# Patient Record
Sex: Male | Born: 1935 | Race: Asian | Hispanic: No | Smoking: Former smoker
Health system: Southern US, Community
[De-identification: ages and names within clinical notes are randomized; demographics above are authoritative.]

## PROBLEM LIST (undated history)

## (undated) DIAGNOSIS — N189 Chronic kidney disease, unspecified: Secondary | ICD-10-CM

## (undated) DIAGNOSIS — D649 Anemia, unspecified: Secondary | ICD-10-CM

## (undated) DIAGNOSIS — K922 Gastrointestinal hemorrhage, unspecified: Secondary | ICD-10-CM

## (undated) DIAGNOSIS — N400 Enlarged prostate without lower urinary tract symptoms: Secondary | ICD-10-CM

## (undated) HISTORY — PX: OTHER SURGICAL HISTORY: SHX169

---

## 2013-01-25 ENCOUNTER — Encounter (HOSPITAL_COMMUNITY): Payer: Self-pay

## 2013-01-25 ENCOUNTER — Inpatient Hospital Stay (HOSPITAL_COMMUNITY): Payer: Medicaid Other

## 2013-01-25 ENCOUNTER — Inpatient Hospital Stay (HOSPITAL_COMMUNITY)
Admission: EM | Admit: 2013-01-25 | Discharge: 2013-01-30 | DRG: 683 | Disposition: A | Discharge status: Home Health | Payer: Medicaid Other | Attending: Family Medicine | Admitting: Family Medicine

## 2013-01-25 ENCOUNTER — Emergency Department (HOSPITAL_COMMUNITY): Payer: Medicaid Other

## 2013-01-25 DIAGNOSIS — R531 Weakness: Secondary | ICD-10-CM | POA: Diagnosis present

## 2013-01-25 DIAGNOSIS — R509 Fever, unspecified: Secondary | ICD-10-CM

## 2013-01-25 DIAGNOSIS — N10 Acute tubulo-interstitial nephritis: Secondary | ICD-10-CM | POA: Diagnosis present

## 2013-01-25 DIAGNOSIS — D649 Anemia, unspecified: Secondary | ICD-10-CM

## 2013-01-25 DIAGNOSIS — E538 Deficiency of other specified B group vitamins: Secondary | ICD-10-CM | POA: Diagnosis present

## 2013-01-25 DIAGNOSIS — N179 Acute kidney failure, unspecified: Secondary | ICD-10-CM

## 2013-01-25 DIAGNOSIS — D696 Thrombocytopenia, unspecified: Secondary | ICD-10-CM

## 2013-01-25 DIAGNOSIS — D509 Iron deficiency anemia, unspecified: Secondary | ICD-10-CM | POA: Diagnosis present

## 2013-01-25 DIAGNOSIS — N1330 Unspecified hydronephrosis: Secondary | ICD-10-CM | POA: Diagnosis present

## 2013-01-25 DIAGNOSIS — N138 Other obstructive and reflux uropathy: Secondary | ICD-10-CM | POA: Diagnosis present

## 2013-01-25 DIAGNOSIS — K819 Cholecystitis, unspecified: Secondary | ICD-10-CM | POA: Diagnosis present

## 2013-01-25 DIAGNOSIS — N289 Disorder of kidney and ureter, unspecified: Secondary | ICD-10-CM

## 2013-01-25 DIAGNOSIS — R339 Retention of urine, unspecified: Secondary | ICD-10-CM

## 2013-01-25 DIAGNOSIS — N320 Bladder-neck obstruction: Secondary | ICD-10-CM | POA: Diagnosis present

## 2013-01-25 DIAGNOSIS — F17200 Nicotine dependence, unspecified, uncomplicated: Secondary | ICD-10-CM | POA: Diagnosis present

## 2013-01-25 DIAGNOSIS — E872 Acidosis, unspecified: Secondary | ICD-10-CM

## 2013-01-25 DIAGNOSIS — Z79899 Other long term (current) drug therapy: Secondary | ICD-10-CM

## 2013-01-25 DIAGNOSIS — N390 Urinary tract infection, site not specified: Secondary | ICD-10-CM

## 2013-01-25 DIAGNOSIS — N12 Tubulo-interstitial nephritis, not specified as acute or chronic: Secondary | ICD-10-CM

## 2013-01-25 DIAGNOSIS — N189 Chronic kidney disease, unspecified: Secondary | ICD-10-CM | POA: Diagnosis present

## 2013-01-25 DIAGNOSIS — N401 Enlarged prostate with lower urinary tract symptoms: Secondary | ICD-10-CM | POA: Diagnosis present

## 2013-01-25 HISTORY — DX: Benign prostatic hyperplasia without lower urinary tract symptoms: N40.0

## 2013-01-25 LAB — URINALYSIS, ROUTINE W REFLEX MICROSCOPIC
Bilirubin Urine: NEGATIVE
Nitrite: POSITIVE — AB
Protein, ur: >300 mg/dL — AB
Urobilinogen, UA: 0.2 mg/dL (ref 0.0–1.0)

## 2013-01-25 LAB — CBC WITH DIFFERENTIAL/PLATELET
Basophils Absolute: 0 10*3/uL (ref 0.0–0.1)
Eosinophils Relative: 1 % (ref 0–5)
HCT: 27.9 % — ABNORMAL LOW (ref 39.0–52.0)
Hemoglobin: 9.4 g/dL — ABNORMAL LOW (ref 13.0–17.0)
Lymphocytes Relative: 6 % — ABNORMAL LOW (ref 12–46)
Lymphs Abs: 0.2 10*3/uL — ABNORMAL LOW (ref 0.7–4.0)
MCV: 90.3 fL (ref 78.0–100.0)
Monocytes Absolute: 0 10*3/uL — ABNORMAL LOW (ref 0.1–1.0)
Monocytes Relative: 1 % — ABNORMAL LOW (ref 3–12)
Neutro Abs: 3.2 10*3/uL (ref 1.7–7.7)
RBC: 3.09 MIL/uL — ABNORMAL LOW (ref 4.22–5.81)
RDW: 13.6 % (ref 11.5–15.5)
WBC: 3.4 10*3/uL — ABNORMAL LOW (ref 4.0–10.5)

## 2013-01-25 LAB — IRON AND TIBC: Iron: <10 ug/dL — ABNORMAL LOW (ref 42–135)

## 2013-01-25 LAB — VITAMIN B12: Vitamin B-12: 251 pg/mL (ref 211–911)

## 2013-01-25 LAB — COMPREHENSIVE METABOLIC PANEL
AST: 20 U/L (ref 0–37)
CO2: 15 mEq/L — ABNORMAL LOW (ref 19–32)
Calcium: 7.9 mg/dL — ABNORMAL LOW (ref 8.4–10.5)
Chloride: 113 mEq/L — ABNORMAL HIGH (ref 96–112)
Creatinine, Ser: 2.21 mg/dL — ABNORMAL HIGH (ref 0.50–1.35)
GFR calc Af Amer: 30 mL/min — ABNORMAL LOW (ref >90)
GFR calc non Af Amer: 26 mL/min — ABNORMAL LOW (ref >90)
Glucose, Bld: 76 mg/dL (ref 70–99)
Total Bilirubin: 0.5 mg/dL (ref 0.3–1.2)

## 2013-01-25 LAB — FOLATE: Folate: 7.6 ng/mL

## 2013-01-25 LAB — RETICULOCYTES: Retic Count, Absolute: 35.6 10*3/uL (ref 19.0–186.0)

## 2013-01-25 LAB — URINE MICROSCOPIC-ADD ON

## 2013-01-25 LAB — FERRITIN: Ferritin: 57 ng/mL (ref 22–322)

## 2013-01-25 MED ORDER — ACETAMINOPHEN 325 MG PO TABS
650.0000 mg | ORAL_TABLET | Freq: Four times a day (QID) | ORAL | Status: DC | PRN
  Filled 2013-01-25 (×3): qty 2

## 2013-01-25 MED ORDER — OXYCODONE HCL 5 MG PO TABS
5.0000 mg | ORAL_TABLET | ORAL | Status: DC | PRN
  Administered 2013-01-26 – 2013-01-27 (×3): 5 mg via ORAL
  Filled 2013-01-25 (×2): qty 1
  Filled 2013-01-25: qty 2
  Filled 2013-01-25: qty 1

## 2013-01-25 MED ORDER — SODIUM CHLORIDE 0.9 % IV BOLUS (SEPSIS)
1000.0000 mL | Freq: Once | INTRAVENOUS | Status: AC
  Administered 2013-01-25: 1000 mL via INTRAVENOUS

## 2013-01-25 MED ORDER — ACETAMINOPHEN 325 MG PO TABS
650.0000 mg | ORAL_TABLET | ORAL | Status: DC | PRN
  Administered 2013-01-26 – 2013-01-29 (×4): 650 mg via ORAL
  Filled 2013-01-25 (×2): qty 2

## 2013-01-25 MED ORDER — SODIUM CHLORIDE 0.9 % IV SOLN
INTRAVENOUS | Status: DC
  Administered 2013-01-25 – 2013-01-28 (×6): via INTRAVENOUS

## 2013-01-25 MED ORDER — ONDANSETRON HCL 4 MG/2ML IJ SOLN
4.0000 mg | Freq: Once | INTRAMUSCULAR | Status: AC
  Administered 2013-01-25: 4 mg via INTRAVENOUS
  Filled 2013-01-25: qty 2

## 2013-01-25 MED ORDER — ONDANSETRON HCL 4 MG PO TABS: 4.0000 mg | ORAL_TABLET | Freq: Four times a day (QID) | ORAL | Status: DC | PRN

## 2013-01-25 MED ORDER — SODIUM CHLORIDE 0.9 % IV SOLN
Freq: Once | INTRAVENOUS | Status: AC
  Administered 2013-01-25: 05:00:00 via INTRAVENOUS

## 2013-01-25 MED ORDER — SODIUM CHLORIDE 0.9 % IV SOLN: INTRAVENOUS | Status: AC

## 2013-01-25 MED ORDER — DEXTROSE 5 % IV SOLN
1.0000 g | INTRAVENOUS | Status: DC
  Administered 2013-01-25 – 2013-01-26 (×2): 1 g via INTRAVENOUS
  Filled 2013-01-25 (×2): qty 10

## 2013-01-25 MED ORDER — ALUM & MAG HYDROXIDE-SIMETH 200-200-20 MG/5ML PO SUSP: 30.0000 mL | Freq: Four times a day (QID) | ORAL | Status: DC | PRN

## 2013-01-25 MED ORDER — ACETAMINOPHEN 650 MG RE SUPP: 650.0000 mg | Freq: Four times a day (QID) | RECTAL | Status: DC | PRN

## 2013-01-25 MED ORDER — IBUPROFEN 400 MG PO TABS
400.0000 mg | ORAL_TABLET | ORAL | Status: DC | PRN
  Filled 2013-01-25: qty 1

## 2013-01-25 MED ORDER — DEXTROSE 5 % IV SOLN: 1.0000 g | INTRAVENOUS | Status: DC

## 2013-01-25 MED ORDER — IBUPROFEN 400 MG PO TABS
400.0000 mg | ORAL_TABLET | Freq: Once | ORAL | Status: AC
  Administered 2013-01-25: 400 mg via ORAL
  Filled 2013-01-25: qty 1

## 2013-01-25 MED ORDER — HYDROMORPHONE HCL PF 1 MG/ML IJ SOLN: 0.5000 mg | INTRAMUSCULAR | Status: DC | PRN

## 2013-01-25 MED ORDER — ONDANSETRON HCL 4 MG/2ML IJ SOLN: 4.0000 mg | Freq: Four times a day (QID) | INTRAMUSCULAR | Status: DC | PRN

## 2013-01-25 MED ORDER — ZOLPIDEM TARTRATE 5 MG PO TABS: 5.0000 mg | ORAL_TABLET | Freq: Every evening | ORAL | Status: DC | PRN

## 2013-01-25 MED ORDER — ENOXAPARIN SODIUM 30 MG/0.3ML ~~LOC~~ SOLN
30.0000 mg | SUBCUTANEOUS | Status: DC
  Administered 2013-01-25 – 2013-01-29 (×5): 30 mg via SUBCUTANEOUS
  Filled 2013-01-25 (×6): qty 0.3

## 2013-01-25 NOTE — Progress Notes (Signed)
[facility] PROGRESS NOTE  Martin Tanner VHQ:[id number] DOB: Apr 24, 1928 DOA: 01/25/2013 PCP: Pcp Not In System  Assessment/Plan: Principal Problem:  *ARF (acute renal failure) Active Problems:  UTI (lower urinary tract infection)  Urinary retention  Anemia  Weakness generalized    1. ARF: Patient presented with increased lethargy, poor oral intake for a few days, followed by vomiting on 01/24/13. Creatinine was 2.2, BUN 38 at presentation, consistent with ARF, although baseline creatinine is unknown at this time. Perhaps, he does have underlying CKD, given known history of prostatism. Managing with iv fluids. Renal US to evaluate for obstructive uropathy, is pending. Avoiding nephrotoxins.  2. Acute Pyelonephritis/UTI: Patient has had a few days of fever, chills, and a temperature of 102.9 was documented in the ED. Urinalysis revealed a positive sediment, with pyuria and significant bacteriuria. Likely, patient has an acute pyelonephritis. On iv Rcephin, day#2. Urine cultures are pending. Will send off blood cultures.  3. Urinary Retention: Apparently, patient has had features of prostatism in the past, as well as an episode of urinary retention, requiring bladder catheterization about a year ago. Voiding with some hesitancy, since then. This is likely due to due to BPH. PSA is pending. Will consider Flomax if symptomatic, and referral to Urology as outpatient, if needed.  4. Anemia: This is mild-moderate normocytic. Anemia panel is pending, as is FOBT.  5. Generalized Weakness: Due to #s 1, 2 & 3.    Code Status: Full Code.  Family Communication:  Discussed fully with grandson t bedside, who also acted as Equities trader.  Disposition Plan: To be determined.    Brief narrative: 79 y.o. male who was brought to the ED by his family due to fevers, chills and low back pain x 2 days. He has had increased weakness and lethargy with decreased intake of foods and liquids. He also had nausea and vomiting  X 1 this am. The history was obtained from the family who were at the bedside, because the patient does not speak any english, he lived in [place] until 2 months ago. His family reports that he had kidney or bladder problems in the past and had to have a tube placed in his bladder so he could pass his urine, and at times he still has problems passing his urine. In the ED he was found to have a UTI, and also an elevated BUN and Cr 38/2.2. He had a fever to 102.9 in the ED, was placed on iv Rocephin and admitted for further management.     Consultants:  N/A.   Procedures:  CXR.   Antibiotics:  Rocephin 01/24/13>>>  HPI/Subjective: No new issues.   Objective: Vital signs in last 24 hours: Temp:  [98.4 F (36.9 C)-102.8 F (39.3 C)] 98.7 F (37.1 C) (02/02 0606) Pulse Rate:  [90-95] 90  (02/02 0606) Resp:  [19-24] 19  (02/02 0606) BP: (91-109)/(52-71) 91/52 mmHg (02/02 0606) SpO2:  [94 %-99 %] 99 % (02/02 0606) Weight:  [54 kg (119 lb 0.8 oz)-54.432 kg (120 lb)] 54 kg (119 lb 0.8 oz) (02/02 0606) Weight change:     Intake/Output from previous day:       Physical Exam: General: Comfortable, alert, communicative, fully oriented, not short of breath at rest.  HEENT:  Mild clinical pallor, no jaundice, no conjunctival injection or discharge. NECK:  Supple, JVP not seen, no carotid bruits, no palpable lymphadenopathy, no palpable goiter. CHEST:  Clinically clear to auscultation, no wheezes, no crackles. HEART:  Sounds 1 and 2 heard,  normal, regular, no murmurs. ABDOMEN:  Flat, soft, non-tender, no palpable organomegaly, no palpable masses, normal bowel sounds. GENITALIA:  Not examined. LOWER EXTREMITIES:  No pitting edema, palpable peripheral pulses. MUSCULOSKELETAL SYSTEM:  Generalized osteoarthritic changes, otherwise, normal. CENTRAL NERVOUS SYSTEM:  No focal neurologic deficit on gross examination.  Lab Results:  [facility] 01/25/13 0254  WBC 3.4*  HGB 9.4*  HCT 27.9*  PLT  121*    Basename 01/25/13 0254  NA 138  K 4.5  CL 113*  CO2 15*  GLUCOSE 76  BUN 38*  CREATININE 2.21*  CALCIUM 7.9*   No results found for this or any previous visit (from the past 240 hour(s)).   Studies/Results: Dg Chest 2 View  01/25/2013  *RADIOLOGY REPORT*  Clinical Data: Fever, emesis, low back pain for 2 days.  CHEST - 2 VIEW  Comparison: None.  Findings: Borderline heart size and pulmonary vascularity.  No focal airspace consolidation in the lungs.  Scattered interstitial changes likely representing fibrosis.  No blunting of costophrenic angles.  No pneumothorax.  Mediastinal contours appear intact. Calcification of the aorta.  Degenerative changes in the thoracic spine.  IMPRESSION: Borderline heart size and pulmonary vascularity.  No focal consolidation.   Original Report Authenticated By: Burman Nieves, M.D.     Medications: Scheduled Meds:   . sodium chloride   Intravenous STAT  . cefTRIAXone (ROCEPHIN) IVPB 1 gram/50 mL D5W  1 g Intravenous Q24H  . enoxaparin (LOVENOX) injection  30 mg Subcutaneous Q24H   Continuous Infusions:   . sodium chloride     PRN Meds:.acetaminophen, acetaminophen, acetaminophen, alum & mag hydroxide-simeth, HYDROmorphone (DILAUDID) injection, ibuprofen, ondansetron (ZOFRAN) IV, ondansetron, oxyCODONE, zolpidem    LOS: 0 days   Othello Dickenson,CHRISTOPHER  [facility] Pager [phone]. If 8PM-8AM, please contact night-coverage at [email or web address], password [facility] 01/25/2013, 7:49 AM  LOS: 0 days

## 2013-01-25 NOTE — Progress Notes (Signed)
Text placed to Dr. Conley Rolls, floor coverage about bilat hydronephrosis.

## 2013-01-25 NOTE — H&P (Signed)
[facility] History and Physical  Martin Tanner WUJ:[id number] DOB: 1928/09/14 DOA: 01/25/2013   Assessment/Plan: Present on Admission:  . ARF (acute renal failure) . UTI (lower urinary tract infection) . Urinary retention . Anemia . Weakness generalized   1.    ARF-  Acute versus Chronic, has a element of Prerenal,  Monitor BUN/Cr for improvement with hydration.   Renal US to evaluate for BOO, and hydronephrosis.     2.    UTI- Urine C+S sent, IV Rocephin for now, adjust PRN Culture Results.     3.    Urinary Retention-  Possibly due to BPH,   Check PSA, and consider Flomax if Blood Pressure permits, and referral tyo Urology as outpatient if needed.     4.    Anemia-  Send Anemia panel, and check FOBT daily X 3.       5.    Generalized Weakness-  Due to # 2, and # 1, should improve as illness resolves.     6.    Other-  DVT prophylaxis with Lovenox.       Referring physician: EDP PCP: Pcp Not In System  Specialists:   Chief Complaint: Weakness, Fever and Back Pain,  HPI: Martin Tanner is a 79 y.o. male who was brought to the ED by his family due to fevers and chills and low back pain x 2 days.   He has had increased weakness and lethargy with decreased intake of foods and liquids.  He also had nausea and vomiting X 1 this am.   The history was obtained from the family who are at the bedside because the patient does not speak any english, he lived in [place] until 2 months ago.   His family reports that he had kidney or bladder problems in the past and had to have a tube placed in his bladder so he could pass his urine, and at times he still has problems passing his urine.  In the ED he was found to have a UTI, and also an elevated BUN and Cr  38/2.2.   He had a fever to 102.9 in the ED, and he was placed on IV rocephin and referred for admission.          Review of Systems:    Positive for:   anorexia, fever, chills, nausea and vomiting X1, muscle weakness, urinary hesitation, urinary  retention The patient denies, weight loss, vision loss, decreased hearing, hoarseness, chest pain, syncope, dyspnea on exertion, peripheral edema, balance deficits, hemoptysis, abdominal pain, melena, hematochezia, severe indigestion/heartburn, hematuria, incontinence, genital sores, suspicious skin lesions, transient blindness, difficulty walking, depression, unusual weight change, abnormal bleeding, enlarged lymph nodes, angioedema, and breast masses.    Past Medical History  Diagnosis Date  . BPH (benign prostatic hyperplasia)    Past Surgical History  Procedure Date  . None     Medications:  HOME MEDS: Prior to Admission medications   Medication Sig Start Date End Date Taking? Authorizing Provider  acetaminophen (TYLENOL) 325 MG tablet Take 650 mg by mouth every 6 (six) hours as needed. For pain   Yes Historical Provider, MD    Allergies:  No Known Allergies  Social History:   reports that he has been smoking Cigarettes.  He has a 3.1 pack-year smoking history. He has never used smokeless tobacco. He reports that he does not drink alcohol or use illicit drugs.  Family History: Family History  Problem Relation Age of Onset  . Other  Negative CAD  . Other      Negative DM2  . Other      Negative HTN  . Other      Negative Cancer     Physical Exam:      GEN:  Pleasant Elderly 79 year old thin Elderly Asian Male examined  and in no acute distress; cooperative with exam Filed Vitals:   01/25/13 0225 01/25/13 0233 01/25/13 0248 01/25/13 0424  BP: 109/71   98/58  Pulse: 95   93  Temp: 102.8 F (39.3 C)   98.4 F (36.9 C)  TempSrc: Rectal   Oral  Resp: 20   24  Height:   4\' 9"  (1.448 m)   Weight:   54.432 kg (120 lb)   SpO2: 94% 95%  97%  Blood pressure 98/58, pulse 93, temperature 98.4 F (36.9 C), temperature source Oral, resp. rate 24, height 4\' 9"  (1.448 m), weight 54.432 kg (120 lb), SpO2 97.00%. PSYCH: He is alert and oriented x4; does not appear  anxious does not appear depressed; affect is normal HEENT: Normocephalic and Atraumatic, Mucous membranes pink; PERRLA; EOM intact; Fundi:  Benign;  No scleral icterus, Nares: Patent, Oropharynx: Clear, Poor Sparse Dentition, Neck:  FROM, no cervical lymphadenopathy nor thyromegaly or carotid bruit; no JVD; Breasts:: Not examined CHEST WALL: No tenderness CHEST: Normal respiration, clear to auscultation bilaterally HEART: Regular rate and rhythm; no murmurs rubs or gallops BACK: No kyphosis or scoliosis; no CVA tenderness ABDOMEN: Positive Bowel Sounds, Scaphoid,soft non-tender; no masses, no organomegaly.   Rectal Exam: Not done EXTREMITIES: No cyanosis, clubbing or edema; no ulcerations. Genitalia: not examined PULSES: 2+ and symmetric SKIN: Normal hydration no rash or ulceration CNS: Cranial nerves 2-12 grossly intact no focal neurologic deficit    Labs on Admission:  Basic Metabolic Panel:  Lab 01/25/13 1610  NA 138  K 4.5  CL 113*  CO2 15*  GLUCOSE 76  BUN 38*  CREATININE 2.21*  CALCIUM 7.9*  MG --  PHOS --   Liver Function Tests:  Lab 01/25/13 0254  AST 20  ALT 10  ALKPHOS 72  BILITOT 0.5  PROT 6.5  ALBUMIN 2.9*   No results found for this basename: LIPASE:5,AMYLASE:5 in the last 168 hours No results found for this basename: AMMONIA:5 in the last 168 hours CBC:  Lab 01/25/13 0254  WBC 3.4*  NEUTROABS 3.2  HGB 9.4*  HCT 27.9*  MCV 90.3  PLT 121*   Cardiac Enzymes: No results found for this basename: CKTOTAL:5,CKMB:5,CKMBINDEX:5,TROPONINI:5 in the last 168 hours  BNP (last 3 results) No results found for this basename: PROBNP:3 in the last 8760 hours CBG: No results found for this basename: GLUCAP:5 in the last 168 hours  Radiological Exams on Admission: Dg Chest 2 View  01/25/2013  *RADIOLOGY REPORT*  Clinical Data: Fever, emesis, low back pain for 2 days.  CHEST - 2 VIEW  Comparison: None.  Findings: Borderline heart size and pulmonary vascularity.   No focal airspace consolidation in the lungs.  Scattered interstitial changes likely representing fibrosis.  No blunting of costophrenic angles.  No pneumothorax.  Mediastinal contours appear intact. Calcification of the aorta.  Degenerative changes in the thoracic spine.  IMPRESSION: Borderline heart size and pulmonary vascularity.  No focal consolidation.   Original Report Authenticated By: Burman Nieves, M.D.      Assessment/Plan: Present on Admission:  . ARF (acute renal failure) . UTI (lower urinary tract infection) . Urinary retention . Anemia . Weakness generalized  1.    ARF-  Acute versus Chronic, has a element of Prerenal,  Monitor BUN/Cr for improvement with hydration.   Renal US to evaluate for BOO, and hydronephrosis.     2.    UTI- Urine C+S sent, IV Rocephin for now, adjust PRN Culture Results.     3.    Urinary Retention-  Possibly due to BPH,   Check PSA, and consider Flomax, and referral tyo Urology as outpatient if needed.     4.    Anemia-  Send Anemia panel, and check FOBT daily X 3.       5.    Generalized Weakness-  Due to # 2, and # 1, should improve as illness resolves.     6.    Other-  DVT prophylaxis with Lovenox.          Code Status:   FULL CODE Family Communication:  Family at Bedside Disposition Plan:   Return to Home on Discharge  Time spent:  44 Minutes  Ron Parker [facility] Pager [phone]  If 7PM-7AM, please contact night-coverage [email or web address] Password [facility] 01/25/2013, 5:39 AM

## 2013-01-25 NOTE — Progress Notes (Signed)
Dr. Andrey Campanile from radiology called and stated pt has bilat. Hydronephrosis.

## 2013-01-25 NOTE — ED Notes (Signed)
Per EMS:  The patient c/o flulike symptoms and fever x 24 hours.  He also c/o lower back pain yesterday.  EMS gave Zofran 4 mg, iv and NS , iv en route, and the patient took acetaminophen per the directions on the box at home at 0130.

## 2013-01-25 NOTE — Progress Notes (Signed)
Nocturnist:  Called about [place] with bilateral hydronephrosis.  Chart reviewed, admitted for pyelo, and in ARF Cr 2.2 unknown baseline.  I spoke with Dr Desmond Dike, who reviewed the chart as well, and feel that its likely an obtructive process below the bladder.  He did have BPH prior.  He recommended an 18 Fr size foley to be placed, and a NON CONTRAST abd pelvic CT.  He will see patient in the morning for further recommendation.  I spoke with Dr Brien Few as well tonight.      Dr Conley Rolls.

## 2013-01-25 NOTE — ED Provider Notes (Signed)
History     CSN: [id number]  Arrival date & time 01/25/13  0213   First MD Initiated Contact with Patient 01/25/13 [phone]      Chief Complaint  Patient presents with  . Flulike Symptoms   . Fever    (Consider location/radiation/quality/duration/timing/severity/associated sxs/prior treatment) Patient is a 79 y.o. male presenting with fever. The history is provided by the patient and a relative.  Fever Primary symptoms of the febrile illness include fever.  He had onset yesterday of fever and chills. He did not take his temperature at home. There has been a cough productive of sputum but he did not notice color. There's been nausea and vomiting as well as post tussive emesis. There's been no diarrhea. He denies rhinorrhea or sore throat. He denies arthralgias and myalgias although it had some mild lower back pain yesterday. He did take acetaminophen for fever. He is feeling generally weak. There's been no sick contacts. He did not receive an influenza vaccination this season.  No past medical history on file.  No past surgical history on file.  No family history on file.  History  Substance Use Topics  . Smoking status: Current Every Day Smoker -- 0.0 packs/day for 62 years    Types: Cigarettes  . Smokeless tobacco: Never Used  . Alcohol Use: No      Review of Systems  Constitutional: Positive for fever.  All other systems reviewed and are negative.    Allergies  Review of patient's allergies indicates no known allergies.  Home Medications  No current outpatient prescriptions on file.  BP 109/71  Pulse 95  Temp 102.8 F (39.3 C) (Rectal)  Resp 20  SpO2 95%  Physical Exam  Nursing note and vitals reviewed.  79 year old male, resting comfortably and in no acute distress. Vital signs are significant for fever with temperature 102.8. Oxygen saturation is 95%, which is normal. Head is normocephalic and atraumatic. PERRLA, EOMI. Oropharynx is clear. Neck is nontender  and supple without adenopathy or JVD. Back is nontender and there is no CVA tenderness. Lungs are clear without rales, wheezes, or rhonchi. Chest is nontender. Heart has regular rate and rhythm without murmur. Abdomen is soft, flat, nontender without masses or hepatosplenomegaly and peristalsis is normoactive. Extremities have no cyanosis or edema, full range of motion is present. Skin is warm and dry without rash. Neurologic: Mental status is normal, cranial nerves are intact, there are no motor or sensory deficits.  ED Course  Procedures (including critical care time)  Results for orders placed during the hospital encounter of 01/25/13  CBC WITH DIFFERENTIAL      Component Value Range   WBC 3.4 (*) 4.0 - 10.5 K/uL   RBC 3.09 (*) 4.22 - 5.81 MIL/uL   Hemoglobin 9.4 (*) 13.0 - 17.0 g/dL   HCT 11.9 (*) 14.7 - 82.9 %   MCV 90.3  78.0 - 100.0 fL   MCH 30.4  26.0 - 34.0 pg   MCHC 33.7  30.0 - 36.0 g/dL   RDW 56.2  13.0 - 86.5 %   Platelets 121 (*) 150 - 400 K/uL   Neutrophils Relative 92 (*) 43 - 77 %   Neutro Abs 3.2  1.7 - 7.7 K/uL   Lymphocytes Relative 6 (*) 12 - 46 %   Lymphs Abs 0.2 (*) 0.7 - 4.0 K/uL   Monocytes Relative 1 (*) 3 - 12 %   Monocytes Absolute 0.0 (*) 0.1 - 1.0 K/uL   Eosinophils Relative  1  0 - 5 %   Eosinophils Absolute 0.0  0.0 - 0.7 K/uL   Basophils Relative 0  0 - 1 %   Basophils Absolute 0.0  0.0 - 0.1 K/uL  COMPREHENSIVE METABOLIC PANEL      Component Value Range   Sodium 138  135 - 145 mEq/L   Potassium 4.5  3.5 - 5.1 mEq/L   Chloride 113 (*) 96 - 112 mEq/L   CO2 15 (*) 19 - 32 mEq/L   Glucose, Bld 76  70 - 99 mg/dL   BUN 38 (*) 6 - 23 mg/dL   Creatinine, Ser 1.61 (*) 0.50 - 1.35 mg/dL   Calcium 7.9 (*) 8.4 - 10.5 mg/dL   Total Protein 6.5  6.0 - 8.3 g/dL   Albumin 2.9 (*) 3.5 - 5.2 g/dL   AST 20  0 - 37 U/L   ALT 10  0 - 53 U/L   Alkaline Phosphatase 72  39 - 117 U/L   Total Bilirubin 0.5  0.3 - 1.2 mg/dL   GFR calc non Af Amer 26 (*) >90  mL/min   GFR calc Af Amer 30 (*) >90 mL/min  URINALYSIS, ROUTINE W REFLEX MICROSCOPIC      Component Value Range   Color, Urine YELLOW  YELLOW   APPearance TURBID (*) CLEAR   Specific Gravity, Urine 1.021  1.005 - 1.030   pH 5.5  5.0 - 8.0   Glucose, UA NEGATIVE  NEGATIVE mg/dL   Hgb urine dipstick LARGE (*) NEGATIVE   Bilirubin Urine NEGATIVE  NEGATIVE   Ketones, ur NEGATIVE  NEGATIVE mg/dL   Protein, ur >096 (*) NEGATIVE mg/dL   Urobilinogen, UA 0.2  0.0 - 1.0 mg/dL   Nitrite POSITIVE (*) NEGATIVE   Leukocytes, UA LARGE (*) NEGATIVE  URINE MICROSCOPIC-ADD ON      Component Value Range   Squamous Epithelial / LPF RARE  RARE   WBC, UA TOO NUMEROUS TO COUNT  <3 WBC/hpf   RBC / HPF 21-50  <3 RBC/hpf   Bacteria, UA MANY (*) RARE   Dg Chest 2 View  01/25/2013  *RADIOLOGY REPORT*  Clinical Data: Fever, emesis, low back pain for 2 days.  CHEST - 2 VIEW  Comparison: None.  Findings: Borderline heart size and pulmonary vascularity.  No focal airspace consolidation in the lungs.  Scattered interstitial changes likely representing fibrosis.  No blunting of costophrenic angles.  No pneumothorax.  Mediastinal contours appear intact. Calcification of the aorta.  Degenerative changes in the thoracic spine.  IMPRESSION: Borderline heart size and pulmonary vascularity.  No focal consolidation.   Original Report Authenticated By: Burman Nieves, M.D.       1. Fever   2. Urinary tract infection   3. Renal insufficiency   4. Anemia   5. Thrombocytopenia       MDM  Fever which may be influenza, may be pneumonia, may be urinary tract infection. Workup has been initiated and will be given some IV fluid as well as ibuprofen for fever.  Workup shows no evidence of pneumonia and definite evidence of urinary tract infection. In addition, he has mild thrombocytopenia and mild to moderate renal insufficiency as well as moderate anemia. Have explained these to his family states that he did have some kind  of kidney problem while in the polyp but they don't know the exact nature of it and the description sounds more like prostatic hypertrophy. He is started on ceftriaxone for his urinary infection and he  will be admitted for hydration and observation repeat laboratory testing. Case is discussed with Dr. Lovell Sheehan of [facility] who agrees to admit the patient under observation status.      Dione Booze, MD 01/25/13 [phone]

## 2013-01-25 NOTE — ED Notes (Signed)
Pt and family advised we need urine from pt.  Pt unable to void at this time.  RN at bedside giving fluid bolus.  Will attempt to get UA at later time.

## 2013-01-26 DIAGNOSIS — R339 Retention of urine, unspecified: Secondary | ICD-10-CM

## 2013-01-26 DIAGNOSIS — D649 Anemia, unspecified: Secondary | ICD-10-CM

## 2013-01-26 DIAGNOSIS — N390 Urinary tract infection, site not specified: Secondary | ICD-10-CM

## 2013-01-26 DIAGNOSIS — N179 Acute kidney failure, unspecified: Principal | ICD-10-CM

## 2013-01-26 LAB — CBC
Hemoglobin: 8.2 g/dL — ABNORMAL LOW (ref 13.0–17.0)
MCH: 30 pg (ref 26.0–34.0)
RBC: 2.73 MIL/uL — ABNORMAL LOW (ref 4.22–5.81)

## 2013-01-26 LAB — BASIC METABOLIC PANEL
GFR calc non Af Amer: 19 mL/min — ABNORMAL LOW (ref >90)
Glucose, Bld: 75 mg/dL (ref 70–99)
Potassium: 4.7 mEq/L (ref 3.5–5.1)
Sodium: 140 mEq/L (ref 135–145)

## 2013-01-26 LAB — PSA: PSA: 20.17 ng/mL — ABNORMAL HIGH (ref <=4.00)

## 2013-01-26 MED ORDER — BELLADONNA ALKALOIDS-OPIUM 16.2-60 MG RE SUPP
1.0000 | Freq: Four times a day (QID) | RECTAL | Status: DC | PRN
  Administered 2013-01-26: 1 via RECTAL
  Filled 2013-01-26 (×2): qty 1

## 2013-01-26 MED ORDER — FERROUS SULFATE 325 (65 FE) MG PO TABS
325.0000 mg | ORAL_TABLET | Freq: Two times a day (BID) | ORAL | Status: DC
  Administered 2013-01-26 – 2013-01-30 (×8): 325 mg via ORAL
  Filled 2013-01-26 (×10): qty 1

## 2013-01-26 MED ORDER — SODIUM CHLORIDE 0.9 % IV SOLN
250.0000 mg | Freq: Two times a day (BID) | INTRAVENOUS | Status: DC
  Administered 2013-01-26 – 2013-01-28 (×5): 250 mg via INTRAVENOUS
  Filled 2013-01-26 (×6): qty 250

## 2013-01-26 MED ORDER — VITAMIN B-12 100 MCG PO TABS
100.0000 ug | ORAL_TABLET | Freq: Every day | ORAL | Status: DC
  Administered 2013-01-26 – 2013-01-30 (×5): 100 ug via ORAL
  Filled 2013-01-26 (×5): qty 1

## 2013-01-26 MED ORDER — FOLIC ACID 1 MG PO TABS
1.0000 mg | ORAL_TABLET | Freq: Every day | ORAL | Status: DC
  Administered 2013-01-26 – 2013-01-30 (×5): 1 mg via ORAL
  Filled 2013-01-26 (×5): qty 1

## 2013-01-26 NOTE — Consult Note (Signed)
Urology Consult  CC: Hydronephrosis with renal failure  HPI: 79 year old Asian male presents with acute renal failure, generalized weakness and anemia. He he, on ultrasound, was found to have bilateral hydronephrosis. Additionally, his creatinine is 2.2. Urinalysis appeared infected. There has been noted in the patient's history issues with lower urinary tract symptoms before. The interview with the patient is quite difficult, even with an interpreter.  I was called by the hospital service last night, and recommended Foley catheter placement, as the renal ultrasound sound revealed sediment in the bladder.  On interview today, I cannot communicate with the patient, despite an interpreter. He seems uncomfortable with the catheter in. Urine is slightly bloody. Urinary volume last night was 600 cc.  PMH: Past Medical History  Diagnosis Date  . BPH (benign prostatic hyperplasia)     PSH: Past Surgical History  Procedure Date  . None     Allergies: No Known Allergies  Medications: Prescriptions prior to admission  Medication Sig Dispense Refill  . acetaminophen (TYLENOL) 325 MG tablet Take 650 mg by mouth every 6 (six) hours as needed. For pain         Social History: History   Social History  . Marital Status: Widowed    Spouse Name: N/A    Number of Children: N/A  . Years of Education: N/A   Occupational History  . Not on file.   Social History Main Topics  . Smoking status: Current Every Day Smoker -- 0.0 packs/day for 62 years    Types: Cigarettes  . Smokeless tobacco: Never Used  . Alcohol Use: No  . Drug Use: No  . Sexually Active: No   Other Topics Concern  . Not on file   Social History Narrative  . No narrative on file    Family History: Family History  Problem Relation Age of Onset  . Other      Negative CAD  . Other      Negative DM2  . Other      Negative HTN  . Other      Negative Cancer    Review of Systems: Review of systems is  unobtainable  Physical Exam: @VITALS2 @ General: He seems to be in mild distress. He is on the toilet..  Awake. Head:  Normocephalic.  Atraumatic.   Foley catheter was present in his penis. It was draining      Studies:  Recent Labs  [facility] 01/25/13 0254   HGB 9.4*   WBC 3.4*   PLT 121*    Recent Labs  [facility] 01/25/13 0254   NA 138   K 4.5   CL 113*   CO2 15*   BUN 38*   CREATININE 2.21*   CALCIUM 7.9*   GFRNONAA 26*   GFRAA 30*     No results found for this basename: PT:2,INR:2,APTT:2 in the last 72 hours   No components found with this basename: ABG:2    Assessment:    1.bilateral hydronephrosis with renal failure, most likely secondary to bladder outlet obstruction   2. Bladder outlet obstruction from BPH. I was unable to evaluate his prostate today  Plan: 1. I would leave the Foley catheter in for now.  2. He will probably eventually need a TURP  3. Treat UTI adequately  4. I have ordered a B&O suppository for bladder spasms-the patient seems like he is having them this morning, but I cannot communicate despite attempting to use an interpreter over the mobile phone.  Pager:[phone]

## 2013-01-26 NOTE — Progress Notes (Signed)
ANTIBIOTIC CONSULT NOTE - INITIAL  Pharmacy Consult for primaxin Indication: pyelonephritis  No Known Allergies  Patient Measurements: Height: 4\' 9"  (144.8 cm) Weight: 119 lb 0.8 oz (54 kg) IBW/kg (Calculated) : 43.1   Vital Signs: Temp: 98.5 F (36.9 C) (02/03 0626) Temp src: Oral (02/03 0626) BP: 96/53 mmHg (02/03 0626) Pulse Rate: 63  (02/03 0626) Intake/Output from previous day: 02/02 0701 - 02/03 0700 In: 2261 [P.O.:200; I.V.:2061] Out: 600 [Urine:600] Intake/Output from this shift:    Labs:  Basename 01/26/13 0615 01/25/13 0254  WBC 7.5 3.4*  HGB 8.2* 9.4*  PLT 103* 121*  LABCREA -- --  CREATININE 2.83* 2.21*   Estimated Creatinine Clearance: 13.3 ml/min (by C-G formula based on Cr of 2.83). No results found for this basename: VANCOTROUGH:2,VANCOPEAK:2,VANCORANDOM:2,GENTTROUGH:2,GENTPEAK:2,GENTRANDOM:2,TOBRATROUGH:2,TOBRAPEAK:2,TOBRARND:2,AMIKACINPEAK:2,AMIKACINTROU:2,AMIKACIN:2, in the last 72 hours   Microbiology: No results found for this or any previous visit (from the past 720 hour(s)).  Medical History: Past Medical History  Diagnosis Date  . BPH (benign prostatic hyperplasia)     Medications:  Prescriptions prior to admission  Medication Sig Dispense Refill  . acetaminophen (TYLENOL) 325 MG tablet Take 650 mg by mouth every 6 (six) hours as needed. For pain       Assessment: 36 yom presented to the hospital with weakness, fever and back pain. Noted to be in ARF and also with UTI so he was started on ceftriaxone. Urology noted need to eventual TURP. Now broadening coverage to primaxin. Pt has a Tmax of 102.9 and WBC WNL. Pts Scr has increased and CrCl <80ml/min.  Goal of Therapy:  Eradication of infection  Plan:  1. Primaxin 250mg  IV Q12H 2. F/u renal fxn, C&S, clinical status  Dilia Alemany, Drake Leach 01/26/2013,9:37 AM

## 2013-01-26 NOTE — Progress Notes (Signed)
[facility] PROGRESS NOTE  Martin Tanner ZOX:[id number] DOB: 04-11-1928 DOA: 01/25/2013 PCP: Pcp Not In System  Assessment/Plan: Principal Problem:  *ARF (acute renal failure) Active Problems:  UTI (lower urinary tract infection)  Urinary retention  Anemia  Weakness generalized    1. ARF: Patient presented with increased lethargy, poor oral intake for a few days, followed by vomiting on 01/24/13. Creatinine was 2.2, BUN 38 at presentation, consistent with ARF, although baseline creatinine is unknown at this time. Perhaps, he does have underlying CKD, given known history of prostatism. Renal US/Abdominal and pelvic CT scan, revealed bilateral hydronephrosis and hydroureter with hypertrophic changes in the bladder and enlarged prostate gland consistent with bladder outlet obstruction. No stone or mass identified. Managing with iv fluids. Dr Marcine Matar provided urology consultation, and has opined that patient will probably need TURP. Foley catheter has been placed, and patient has gross hematuria this morning.  2. Acute Pyelonephritis/UTI: Patient has had a few days of fever, chills, and a temperature of 102.9 was documented in the ED. Urinalysis revealed a positive sediment, with pyuria and significant bacteriuria. Likely, patient has an acute pyelonephritis. On iv Rcephin, day#3. Urine and blood cultures are negative so far. Patient spiked a temperature of 102.9 overnight. Will broaden coverage , by changing to Primaxin therapy.  3. Urinary Retention: Apparently, patient has had features of prostatism in the past, as well as an episode of urinary retention, requiring bladder catheterization about a year ago. Voiding with some hesitancy, since then. Based on imaging studies described above, this is due to BPH. PSA is pending. Managing per urology.  4. Anemia: This is mild-moderate normocytic. Anemia panel has revealed iron deficiency, as well as borderline B12 deficiency. This may be nutritional.  FOBT is pending. Will start iron and B12 supplementation.  5. Generalized Weakness: Due to #s 1, 2 & 3.    Code Status: Full Code.  Family Communication:  Discussed fully with grandson. Tel: [phone]. Disposition Plan: To be determined.    Brief narrative: 79 y.o. male who was brought to the ED by his family due to fevers, chills and low back pain x 2 days. He has had increased weakness and lethargy with decreased intake of foods and liquids. He also had nausea and vomiting X 1 this am. The history was obtained from the family who were at the bedside, because the patient does not speak any english, he lived in [place] until 2 months ago. His family reports that he had kidney or bladder problems in the past and had to have a tube placed in his bladder so he could pass his urine, and at times he still has problems passing his urine. In the ED he was found to have a UTI, and also an elevated BUN and Cr 38/2.2. He had a fever to 102.9 in the ED, was placed on iv Rocephin and admitted for further management.     Consultants:  N/A.   Procedures:  CXR.   Antibiotics:  Rocephin 01/24/13-01/26/13.  Primaxin 01/26/13>>>  HPI/Subjective: No new issues.   Objective: Vital signs in last 24 hours: Temp:  [98.3 F (36.8 C)-102.9 F (39.4 C)] 98.5 F (36.9 C) (02/03 0626) Pulse Rate:  [63-93] 63  (02/03 0626) Resp:  [18-20] 20  (02/03 0626) BP: (96-105)/(43-59) 96/53 mmHg (02/03 0626) SpO2:  [93 %-100 %] 95 % (02/03 0626) Weight change:  Last BM Date: 01/24/13  Intake/Output from previous day: 02/02 0701 - 02/03 0700 In: 2261 [P.O.:200; I.V.:2061] Out: 600 [Urine:600]  Physical Exam: General: Comfortable, alert, communicative, fully oriented, not short of breath at rest.  HEENT:  Mild clinical pallor, no jaundice, no conjunctival injection or discharge. NECK:  Supple, JVP not seen, no carotid bruits, no palpable lymphadenopathy, no palpable goiter. CHEST:  Clinically clear to  auscultation, no wheezes, no crackles. HEART:  Sounds 1 and 2 heard, normal, regular, no murmurs. ABDOMEN:  Flat, soft, non-tender, no palpable organomegaly, no palpable masses, normal bowel sounds. GENITALIA:  Not examined. LOWER EXTREMITIES:  No pitting edema, palpable peripheral pulses. MUSCULOSKELETAL SYSTEM:  Generalized osteoarthritic changes, otherwise, normal. CENTRAL NERVOUS SYSTEM:  No focal neurologic deficit on gross examination.  Lab Results:  Basename 01/26/13 0615 01/25/13 0254  WBC 7.5 3.4*  HGB 8.2* 9.4*  HCT 24.5* 27.9*  PLT 103* 121*    Basename 01/26/13 0615 01/25/13 0254  NA 140 138  K 4.7 4.5  CL 117* 113*  CO2 15* 15*  GLUCOSE 75 76  BUN 40* 38*  CREATININE 2.83* 2.21*  CALCIUM 7.4* 7.9*   No results found for this or any previous visit (from the past 240 hour(s)).   Studies/Results: Ct Abdomen Pelvis Wo Contrast  01/25/2013  *RADIOLOGY REPORT*  Clinical Data: BPH.  Obstructive process below the bladder. Bilateral hydronephrosis on renal ultrasound.  CT ABDOMEN AND PELVIS WITHOUT CONTRAST  Technique:  Multidetector CT imaging of the abdomen and pelvis was performed following the standard protocol without intravenous contrast.  Comparison: Renal ultrasound 01/25/2013.  Findings: There are small bilateral pleural effusions with infiltration or atelectasis in both lung bases.  Interstitial thickening suggesting edema or fibrosis.  Mild cardiac enlargement.  There is marked bilateral renal hydronephrosis and hydroureter.  No renal or ureteral stones are demonstrated.  The prostate gland is enlarged, measuring 4.8 x 4.9 cm.  The bladder wall is diffusely thickened with bladder wall trabeculation and small bladder diverticula.  Changes suggest bladder outlet obstruction.  Large stone in the gallbladder with mild pericholecystic edema. Changes are nonspecific but could be associated with cholecystitis in the appropriate clinical setting.  Clinical correlation is  recommended.  There is mild edema or infiltration in the pericolic gutters, greater on the right with hazy edema in the mesentery.  No significant ascites.  The unenhanced appearance of the liver, spleen, pancreas, adrenal glands, abdominal aorta, and retroperitoneal lymph nodes is unremarkable.  The stomach, small bowel, and colon are not abnormally distended.  No free air in the abdomen.  Vascular calcifications.  Pelvis:  No free or loculated pelvic fluid collections.  No significant pelvic lymphadenopathy.  No inflammatory changes.  No evidence of diverticulitis.  The appendix is not identified. Degenerative changes in the lumbar spine with normal alignment. Spondylolysis at L5 bilaterally.  IMPRESSION: Bilateral hydronephrosis and hydroureter with hypertrophic changes in the bladder and enlarged prostate gland consistent with bladder outlet obstruction.  No stone or mass identified.  Bilateral pleural effusions with basilar atelectasis or infiltration. Cholelithiasis with mild pericholecystic edema is nonspecific but cholecystitis should be excluded clinically.   Original Report Authenticated By: Burman Nieves, M.D.    Dg Chest 2 View  01/25/2013  *RADIOLOGY REPORT*  Clinical Data: Fever, emesis, low back pain for 2 days.  CHEST - 2 VIEW  Comparison: None.  Findings: Borderline heart size and pulmonary vascularity.  No focal airspace consolidation in the lungs.  Scattered interstitial changes likely representing fibrosis.  No blunting of costophrenic angles.  No pneumothorax.  Mediastinal contours appear intact. Calcification of the aorta.  Degenerative changes in the  thoracic spine.  IMPRESSION: Borderline heart size and pulmonary vascularity.  No focal consolidation.   Original Report Authenticated By: Burman Nieves, M.D.    US Renal  01/25/2013  *RADIOLOGY REPORT*  Clinical Data: Elevated BUN and creatinine.  RENAL/URINARY TRACT ULTRASOUND COMPLETE  Comparison:  None.  Findings:  Right Kidney:  11.3  cm. Moderate to marked hydronephrosis.  1 cm superior cyst.  Left Kidney:  12.2 cm.  Moderate to marked hydronephrosis.  1.2 cm upper pole cyst.  Bladder:  Irregular thickened wall containing debris.  2.1 cm gallstone noted.  Prostate gland measures up to 5 cm.  IMPRESSION: Moderate to marked bilateral hydronephrosis. Cause of hydronephrosis is indeterminate.  Please see above.  Critical Value/emergent results were called by telephone at the time of interpretation on 01/25/2013  at 7:35 p.m. to [facility] the patient's nurse, who verbally acknowledged these results.   Original Report Authenticated By: Lacy Duverney, M.D.     Medications: Scheduled Meds:    . sodium chloride   Intravenous STAT  . cefTRIAXone (ROCEPHIN) IVPB 1 gram/50 mL D5W  1 g Intravenous Q24H  . enoxaparin (LOVENOX) injection  30 mg Subcutaneous Q24H   Continuous Infusions:    . sodium chloride 100 mL/hr at 01/26/13 0630   PRN Meds:.acetaminophen, acetaminophen, acetaminophen, alum & mag hydroxide-simeth, HYDROmorphone (DILAUDID) injection, ondansetron (ZOFRAN) IV, ondansetron, opium-belladonna, oxyCODONE, zolpidem    LOS: 1 day   Bocephus Cali,CHRISTOPHER  [facility] Pager [phone]. If 8PM-8AM, please contact night-coverage at [email or web address], password [facility] 01/26/2013, 9:09 AM  LOS: 1 day

## 2013-01-27 LAB — URINE CULTURE: Colony Count: >=100000

## 2013-01-27 LAB — BASIC METABOLIC PANEL
CO2: 13 mEq/L — ABNORMAL LOW (ref 19–32)
Chloride: 115 mEq/L — ABNORMAL HIGH (ref 96–112)
Creatinine, Ser: 2.78 mg/dL — ABNORMAL HIGH (ref 0.50–1.35)
GFR calc Af Amer: 23 mL/min — ABNORMAL LOW (ref >90)
Potassium: 4.8 mEq/L (ref 3.5–5.1)
Sodium: 140 mEq/L (ref 135–145)

## 2013-01-27 LAB — CBC
Platelets: 117 10*3/uL — ABNORMAL LOW (ref 150–400)
RBC: 2.7 MIL/uL — ABNORMAL LOW (ref 4.22–5.81)
RDW: 14.3 % (ref 11.5–15.5)
WBC: 6.9 10*3/uL (ref 4.0–10.5)

## 2013-01-27 LAB — OCCULT BLOOD X 1 CARD TO LAB, STOOL: Fecal Occult Bld: NEGATIVE

## 2013-01-27 NOTE — Progress Notes (Signed)
[facility] PROGRESS NOTE  Martin Tanner RUE:[id number] DOB: Nov 24, 1928 DOA: 01/25/2013 PCP: Pcp Not In System  Assessment/Plan: Principal Problem:  *ARF (acute renal failure) Active Problems:  UTI (lower urinary tract infection)  Urinary retention  Anemia  Weakness generalized    1. ARF: Patient presented with increased lethargy, poor oral intake for a few days, followed by vomiting on 01/24/13. Creatinine was 2.2, BUN 38 at presentation, consistent with ARF, although baseline creatinine is unknown at this time. Perhaps, he does have underlying CKD, given known history of prostatism. Renal US/Abdominal and pelvic CT scan, revealed bilateral hydronephrosis and hydroureter with hypertrophic changes in the bladder and enlarged prostate gland consistent with bladder outlet obstruction. No stone or mass identified. Managing with iv fluids. Dr Marcine Matar provided urology consultation, and has opined that patient will probably need TURP. Foley catheter has been placed, and patient had gross hematuria on 01/26/13, but this has improved.  2. Acute Pyelonephritis/UTI: Patient has had a few days of fever, chills, and a temperature of 102.9 was documented in the ED. Urinalysis revealed a positive sediment, with pyuria and significant bacteriuria. Likely, patient has an acute pyelonephritis. Treated initially with iv Rocephin, but changed to iv Primaxin on 01/26/13, as patient was spiking up to 102.9 on that day. Afebrile today. Urine culture grew E. Coli. Blood cultures are negative so far.  3. Urinary Retention: Apparently, patient has had features of prostatism in the past, as well as an episode of urinary retention, requiring bladder catheterization about a year ago. Voiding with some hesitancy, since then. Based on imaging studies described above, this is due to prostatic enlargement. PSA is 20.17. Managing per urology.  4. Anemia: This is mild-moderate normocytic. Anemia panel has revealed iron  deficiency, as well as borderline B12 deficiency. This may be nutritional. FOBT is pending. Started on iron and B12 supplementation.  5. Generalized Weakness: Due to #s 1, 2 & 3.    Code Status: Full Code.  Family Communication:  Discussed fully with grandson. Tel: [phone]. Patient has a granddaughter, Emelia Loron, who has an excellent command of english and will be available in the mornings, in hospital.  Disposition Plan: To be determined.    Brief narrative: 79 y.o. male who was brought to the ED by his family due to fevers, chills and low back pain x 2 days. He has had increased weakness and lethargy with decreased intake of foods and liquids. He also had nausea and vomiting X 1 this am. The history was obtained from the family who were at the bedside, because the patient does not speak any english, he lived in [place] until 2 months ago. His family reports that he had kidney or bladder problems in the past and had to have a tube placed in his bladder so he could pass his urine, and at times he still has problems passing his urine. In the ED he was found to have a UTI, and also an elevated BUN and Cr 38/2.2. He had a fever to 102.9 in the ED, was placed on iv Rocephin and admitted for further management.     Consultants:  N/A.   Procedures:  CXR.   Antibiotics:  Rocephin 01/24/13-01/26/13.  Primaxin 01/26/13>>>  HPI/Subjective: Still has poor appetite, but feels a bit better.   Objective: Vital signs in last 24 hours: Temp:  [98.7 F (37.1 C)-102.4 F (39.1 C)] 98.7 F (37.1 C) (02/04 0954) Pulse Rate:  [89-100] 91  (02/04 0954) Resp:  [16-18] 18  (02/04  0954) BP: (100-135)/(54-72) 135/72 mmHg (02/04 0954) SpO2:  [92 %-100 %] 95 % (02/04 0954) Weight change:  Last BM Date: 01/26/13  Intake/Output from previous day: 02/03 0701 - 02/04 0700 In: 2931.7 [P.O.:540; I.V.:2291.7; IV Piggyback:100] Out: 2050 [Urine:2050] Total I/O In: 240 [P.O.:240] Out: 475  [Urine:475]   Physical Exam: General: Comfortable, alert, communicative, fully oriented, not short of breath at rest.  HEENT:  Mild clinical pallor, no jaundice, no conjunctival injection or discharge. NECK:  Supple, JVP not seen, no carotid bruits, no palpable lymphadenopathy, no palpable goiter. CHEST:  Clinically clear to auscultation, no wheezes, no crackles. HEART:  Sounds 1 and 2 heard, normal, regular, no murmurs. ABDOMEN:  Flat, soft, non-tender, no palpable organomegaly, no palpable masses, normal bowel sounds. GENITALIA:  Not examined. LOWER EXTREMITIES:  No pitting edema, palpable peripheral pulses. MUSCULOSKELETAL SYSTEM:  Generalized osteoarthritic changes, otherwise, normal. CENTRAL NERVOUS SYSTEM:  No focal neurologic deficit on gross examination.  Lab Results:  [facility] 01/27/13 0525 01/26/13 0615  WBC 6.9 7.5  HGB 8.1* 8.2*  HCT 24.4* 24.5*  PLT 117* 103*    Basename 01/27/13 0525 01/26/13 0615  NA 140 140  K 4.8 4.7  CL 115* 117*  CO2 13* 15*  GLUCOSE 65* 75  BUN 35* 40*  CREATININE 2.78* 2.83*  CALCIUM 7.8* 7.4*   Recent Results (from the past 240 hour(s))  URINE CULTURE     Status: Normal   Collection Time   01/25/13  3:10 AM      Component Value Range Status Comment   Specimen Description URINE, CLEAN CATCH   Final    Special Requests CX ADDED AT 0337 ON [id number]   Final    Culture  Setup Time 01/25/2013 15:00   Final    Colony Count >=100,000 COLONIES/ML   Final    Culture ESCHERICHIA COLI   Final    Report Status 01/27/2013 FINAL   Final    Organism ID, Bacteria ESCHERICHIA COLI   Final   CULTURE, BLOOD (ROUTINE X 2)     Status: Normal (Preliminary result)   Collection Time   01/25/13  9:00 AM      Component Value Range Status Comment   Specimen Description BLOOD RIGHT ARM   Final    Special Requests BOTTLES DRAWN AEROBIC ONLY 10CC   Final    Culture  Setup Time 01/25/2013 14:43   Final    Culture     Final    Value:        BLOOD CULTURE RECEIVED  NO GROWTH TO DATE CULTURE WILL BE HELD FOR 5 DAYS BEFORE ISSUING A FINAL NEGATIVE REPORT   Report Status PENDING   Incomplete   CULTURE, BLOOD (ROUTINE X 2)     Status: Normal (Preliminary result)   Collection Time   01/25/13  9:10 AM      Component Value Range Status Comment   Specimen Description BLOOD RIGHT ARM   Final    Special Requests BOTTLES DRAWN AEROBIC AND ANAEROBIC 10CC   Final    Culture  Setup Time 01/25/2013 14:43   Final    Culture     Final    Value:        BLOOD CULTURE RECEIVED NO GROWTH TO DATE CULTURE WILL BE HELD FOR 5 DAYS BEFORE ISSUING A FINAL NEGATIVE REPORT   Report Status PENDING   Incomplete      Studies/Results: Ct Abdomen Pelvis Wo Contrast  01/25/2013  *RADIOLOGY REPORT*  Clinical Data: BPH.  Obstructive process below the bladder. Bilateral hydronephrosis on renal ultrasound.  CT ABDOMEN AND PELVIS WITHOUT CONTRAST  Technique:  Multidetector CT imaging of the abdomen and pelvis was performed following the standard protocol without intravenous contrast.  Comparison: Renal ultrasound 01/25/2013.  Findings: There are small bilateral pleural effusions with infiltration or atelectasis in both lung bases.  Interstitial thickening suggesting edema or fibrosis.  Mild cardiac enlargement.  There is marked bilateral renal hydronephrosis and hydroureter.  No renal or ureteral stones are demonstrated.  The prostate gland is enlarged, measuring 4.8 x 4.9 cm.  The bladder wall is diffusely thickened with bladder wall trabeculation and small bladder diverticula.  Changes suggest bladder outlet obstruction.  Large stone in the gallbladder with mild pericholecystic edema. Changes are nonspecific but could be associated with cholecystitis in the appropriate clinical setting.  Clinical correlation is recommended.  There is mild edema or infiltration in the pericolic gutters, greater on the right with hazy edema in the mesentery.  No significant ascites.  The unenhanced appearance of the  liver, spleen, pancreas, adrenal glands, abdominal aorta, and retroperitoneal lymph nodes is unremarkable.  The stomach, small bowel, and colon are not abnormally distended.  No free air in the abdomen.  Vascular calcifications.  Pelvis:  No free or loculated pelvic fluid collections.  No significant pelvic lymphadenopathy.  No inflammatory changes.  No evidence of diverticulitis.  The appendix is not identified. Degenerative changes in the lumbar spine with normal alignment. Spondylolysis at L5 bilaterally.  IMPRESSION: Bilateral hydronephrosis and hydroureter with hypertrophic changes in the bladder and enlarged prostate gland consistent with bladder outlet obstruction.  No stone or mass identified.  Bilateral pleural effusions with basilar atelectasis or infiltration. Cholelithiasis with mild pericholecystic edema is nonspecific but cholecystitis should be excluded clinically.   Original Report Authenticated By: Burman Nieves, M.D.    US Renal  01/25/2013  *RADIOLOGY REPORT*  Clinical Data: Elevated BUN and creatinine.  RENAL/URINARY TRACT ULTRASOUND COMPLETE  Comparison:  None.  Findings:  Right Kidney:  11.3 cm. Moderate to marked hydronephrosis.  1 cm superior cyst.  Left Kidney:  12.2 cm.  Moderate to marked hydronephrosis.  1.2 cm upper pole cyst.  Bladder:  Irregular thickened wall containing debris.  2.1 cm gallstone noted.  Prostate gland measures up to 5 cm.  IMPRESSION: Moderate to marked bilateral hydronephrosis. Cause of hydronephrosis is indeterminate.  Please see above.  Critical Value/emergent results were called by telephone at the time of interpretation on 01/25/2013  at 7:35 p.m. to [facility] the patient's nurse, who verbally acknowledged these results.   Original Report Authenticated By: Lacy Duverney, M.D.     Medications: Scheduled Meds:    . enoxaparin (LOVENOX) injection  30 mg Subcutaneous Q24H  . ferrous sulfate  325 mg Oral BID WC  . folic acid  1 mg Oral Daily  .  imipenem-cilastatin  250 mg Intravenous Q12H  . vitamin B-12  100 mcg Oral Daily   Continuous Infusions:    . sodium chloride 100 mL/hr at 01/27/13 0826   PRN Meds:.acetaminophen, acetaminophen, acetaminophen, alum & mag hydroxide-simeth, HYDROmorphone (DILAUDID) injection, ondansetron (ZOFRAN) IV, ondansetron, opium-belladonna, oxyCODONE, zolpidem    LOS: 2 days   Katarzyna Wolven,CHRISTOPHER  [facility] Pager [phone]. If 8PM-8AM, please contact night-coverage at [email or web address], password [facility] 01/27/2013, 10:29 AM  LOS: 2 days

## 2013-01-28 DIAGNOSIS — D696 Thrombocytopenia, unspecified: Secondary | ICD-10-CM

## 2013-01-28 DIAGNOSIS — N12 Tubulo-interstitial nephritis, not specified as acute or chronic: Secondary | ICD-10-CM

## 2013-01-28 DIAGNOSIS — E872 Acidosis, unspecified: Secondary | ICD-10-CM

## 2013-01-28 LAB — CBC
HCT: 25.1 % — ABNORMAL LOW (ref 39.0–52.0)
Hemoglobin: 8.6 g/dL — ABNORMAL LOW (ref 13.0–17.0)
MCHC: 34.3 g/dL (ref 30.0–36.0)
RBC: 2.85 MIL/uL — ABNORMAL LOW (ref 4.22–5.81)
WBC: 5 10*3/uL (ref 4.0–10.5)

## 2013-01-28 LAB — BASIC METABOLIC PANEL
Chloride: 116 mEq/L — ABNORMAL HIGH (ref 96–112)
GFR calc non Af Amer: 25 mL/min — ABNORMAL LOW (ref >90)
Glucose, Bld: 85 mg/dL (ref 70–99)
Potassium: 4.2 mEq/L (ref 3.5–5.1)
Sodium: 142 mEq/L (ref 135–145)

## 2013-01-28 MED ORDER — DEXTROSE 5 % IV SOLN
1.0000 g | INTRAVENOUS | Status: DC
  Administered 2013-01-28 – 2013-01-29 (×2): 1 g via INTRAVENOUS
  Filled 2013-01-28 (×3): qty 10

## 2013-01-28 MED ORDER — SODIUM BICARBONATE 8.4 % IV SOLN
INTRAVENOUS | Status: DC
  Administered 2013-01-28 – 2013-01-29 (×2): via INTRAVENOUS
  Filled 2013-01-28 (×4): qty 850

## 2013-01-28 NOTE — Progress Notes (Signed)
[facility] PROGRESS NOTE  Martin Tanner ZOX:[id number] DOB: 1928-09-08 DOA: 01/25/2013 PCP: Pcp Not In System  Assessment/Plan: 1. UTI/acute pyelonephritis: Escherichia coli. Narrow to ceftriaxone. 2. Acute renal failure/possible chronic kidney disease: Improving. Continue IV fluids. 3. Metabolic acidosis: Secondary to renal failure most likely. Sodium bicarbonate. Recheck in a.m. 4. Bilateral hydronephrosis: Thought to be secondary to bladder outlet obstruction. Obstruction thought to be secondary to BPH. RTA? 5. Anemia: Stable. Iron deficient. Borderline B12 deficiency. May be nutritional. Iron and B12 supplementation started. 6. Thrombocytopenia: Stable. 7. Urinary retention: Apparently, patient has had features of prostatism in the past, as well as an episode of urinary retention, requiring bladder catheterization about a year ago. Voiding with some hesitancy, since then. This is likely due to due to BPH. 8. Radiographic finding of possible cholecystitis: Asymptomatic. No further evaluation suggested.  Code Status: Full code Family Communication: Discussed with granddaughter bedside Disposition Plan: Home, likely 48 hours.  Brendia Sacks, MD  [facility] Team 5 Pager [phone] If 7PM-7AM, please contact night-coverage at [email or web address], password [facility] 01/28/2013, 3:12 PM  LOS: 3 days   Brief narrative: 79 y.o. male who was brought to the ED by his family due to fevers and chills and low back pain x 2 days. He has had increased weakness and lethargy with decreased intake of foods and liquids. He also had nausea and vomiting X 1 this am. The history was obtained from the family who are at the bedside because the patient does not speak any english, he lived in [place] until 2 months ago. His family reports that he had kidney or bladder problems in the past and had to have a tube placed in his bladder so he could pass his urine, and at times he still has problems passing his urine. In the  ED he was found to have a UTI, and also an elevated BUN and Cr 38/2.2. He had a fever to 102.9 in the ED, and he was placed on IV rocephin and referred for admission.    Consultants:   urology  Procedures:    Antibiotics:  Ceftriaxone 2/2 >> 2/3, 2/5 >>  Primaxin 2/3 >> 2/5  HPI/Subjective: Afebrile, stable vital signs. Overall doing better. Granddaughter bedside translates. Eating well.  Objective: Filed Vitals:   01/27/13 2138 01/28/13 0159 01/28/13 0530 01/28/13 1342  BP: 135/72 144/75 153/79 139/71  Pulse: 74 86 78 66  Temp: 98.3 F (36.8 C) 100.2 F (37.9 C) 98.7 F (37.1 C) 98.2 F (36.8 C)  TempSrc: Oral Oral Oral Oral  Resp: 20 20 20 18   Height:      Weight:      SpO2: 96% 99% 100% 99%    Intake/Output Summary (Last 24 hours) at 01/28/13 1512 Last data filed at 01/28/13 1508  Gross per 24 hour  Intake   3185 ml  Output   3825 ml  Net   -640 ml   Filed Weights   01/25/13 0248 01/25/13 0606  Weight: 54.432 kg (120 lb) 54 kg (119 lb 0.8 oz)    Exam:  General:  Appears calm and comfortable Eyes: PERRL, normal lids, irises Cardiovascular: RRR, no m/r/g. No LE edema. Respiratory: CTA bilaterally, no w/r/r. Normal respiratory effort. Abdomen: soft, ntnd  Data Reviewed: Basic Metabolic Panel:  Lab 01/28/13 8295 01/27/13 0525 01/26/13 0615 01/25/13 0254  NA 142 140 140 138  K 4.2 4.8 4.7 4.5  CL 116* 115* 117* 113*  CO2 14* 13* 15* 15*  GLUCOSE 85 65* 75 76  BUN 27* 35* 40* 38*  CREATININE 2.27* 2.78* 2.83* 2.21*  CALCIUM 8.0* 7.8* 7.4* 7.9*  MG -- -- -- --  PHOS -- -- -- --   Liver Function Tests:  Lab 01/25/13 0254  AST 20  ALT 10  ALKPHOS 72  BILITOT 0.5  PROT 6.5  ALBUMIN 2.9*   CBC:  Lab 01/28/13 0555 01/27/13 0525 01/26/13 0615 01/25/13 0254  WBC 5.0 6.9 7.5 3.4*  NEUTROABS -- -- -- 3.2  HGB 8.6* 8.1* 8.2* 9.4*  HCT 25.1* 24.4* 24.5* 27.9*  MCV 88.1 90.4 89.7 90.3  PLT 121* 117* 103* 121*    Recent Results (from the  past 240 hour(s))  URINE CULTURE     Status: Normal   Collection Time   01/25/13  3:10 AM      Component Value Range Status Comment   Specimen Description URINE, CLEAN CATCH   Final    Special Requests CX ADDED AT 0337 ON [id number]   Final    Culture  Setup Time 01/25/2013 15:00   Final    Colony Count >=100,000 COLONIES/ML   Final    Culture ESCHERICHIA COLI   Final    Report Status 01/27/2013 FINAL   Final    Organism ID, Bacteria ESCHERICHIA COLI   Final   CULTURE, BLOOD (ROUTINE X 2)     Status: Normal (Preliminary result)   Collection Time   01/25/13  9:00 AM      Component Value Range Status Comment   Specimen Description BLOOD RIGHT ARM   Final    Special Requests BOTTLES DRAWN AEROBIC ONLY 10CC   Final    Culture  Setup Time 01/25/2013 14:43   Final    Culture     Final    Value:        BLOOD CULTURE RECEIVED NO GROWTH TO DATE CULTURE WILL BE HELD FOR 5 DAYS BEFORE ISSUING A FINAL NEGATIVE REPORT   Report Status PENDING   Incomplete   CULTURE, BLOOD (ROUTINE X 2)     Status: Normal (Preliminary result)   Collection Time   01/25/13  9:10 AM      Component Value Range Status Comment   Specimen Description BLOOD RIGHT ARM   Final    Special Requests BOTTLES DRAWN AEROBIC AND ANAEROBIC 10CC   Final    Culture  Setup Time 01/25/2013 14:43   Final    Culture     Final    Value:        BLOOD CULTURE RECEIVED NO GROWTH TO DATE CULTURE WILL BE HELD FOR 5 DAYS BEFORE ISSUING A FINAL NEGATIVE REPORT   Report Status PENDING   Incomplete      Studies: No results found.  Scheduled Meds:   . enoxaparin (LOVENOX) injection  30 mg Subcutaneous Q24H  . ferrous sulfate  325 mg Oral BID WC  . folic acid  1 mg Oral Daily  . imipenem-cilastatin  250 mg Intravenous Q12H  . vitamin B-12  100 mcg Oral Daily   Continuous Infusions:   . sodium chloride 100 mL/hr at 01/28/13 0543    Principal Problem:  *ARF (acute renal failure) Active Problems:  UTI (lower urinary tract infection)  Urinary  retention  Anemia  Weakness generalized     Brendia Sacks, MD  [facility] Team 5 Pager [phone] If 7PM-7AM, please contact night-coverage at [email or web address], password [facility] 01/28/2013, 3:12 PM  LOS: 3 days   Time spent: 25 minutes

## 2013-01-28 NOTE — Progress Notes (Signed)
Subjective: Patient seems more comfortable today than from my initial consultation.  Objective: Vital signs in last 24 hours: Temp:  [98.2 F (36.8 C)-100.2 F (37.9 C)] 98.2 F (36.8 C) (02/05 1342) Pulse Rate:  [66-86] 66  (02/05 1342) Resp:  [18-20] 18  (02/05 1342) BP: (135-153)/(71-79) 139/71 mmHg (02/05 1342) SpO2:  [96 %-100 %] 99 % (02/05 1342)  Intake/Output from previous day: 02/04 0701 - 02/05 0700 In: 3473.3 [P.O.:720; I.V.:2553.3; IV Piggyback:200] Out: 4100 [Urine:4100] Intake/Output this shift: Total I/O In: 1300 [P.O.:480; I.V.:820] Out: 625 [Urine:625]  Physical Exam:  Constitutional: Vital signs reviewed. WD WN in NAD   Eyes: PERRL, No scleral icterus.    Urine is clearing.  Lab Results:  Basename 01/28/13 0555 01/27/13 0525 01/26/13 0615  HGB 8.6* 8.1* 8.2*  HCT 25.1* 24.4* 24.5*   BMET  Basename 01/28/13 0555 01/27/13 0525  NA 142 140  K 4.2 4.8  CL 116* 115*  CO2 14* 13*  GLUCOSE 85 65*  BUN 27* 35*  CREATININE 2.27* 2.78*  CALCIUM 8.0* 7.8*   No results found for this basename: LABPT:3,INR:3 in the last 72 hours No results found for this basename: LABURIN:1 in the last 72 hours Results for orders placed during the hospital encounter of 01/25/13  URINE CULTURE     Status: Normal   Collection Time   01/25/13  3:10 AM      Component Value Range Status Comment   Specimen Description URINE, CLEAN CATCH   Final    Special Requests CX ADDED AT 0337 ON [id number]   Final    Culture  Setup Time 01/25/2013 15:00   Final    Colony Count >=100,000 COLONIES/ML   Final    Culture ESCHERICHIA COLI   Final    Report Status 01/27/2013 FINAL   Final    Organism ID, Bacteria ESCHERICHIA COLI   Final   CULTURE, BLOOD (ROUTINE X 2)     Status: Normal (Preliminary result)   Collection Time   01/25/13  9:00 AM      Component Value Range Status Comment   Specimen Description BLOOD RIGHT ARM   Final    Special Requests BOTTLES DRAWN AEROBIC ONLY 10CC   Final     Culture  Setup Time 01/25/2013 14:43   Final    Culture     Final    Value:        BLOOD CULTURE RECEIVED NO GROWTH TO DATE CULTURE WILL BE HELD FOR 5 DAYS BEFORE ISSUING A FINAL NEGATIVE REPORT   Report Status PENDING   Incomplete   CULTURE, BLOOD (ROUTINE X 2)     Status: Normal (Preliminary result)   Collection Time   01/25/13  9:10 AM      Component Value Range Status Comment   Specimen Description BLOOD RIGHT ARM   Final    Special Requests BOTTLES DRAWN AEROBIC AND ANAEROBIC 10CC   Final    Culture  Setup Time 01/25/2013 14:43   Final    Culture     Final    Value:        BLOOD CULTURE RECEIVED NO GROWTH TO DATE CULTURE WILL BE HELD FOR 5 DAYS BEFORE ISSUING A FINAL NEGATIVE REPORT   Report Status PENDING   Incomplete     Studies/Results: No results found.  Assessment/Plan:   1. Bilateral hydronephrosis with renal failure, status post Foley catheter placement. He has had a postobstructive diuresis and improvement of his creatinine    2. Bladder  outlet obstruction secondary to BPH. He has changes to his bladder noted on scan consistent with obstructive uropathy    3. Urinary tract infection, Escherichia coli, being treated    Plan    1. The patient needs to continue his Foley catheter until he has eventual TURP.    2. Continue antibiotics as necessary    3. Regarding his postobstructive diuresis, if he is taking by mouth liquids adequately, I do not think he needs to have a high maintenance IV rate    4. I wrote out for the patient, and spoke with his interpreter, the future plan. He needs to go home with the catheter. The nurses need to teach him how to manage this catheter. He needs followup in my office with an interpreter, and we will eventually discuss eventual TURP.   LOS: 3 days   Marcine Matar M 01/28/2013, 5:55 PM

## 2013-01-29 LAB — BASIC METABOLIC PANEL
BUN: 18 mg/dL (ref 6–23)
CO2: 24 mEq/L (ref 19–32)
Chloride: 108 mEq/L (ref 96–112)
GFR calc Af Amer: 38 mL/min — ABNORMAL LOW (ref >90)
Potassium: 3.4 mEq/L — ABNORMAL LOW (ref 3.5–5.1)

## 2013-01-29 MED ORDER — SODIUM CHLORIDE 0.45 % IV SOLN
INTRAVENOUS | Status: DC
  Administered 2013-01-29 – 2013-01-30 (×3): via INTRAVENOUS

## 2013-01-29 NOTE — Progress Notes (Signed)
Instructed family about foley catheter care, niece verbalizes understanding.will follow up prior to discharge.

## 2013-01-29 NOTE — Progress Notes (Signed)
[facility] PROGRESS NOTE  Martin Tanner EAV:[id number] DOB: 02/22/1928 DOA: 01/25/2013 PCP: Pcp Not In System  Assessment/Plan: 1. UTI/acute pyelonephritis: Escherichia coli. Continue ceftriaxone. Change to oral antibiotics 2/7 2. Acute renal failure/possible chronic kidney disease: Continues to improve. Continue IV fluids. 3. Metabolic acidosis: Resolved. Secondary to renal failureRecheck in a.m. 4. Bilateral hydronephrosis secondary to obstructive uropathy secondary to BPH: Continue Foley catheter until followup as an outpatient, eventual TURP planned. 5. Anemia: Stable. Iron deficient. Borderline B12 deficiency. May be nutritional. Iron and B12 supplementation started. Followup as outpatient. 6. Thrombocytopenia: Stable. Followup as outpatient. 7. Urinary retention: Apparently, patient has had features of prostatism in the past, as well as an episode of urinary retention, requiring bladder catheterization about a year ago. Voiding with some hesitancy, since then. This is likely due to due to BPH. 8. Radiographic finding of possible cholecystitis: Asymptomatic. No further evaluation suggested.  Code Status: Full code Family Communication: Discussed with granddaughter bedside Disposition Plan: Home 2/7.  Brendia Sacks, MD  [facility] Team 5 Pager [phone] If 7PM-7AM, please contact night-coverage at [email or web address], password [email or web address] 01/29/2013, 9:07 AM  LOS: 4 days   Brief narrative: 79 y.o. male who was brought to the ED by his family due to fevers and chills and low back pain x 2 days. He has had increased weakness and lethargy with decreased intake of foods and liquids. He also had nausea and vomiting X 1 this am. The history was obtained from the family who are at the bedside because the patient does not speak any english, he lived in [place] until 2 months ago. His family reports that he had kidney or bladder problems in the past and had to have a tube placed in his bladder so he could  pass his urine, and at times he still has problems passing his urine. In the ED he was found to have a UTI, and also an elevated BUN and Cr 38/2.2. He had a fever to 102.9 in the ED, and he was placed on IV rocephin and referred for admission.   Consultants:   urology  Procedures:  None  Antibiotics:  Ceftriaxone 2/2 >> 2/3, 2/5 >>  Primaxin 2/3 >> 2/5  HPI/Subjective: Afebrile, stable vital signs. Eating well. Some discomfort with the catheter. Granddaughter present and interprets.  Objective: Filed Vitals:   01/28/13 0530 01/28/13 1342 01/28/13 2150 01/29/13 0547  BP: 153/79 139/71 146/78 156/78  Pulse: 78 66 67 66  Temp: 98.7 F (37.1 C) 98.2 F (36.8 C) 97.4 F (36.3 C) 99 F (37.2 C)  TempSrc: Oral Oral Oral Oral  Resp: 20 18 18 18   Height:      Weight:      SpO2: 100% 99% 99% 99%    Intake/Output Summary (Last 24 hours) at 01/29/13 0907 Last data filed at 01/29/13 0600  Gross per 24 hour  Intake 3239.17 ml  Output   3825 ml  Net -585.83 ml   Filed Weights   01/25/13 0248 01/25/13 0606  Weight: 54.432 kg (120 lb) 54 kg (119 lb 0.8 oz)    Exam:  General:  Appears calm and comfortable Cardiovascular: RRR, no m/r/g.  Respiratory: CTA bilaterally, no w/r/r. Normal respiratory effort.  Data Reviewed: Basic Metabolic Panel:  Lab 01/29/13 1308 01/28/13 0555 01/27/13 0525 01/26/13 0615 01/25/13 0254  NA 141 142 140 140 138  K 3.4* 4.2 4.8 4.7 4.5  CL 108 116* 115* 117* 113*  CO2 24 14* 13* 15* 15*  GLUCOSE 88 85  65* 75 76  BUN 18 27* 35* 40* 38*  CREATININE 1.81* 2.27* 2.78* 2.83* 2.21*  CALCIUM 7.9* 8.0* 7.8* 7.4* 7.9*  MG -- -- -- -- --  PHOS -- -- -- -- --   Liver Function Tests:  Lab 01/25/13 0254  AST 20  ALT 10  ALKPHOS 72  BILITOT 0.5  PROT 6.5  ALBUMIN 2.9*   CBC:  Lab 01/28/13 0555 01/27/13 0525 01/26/13 0615 01/25/13 0254  WBC 5.0 6.9 7.5 3.4*  NEUTROABS -- -- -- 3.2  HGB 8.6* 8.1* 8.2* 9.4*  HCT 25.1* 24.4* 24.5* 27.9*  MCV  88.1 90.4 89.7 90.3  PLT 121* 117* 103* 121*    Recent Results (from the past 240 hour(s))  URINE CULTURE     Status: Normal   Collection Time   01/25/13  3:10 AM      Component Value Range Status Comment   Specimen Description URINE, CLEAN CATCH   Final    Special Requests CX ADDED AT 0337 ON [id number]   Final    Culture  Setup Time 01/25/2013 15:00   Final    Colony Count >=100,000 COLONIES/ML   Final    Culture ESCHERICHIA COLI   Final    Report Status 01/27/2013 FINAL   Final    Organism ID, Bacteria ESCHERICHIA COLI   Final   CULTURE, BLOOD (ROUTINE X 2)     Status: Normal (Preliminary result)   Collection Time   01/25/13  9:00 AM      Component Value Range Status Comment   Specimen Description BLOOD RIGHT ARM   Final    Special Requests BOTTLES DRAWN AEROBIC ONLY 10CC   Final    Culture  Setup Time 01/25/2013 14:43   Final    Culture     Final    Value:        BLOOD CULTURE RECEIVED NO GROWTH TO DATE CULTURE WILL BE HELD FOR 5 DAYS BEFORE ISSUING A FINAL NEGATIVE REPORT   Report Status PENDING   Incomplete   CULTURE, BLOOD (ROUTINE X 2)     Status: Normal (Preliminary result)   Collection Time   01/25/13  9:10 AM      Component Value Range Status Comment   Specimen Description BLOOD RIGHT ARM   Final    Special Requests BOTTLES DRAWN AEROBIC AND ANAEROBIC 10CC   Final    Culture  Setup Time 01/25/2013 14:43   Final    Culture     Final    Value:        BLOOD CULTURE RECEIVED NO GROWTH TO DATE CULTURE WILL BE HELD FOR 5 DAYS BEFORE ISSUING A FINAL NEGATIVE REPORT   Report Status PENDING   Incomplete      Studies: No results found.  Scheduled Meds:    . cefTRIAXone (ROCEPHIN)  IV  1 g Intravenous Q24H  . enoxaparin (LOVENOX) injection  30 mg Subcutaneous Q24H  . ferrous sulfate  325 mg Oral BID WC  . folic acid  1 mg Oral Daily  . vitamin B-12  100 mcg Oral Daily   Continuous Infusions:    . sodium chloride 100 mL/hr at 01/29/13 0750    Principal Problem:  *ARF  (acute renal failure) Active Problems:  UTI (lower urinary tract infection)  Urinary retention  Anemia  Weakness generalized  Pyelonephritis  Metabolic acidosis  Thrombocytopenia     Brendia Sacks, MD  [facility] Team 5 Pager [phone] If 7PM-7AM, please contact night-coverage at [email or web address],  password [email or web address] 01/29/2013, 9:07 AM  LOS: 4 days   Time spent: 20 minutes

## 2013-01-29 NOTE — Care Management Note (Signed)
  Page 1 of 1   01/29/2013     10:19:03 AM   CARE MANAGEMENT NOTE 01/29/2013  Patient:  Martin Tanner,Martin Tanner   Account Number:  [id number]  Date Initiated:  01/27/2013  Documentation initiated by:  Ronny Flurry  Subjective/Objective Assessment:   Acute pyelonephritis     Action/Plan:   Anticipated DC Date:  01/30/2013   Anticipated DC Plan:  HOME W HOME HEALTH SERVICES         Choice offered to / List presented to:  C-4 Adult Children        HH arranged  HH-1 RN      [facility] agency  [facility]   Status of service:   Medicare Important Message given?   (If response is "NO", the following Medicare IM given date fields will be blank) Date Medicare IM given:   Date Additional Medicare IM given:    Discharge Disposition:    Per UR Regulation:  Reviewed for med. necessity/level of care/duration of stay  If discussed at Long Length of Stay Meetings, dates discussed:    Comments:

## 2013-01-30 LAB — BASIC METABOLIC PANEL
CO2: 27 mEq/L (ref 19–32)
Chloride: 106 mEq/L (ref 96–112)
Creatinine, Ser: 1.58 mg/dL — ABNORMAL HIGH (ref 0.50–1.35)
Glucose, Bld: 89 mg/dL (ref 70–99)
Sodium: 141 mEq/L (ref 135–145)

## 2013-01-30 MED ORDER — FERROUS SULFATE 325 (65 FE) MG PO TABS: 325.0000 mg | ORAL_TABLET | Freq: Two times a day (BID) | ORAL | Status: DC

## 2013-01-30 MED ORDER — CEPHALEXIN 500 MG PO CAPS
500.0000 mg | ORAL_CAPSULE | Freq: Two times a day (BID) | ORAL | Status: DC
  Filled 2013-01-30: qty 1

## 2013-01-30 MED ORDER — CYANOCOBALAMIN 100 MCG PO TABS: 100.0000 ug | ORAL_TABLET | Freq: Every day | ORAL | Status: DC

## 2013-01-30 MED ORDER — CEPHALEXIN 500 MG PO CAPS: 500.0000 mg | ORAL_CAPSULE | Freq: Two times a day (BID) | ORAL | Status: DC

## 2013-01-30 NOTE — Progress Notes (Signed)
[facility] PROGRESS NOTE  Martin Tanner AVW:[id number] DOB: 1928/03/29 DOA: 01/25/2013 PCP: Pcp Not In System Encouraged to establish with primary care physician of choice. Urologist: Marcine Matar, M.D.  Assessment/Plan: 1. UTI/acute pyelonephritis: Escherichia coli. Continue ceftriaxone. Change to Keflex. 2. Acute renal failure/possible chronic kidney disease: Rapidly resolving. Saline lock IV. 3. Metabolic acidosis: Resolved. Secondary to renal failure. 4. Bilateral hydronephrosis secondary to obstructive uropathy secondary to BPH: Continue Foley catheter until followup as an outpatient, eventual TURP planned. 5. Anemia: Stable. Iron deficient. Borderline B12 deficiency. May be nutritional. Iron and B12 supplementation started. Followup as outpatient. 6. Thrombocytopenia: Stable. Followup as outpatient. 7. Urinary retention: Apparently, patient has had features of prostatism in the past, as well as an episode of urinary retention, requiring bladder catheterization about a year ago. Voiding with some hesitancy, since then. This is likely due to due to BPH. 8. Radiographic finding of possible cholecystitis: Asymptomatic. No further evaluation suggested.  Code Status: Full code Family Communication: Discussed with granddaughter bedside Disposition Plan: Home 2/7 with home health RN.  Brendia Sacks, MD  [facility] Team 5 Pager [phone] If 7PM-7AM, please contact night-coverage at [email or web address], password [facility] 01/30/2013, 11:08 AM  LOS: 5 days   Brief narrative: 79 y.o. male who was brought to the ED by his family due to fevers and chills and low back pain x 2 days. He has had increased weakness and lethargy with decreased intake of foods and liquids. He also had nausea and vomiting X 1 this am. The history was obtained from the family who are at the bedside because the patient does not speak any english, he lived in [place] until 2 months ago. His family reports that he had kidney  or bladder problems in the past and had to have a tube placed in his bladder so he could pass his urine, and at times he still has problems passing his urine. In the ED he was found to have a UTI, and also an elevated BUN and Cr 38/2.2. He had a fever to 102.9 in the ED, and he was placed on IV rocephin and referred for admission.   Consultants:   urology  Procedures:  None  Antibiotics:  Keflex 2/7 >> 2/11  Ceftriaxone 2/2 >> 2/3, 2/5 >> 2-6  Primaxin 2/3 >> 2/5  HPI/Subjective: Afebrile. VSS. Feeling well. No complaints.  Objective: Filed Vitals:   01/29/13 1412 01/29/13 2153 01/30/13 0558 01/30/13 0737  BP: 146/78 151/79 156/76 119/65  Pulse: 76 65 58 75  Temp: 98.2 F (36.8 C) 99 F (37.2 C) 99 F (37.2 C) 98.4 F (36.9 C)  TempSrc: Oral Oral Oral Oral  Resp: 18 21 18 18   Height:    4\' 9"  (1.448 m)  Weight:    54 kg (119 lb 0.8 oz)  SpO2: 99% 94% 95% 95%    Intake/Output Summary (Last 24 hours) at 01/30/13 1108 Last data filed at 01/30/13 0559  Gross per 24 hour  Intake 2106.67 ml  Output   3150 ml  Net -1043.33 ml   Filed Weights   01/25/13 0248 01/25/13 0606 01/30/13 0737  Weight: 54.432 kg (120 lb) 54 kg (119 lb 0.8 oz) 54 kg (119 lb 0.8 oz)    Exam:  General:  Appears calm and comfortable Cardiovascular: RRR, no m/r/g. No lower extremity edema. Respiratory: CTA bilaterally, no w/r/r. Normal respiratory effort.  Data Reviewed: Basic Metabolic Panel:  Lab 01/30/13 3086 01/29/13 0500 01/28/13 0555 01/27/13 0525 01/26/13 0615  NA 141 141  142 140 140  K 3.5 3.4* 4.2 4.8 4.7  CL 106 108 116* 115* 117*  CO2 27 24 14* 13* 15*  GLUCOSE 89 88 85 65* 75  BUN 14 18 27* 35* 40*  CREATININE 1.58* 1.81* 2.27* 2.78* 2.83*  CALCIUM 7.6* 7.9* 8.0* 7.8* 7.4*  MG -- -- -- -- --  PHOS -- -- -- -- --   Liver Function Tests:  Lab 01/25/13 0254  AST 20  ALT 10  ALKPHOS 72  BILITOT 0.5  PROT 6.5  ALBUMIN 2.9*   CBC:  Lab 01/28/13 0555 01/27/13 0525  01/26/13 0615 01/25/13 0254  WBC 5.0 6.9 7.5 3.4*  NEUTROABS -- -- -- 3.2  HGB 8.6* 8.1* 8.2* 9.4*  HCT 25.1* 24.4* 24.5* 27.9*  MCV 88.1 90.4 89.7 90.3  PLT 121* 117* 103* 121*    Recent Results (from the past 240 hour(s))  URINE CULTURE     Status: Normal   Collection Time   01/25/13  3:10 AM      Component Value Range Status Comment   Specimen Description URINE, CLEAN CATCH   Final    Special Requests CX ADDED AT 0337 ON [id number]   Final    Culture  Setup Time 01/25/2013 15:00   Final    Colony Count >=100,000 COLONIES/ML   Final    Culture ESCHERICHIA COLI   Final    Report Status 01/27/2013 FINAL   Final    Organism ID, Bacteria ESCHERICHIA COLI   Final   CULTURE, BLOOD (ROUTINE X 2)     Status: Normal (Preliminary result)   Collection Time   01/25/13  9:00 AM      Component Value Range Status Comment   Specimen Description BLOOD RIGHT ARM   Final    Special Requests BOTTLES DRAWN AEROBIC ONLY 10CC   Final    Culture  Setup Time 01/25/2013 14:43   Final    Culture     Final    Value:        BLOOD CULTURE RECEIVED NO GROWTH TO DATE CULTURE WILL BE HELD FOR 5 DAYS BEFORE ISSUING A FINAL NEGATIVE REPORT   Report Status PENDING   Incomplete   CULTURE, BLOOD (ROUTINE X 2)     Status: Normal (Preliminary result)   Collection Time   01/25/13  9:10 AM      Component Value Range Status Comment   Specimen Description BLOOD RIGHT ARM   Final    Special Requests BOTTLES DRAWN AEROBIC AND ANAEROBIC 10CC   Final    Culture  Setup Time 01/25/2013 14:43   Final    Culture     Final    Value:        BLOOD CULTURE RECEIVED NO GROWTH TO DATE CULTURE WILL BE HELD FOR 5 DAYS BEFORE ISSUING A FINAL NEGATIVE REPORT   Report Status PENDING   Incomplete      Studies: No results found.  Scheduled Meds:    . cefTRIAXone (ROCEPHIN)  IV  1 g Intravenous Q24H  . enoxaparin (LOVENOX) injection  30 mg Subcutaneous Q24H  . ferrous sulfate  325 mg Oral BID WC  . folic acid  1 mg Oral Daily  . vitamin  B-12  100 mcg Oral Daily   Continuous Infusions:    . sodium chloride 100 mL/hr at 01/30/13 0500    Principal Problem:  *ARF (acute renal failure) Active Problems:  UTI (lower urinary tract infection)  Urinary retention  Anemia  Weakness generalized  Pyelonephritis  Metabolic acidosis  Thrombocytopenia     Brendia Sacks, MD  [facility] Team 5 Pager [phone] If 7PM-7AM, please contact night-coverage at [email or web address], password [email or web address] 01/30/2013, 11:08 AM  LOS: 5 days

## 2013-01-30 NOTE — Progress Notes (Signed)
NURSING PROGRESS NOTE  Martin Tanner [id number] Discharge Data: 01/30/2013 1:25 PM Attending Provider: No att. providers found PCP:Pcp Not In System     [name] to be D/C'd Home per MD order.  Discussed with the patient the After Visit Summary and all questions fully answered. All IV's discontinued with no bleeding noted. All belongings returned to patient for patient to take home.   Last Vital Signs:  Blood pressure 119/65, pulse 75, temperature 98.4 F (36.9 C), temperature source Oral, resp. rate 18, height 4\' 9"  (1.448 m), weight 54 kg (119 lb 0.8 oz), SpO2 95.00%.  Discharge Medication List   Medication List     As of 01/30/2013  1:25 PM    TAKE these medications         acetaminophen 325 MG tablet   Commonly known as: TYLENOL   Take 650 mg by mouth every 6 (six) hours as needed. For pain      cephALEXin 500 MG capsule   Commonly known as: KEFLEX   Take 1 capsule (500 mg total) by mouth every 12 (twelve) hours. Start February 7 in the evening.      cyanocobalamin 100 MCG tablet   Take 1 tablet (100 mcg total) by mouth daily.      ferrous sulfate 325 (65 FE) MG tablet   Take 1 tablet (325 mg total) by mouth 2 (two) times daily with a meal.        Glenn Gullickson, Elmarie Mainland, RN

## 2013-01-30 NOTE — Discharge Summary (Signed)
Physician Discharge Summary  Martin Tanner ZOX:[id number] DOB: Jan 29, 1928 DOA: 01/25/2013  PCP: Pcp Not In System Encouraged to establish with primary care physician of choice. Urologist: Marcine Matar, M.D.  Admit date: 01/25/2013 Discharge date: 01/30/2013  Recommendations for Outpatient Follow-up:  1. Followup resolution of pyelonephritis. 2. Followup resolution of acute renal failure, consider basic metabolic panel in 1-2 weeks. 3. Followup hydronephrosis, obstructive uropathy 4. Followup normocytic anemia, thrombocytopenia consider repeat CBC one to 2 weeks. 5. Home health RN for Foley catheter care.  Follow-up Information    Follow up with DAHLSTEDT, Bertram Millard, MD. In 1 week. (For followup of prostatic enlargement. Make sure that you bring an interpreter the office.)    Contact information:   [address] [place] [address] [phone]       Follow up with Establish with primary care physician of choice. Schedule an appointment as soon as possible for a visit in 2 weeks.        Discharge Diagnoses:  1. Acute pyelonephritis/UTI 2. Acute renal failure, possible chronic kidney disease 3. Metabolic acidosis 4. Bilateral hydronephrosis, obstructive uropathy, urinary retention 5. Anemia 6. Thrombocytopenia  Discharge Condition: Improved Disposition: Home with home health RN  Diet recommendation: Regular  Filed Weights   01/25/13 0248 01/25/13 0606 01/30/13 0737  Weight: 54.432 kg (120 lb) 54 kg (119 lb 0.8 oz) 54 kg (119 lb 0.8 oz)    History of present illness:  79 y.o. male who was brought to the ED by his family due to fevers and chills and low back pain x 2 days. He has had increased weakness and lethargy with decreased intake of foods and liquids. He also had nausea and vomiting X 1 this am. The history was obtained from the family who are at the bedside because the patient does not speak any english, he lived in [place] until 2 months ago. His family  reports that he had kidney or bladder problems in the past and had to have a tube placed in his bladder so he could pass his urine, and at times he still has problems passing his urine. In the ED he was found to have a UTI, and also an elevated BUN and Cr 38/2.2. He had a fever to 102.9 in the ED, and he was placed on IV rocephin and referred for admission.  Hospital Course:  Mr. Fillingim was admitted for treatment of acute pyelonephritis and acute renal failure. Acute pyelonephritis quickly improved with antibiotic therapy. Further evaluation revealed bilateral hydronephrosis felt secondary to obstructive uropathy secondary to BPH. Foley catheter was placed and urology consultation was obtained. Plan at this point is to leave Foley catheter in place and followup with urology as an outpatient for consideration of TURP. Acute renal failure rapidly improving with relief of obstruction and IV fluids. Laboratory studies revealed normocytic anemia and mild thrombocytopenia but asymptomatic. Patient has iron deficiency and was started on iron as well as vitamin B12 given his borderline low B12 level. Patient has insurance and has been instructed to establish with primary care physician of his choice for followup. Discussed current issues and followup recommendations with patient at bedside with granddaughter who speaks [place] and voiced understanding. She will coordinate establishing with a primary care physician. Furthermore active issues and followup recommendations were discussed with the patient with the aid of hospital provided interpreter via phone. Patient voiced understanding. I discussed with office staff at urology office. They will contact the urologist for followup recommendations and schedule the  appointment and contact the patient directly. 1. UTI/acute pyelonephritis: Escherichia coli. Continue ceftriaxone. Keflex.  2. Acute renal failure/possible chronic kidney disease: Rapidly resolving.  3. Metabolic  acidosis: Resolved. Secondary to renal failure.  4. Bilateral hydronephrosis secondary to obstructive uropathy secondary to BPH: Continue Foley catheter until followup as an outpatient, eventual TURP planned.  5. Anemia: Stable. Iron deficient. Borderline B12 deficiency. May be nutritional. Iron and B12 supplementation started. Followup as outpatient.  6. Thrombocytopenia: Stable. Followup as outpatient.  7. Urinary retention: Apparently, patient has had features of prostatism in the past, as well as an episode of urinary retention, requiring bladder catheterization about a year ago. Voiding with some hesitancy, since then. This is likely due to due to BPH.  8. Radiographic finding of possible cholecystitis: Asymptomatic. No further evaluation suggested.  Consultants:   Urology  Procedures:  None  Antibiotics:  Keflex 2/7 >> 2/11   Ceftriaxone 2/2 >> 2/3, 2/5 >> 2/6   Primaxin 2/3 >> 2/5  Discharge Instructions  Discharge Orders    Future Orders Please Complete By Expires   Diet general      Discharge instructions      Comments:   Be sure to finish antibiotics (Keflex) for bladder infection. Be sure to leave Foley catheter in place. Please establish with primary care physician of your choice in followup in approximately 2 weeks. Keep followup appointment with urologist as directed. Call physician or seek immediate medical attention for increased pain, fever or worsening of condition.   Activity as tolerated - No restrictions          Medication List     As of 01/30/2013 12:20 PM    TAKE these medications         acetaminophen 325 MG tablet   Commonly known as: TYLENOL   Take 650 mg by mouth every 6 (six) hours as needed. For pain      cephALEXin 500 MG capsule   Commonly known as: KEFLEX   Take 1 capsule (500 mg total) by mouth every 12 (twelve) hours. Start February 7 in the evening.      cyanocobalamin 100 MCG tablet   Take 1 tablet (100 mcg total) by mouth daily.       ferrous sulfate 325 (65 FE) MG tablet   Take 1 tablet (325 mg total) by mouth 2 (two) times daily with a meal.         The results of significant diagnostics from this hospitalization (including imaging, microbiology, ancillary and laboratory) are listed below for reference.    Significant Diagnostic Studies: Ct Abdomen Pelvis Wo Contrast  01/25/2013  *RADIOLOGY REPORT*  Clinical Data: BPH.  Obstructive process below the bladder. Bilateral hydronephrosis on renal ultrasound.  CT ABDOMEN AND PELVIS WITHOUT CONTRAST  Technique:  Multidetector CT imaging of the abdomen and pelvis was performed following the standard protocol without intravenous contrast.  Comparison: Renal ultrasound 01/25/2013.  Findings: There are small bilateral pleural effusions with infiltration or atelectasis in both lung bases.  Interstitial thickening suggesting edema or fibrosis.  Mild cardiac enlargement.  There is marked bilateral renal hydronephrosis and hydroureter.  No renal or ureteral stones are demonstrated.  The prostate gland is enlarged, measuring 4.8 x 4.9 cm.  The bladder wall is diffusely thickened with bladder wall trabeculation and small bladder diverticula.  Changes suggest bladder outlet obstruction.  Large stone in the gallbladder with mild pericholecystic edema. Changes are nonspecific but could be associated with cholecystitis in the appropriate clinical setting.  Clinical correlation is recommended.  There is mild edema or infiltration in the pericolic gutters, greater on the right with hazy edema in the mesentery.  No significant ascites.  The unenhanced appearance of the liver, spleen, pancreas, adrenal glands, abdominal aorta, and retroperitoneal lymph nodes is unremarkable.  The stomach, small bowel, and colon are not abnormally distended.  No free air in the abdomen.  Vascular calcifications.  Pelvis:  No free or loculated pelvic fluid collections.  No significant pelvic lymphadenopathy.  No  inflammatory changes.  No evidence of diverticulitis.  The appendix is not identified. Degenerative changes in the lumbar spine with normal alignment. Spondylolysis at L5 bilaterally.  IMPRESSION: Bilateral hydronephrosis and hydroureter with hypertrophic changes in the bladder and enlarged prostate gland consistent with bladder outlet obstruction.  No stone or mass identified.  Bilateral pleural effusions with basilar atelectasis or infiltration. Cholelithiasis with mild pericholecystic edema is nonspecific but cholecystitis should be excluded clinically.   Original Report Authenticated By: Burman Nieves, M.D.    Dg Chest 2 View  01/25/2013  *RADIOLOGY REPORT*  Clinical Data: Fever, emesis, low back pain for 2 days.  CHEST - 2 VIEW  Comparison: None.  Findings: Borderline heart size and pulmonary vascularity.  No focal airspace consolidation in the lungs.  Scattered interstitial changes likely representing fibrosis.  No blunting of costophrenic angles.  No pneumothorax.  Mediastinal contours appear intact. Calcification of the aorta.  Degenerative changes in the thoracic spine.  IMPRESSION: Borderline heart size and pulmonary vascularity.  No focal consolidation.   Original Report Authenticated By: Burman Nieves, M.D.    US Renal  01/25/2013  *RADIOLOGY REPORT*  Clinical Data: Elevated BUN and creatinine.  RENAL/URINARY TRACT ULTRASOUND COMPLETE  Comparison:  None.  Findings:  Right Kidney:  11.3 cm. Moderate to marked hydronephrosis.  1 cm superior cyst.  Left Kidney:  12.2 cm.  Moderate to marked hydronephrosis.  1.2 cm upper pole cyst.  Bladder:  Irregular thickened wall containing debris.  2.1 cm gallstone noted.  Prostate gland measures up to 5 cm.  IMPRESSION: Moderate to marked bilateral hydronephrosis. Cause of hydronephrosis is indeterminate.  Please see above.  Critical Value/emergent results were called by telephone at the time of interpretation on 01/25/2013  at 7:35 p.m. to [facility] the patient's  nurse, who verbally acknowledged these results.   Original Report Authenticated By: Lacy Duverney, M.D.     Microbiology: Recent Results (from the past 240 hour(s))  URINE CULTURE     Status: Normal   Collection Time   01/25/13  3:10 AM      Component Value Range Status Comment   Specimen Description URINE, CLEAN CATCH   Final    Special Requests CX ADDED AT 0337 ON 161096   Final    Culture  Setup Time 01/25/2013 15:00   Final    Colony Count >=100,000 COLONIES/ML   Final    Culture ESCHERICHIA COLI   Final    Report Status 01/27/2013 FINAL   Final    Organism ID, Bacteria ESCHERICHIA COLI   Final   CULTURE, BLOOD (ROUTINE X 2)     Status: Normal (Preliminary result)   Collection Time   01/25/13  9:00 AM      Component Value Range Status Comment   Specimen Description BLOOD RIGHT ARM   Final    Special Requests BOTTLES DRAWN AEROBIC ONLY 10CC   Final    Culture  Setup Time 01/25/2013 14:43   Final    Culture  Final    Value:        BLOOD CULTURE RECEIVED NO GROWTH TO DATE CULTURE WILL BE HELD FOR 5 DAYS BEFORE ISSUING A FINAL NEGATIVE REPORT   Report Status PENDING   Incomplete   CULTURE, BLOOD (ROUTINE X 2)     Status: Normal (Preliminary result)   Collection Time   01/25/13  9:10 AM      Component Value Range Status Comment   Specimen Description BLOOD RIGHT ARM   Final    Special Requests BOTTLES DRAWN AEROBIC AND ANAEROBIC 10CC   Final    Culture  Setup Time 01/25/2013 14:43   Final    Culture     Final    Value:        BLOOD CULTURE RECEIVED NO GROWTH TO DATE CULTURE WILL BE HELD FOR 5 DAYS BEFORE ISSUING A FINAL NEGATIVE REPORT   Report Status PENDING   Incomplete      Labs: Basic Metabolic Panel:  Lab 01/30/13 1610 01/29/13 0500 01/28/13 0555 01/27/13 0525 01/26/13 0615  NA 141 141 142 140 140  K 3.5 3.4* 4.2 4.8 4.7  CL 106 108 116* 115* 117*  CO2 27 24 14* 13* 15*  GLUCOSE 89 88 85 65* 75  BUN 14 18 27* 35* 40*  CREATININE 1.58* 1.81* 2.27* 2.78* 2.83*  CALCIUM  7.6* 7.9* 8.0* 7.8* 7.4*  MG -- -- -- -- --  PHOS -- -- -- -- --   Liver Function Tests:  Lab 01/25/13 0254  AST 20  ALT 10  ALKPHOS 72  BILITOT 0.5  PROT 6.5  ALBUMIN 2.9*   CBC:  Lab 01/28/13 0555 01/27/13 0525 01/26/13 0615 01/25/13 0254  WBC 5.0 6.9 7.5 3.4*  NEUTROABS -- -- -- 3.2  HGB 8.6* 8.1* 8.2* 9.4*  HCT 25.1* 24.4* 24.5* 27.9*  MCV 88.1 90.4 89.7 90.3  PLT 121* 117* 103* 121*    Principal Problem:  *ARF (acute renal failure) Active Problems:  UTI (lower urinary tract infection)  Urinary retention  Anemia  Weakness generalized  Pyelonephritis  Metabolic acidosis  Thrombocytopenia   Time coordinating discharge: 40 minutes  Signed:  Brendia Sacks, MD [facility] 01/30/2013, 12:20 PM

## 2013-01-31 LAB — CULTURE, BLOOD (ROUTINE X 2): Culture: NO GROWTH

## 2013-03-03 ENCOUNTER — Ambulatory Visit
Admission: RE | Admit: 2013-03-03 | Discharge: 2013-03-03 | Disposition: A | Discharge status: Home / Self Care | Payer: No Typology Code available for payment source | Source: Ambulatory Visit | Attending: Infectious Diseases | Admitting: Infectious Diseases

## 2013-03-03 ENCOUNTER — Other Ambulatory Visit: Payer: Self-pay | Admitting: Infectious Diseases

## 2013-03-03 DIAGNOSIS — Z111 Encounter for screening for respiratory tuberculosis: Secondary | ICD-10-CM

## 2013-03-06 ENCOUNTER — Other Ambulatory Visit: Payer: Self-pay | Admitting: Urology

## 2013-03-06 ENCOUNTER — Encounter (HOSPITAL_COMMUNITY): Payer: Self-pay | Admitting: Pharmacy Technician

## 2013-03-08 NOTE — Patient Instructions (Signed)
Costa Sequoyah Counterman  03/08/2013   Your procedure is scheduled on:  03/16/13   Report to [facility] at    0530  AM.  Call this number if you have problems the morning of surgery: [phone]   Remember:   Do not eat food or drink liquids after midnight.   Take these medicines the morning of surgery with A SIP OF WATER:    Do not wear jewelry,   Do not wear lotions, powders, or perfumes. .  . Men may shave face and neck.  Do not bring valuables to the hospital.  Contacts, dentures or bridgework may not be worn into surgery.  Leave suitcase in the car. After surgery it may be brought to your room.  For patients admitted to the hospital, checkout time is 11:00 AM the day of  discharge.     SEE CHG INSTRUCTION SHEET    Please read over the following fact sheets that you were given: MRSA Information, coughing and deep breathing exercises, leg exercises               Failure to comply with these instructions may result in cancellation of your surgery.                Patient Signature ____________________________              Nurse Signature _______________________________

## 2013-03-09 ENCOUNTER — Encounter (HOSPITAL_COMMUNITY)
Admission: RE | Admit: 2013-03-09 | Discharge: 2013-03-09 | Disposition: A | Discharge status: Home / Self Care | Payer: Medicaid Other | Source: Ambulatory Visit | Attending: Urology | Admitting: Urology

## 2013-03-09 ENCOUNTER — Encounter (HOSPITAL_COMMUNITY): Payer: Self-pay

## 2013-03-09 ENCOUNTER — Ambulatory Visit (HOSPITAL_COMMUNITY)
Admission: RE | Admit: 2013-03-09 | Discharge: 2013-03-09 | Disposition: A | Discharge status: Home / Self Care | Payer: Medicaid Other | Source: Ambulatory Visit | Attending: Urology | Admitting: Urology

## 2013-03-09 DIAGNOSIS — I517 Cardiomegaly: Secondary | ICD-10-CM | POA: Insufficient documentation

## 2013-03-09 DIAGNOSIS — Z01812 Encounter for preprocedural laboratory examination: Secondary | ICD-10-CM | POA: Insufficient documentation

## 2013-03-09 DIAGNOSIS — Z01818 Encounter for other preprocedural examination: Secondary | ICD-10-CM | POA: Insufficient documentation

## 2013-03-09 LAB — BASIC METABOLIC PANEL
BUN: 24 mg/dL — ABNORMAL HIGH (ref 6–23)
CO2: 24 mEq/L (ref 19–32)
Chloride: 106 mEq/L (ref 96–112)
GFR calc Af Amer: 64 mL/min — ABNORMAL LOW (ref >90)
Potassium: 4.9 mEq/L (ref 3.5–5.1)

## 2013-03-09 LAB — CBC
HCT: 34 % — ABNORMAL LOW (ref 39.0–52.0)
Platelets: 243 10*3/uL (ref 150–400)
RBC: 3.79 MIL/uL — ABNORMAL LOW (ref 4.22–5.81)
RDW: 13.2 % (ref 11.5–15.5)
WBC: 6.3 10*3/uL (ref 4.0–10.5)

## 2013-03-09 LAB — SURGICAL PCR SCREEN: Staphylococcus aureus: INVALID — AB

## 2013-03-09 NOTE — Progress Notes (Signed)
Grandson called back and informed grandson that nasal swab result had to be sent out and results would not be back until Thursday.  Grandson voiced understanding and is aware that nurse will call on Thursday with nasal swab results.

## 2013-03-09 NOTE — Progress Notes (Signed)
Grandson wife with patient at preop appointment as interpreter.  She speaks [place] and [place].  Patient is from [place].

## 2013-03-09 NOTE — Progress Notes (Signed)
Attempted to call [phone] no answer to let patient be aware PCR swab sent out.

## 2013-03-11 LAB — MRSA CULTURE

## 2013-03-11 NOTE — Progress Notes (Signed)
Spoke with grandsonScarlette Shorts and he gave me number for his wifeMichiel Cowboy [phone] who interprets.  She was informed that nasal swab results from 03/09/13 were negative.  She voiced understanding. Reminded her again patient needed to arrive at 0530 and surgery is at 0730 am on 03/16/13.

## 2013-03-16 ENCOUNTER — Encounter (HOSPITAL_COMMUNITY): Payer: Self-pay | Admitting: Anesthesiology

## 2013-03-16 ENCOUNTER — Observation Stay (HOSPITAL_COMMUNITY)
Admission: RE | Admit: 2013-03-16 | Discharge: 2013-03-17 | Disposition: A | Discharge status: Home / Self Care | Payer: Medicaid Other | Source: Ambulatory Visit | Attending: Urology | Admitting: Urology

## 2013-03-16 ENCOUNTER — Encounter (HOSPITAL_COMMUNITY)
Admission: RE | Disposition: A | Discharge status: Home / Self Care | Payer: Self-pay | Source: Ambulatory Visit | Attending: Urology

## 2013-03-16 ENCOUNTER — Ambulatory Visit (HOSPITAL_COMMUNITY): Payer: Medicaid Other | Admitting: Anesthesiology

## 2013-03-16 ENCOUNTER — Encounter (HOSPITAL_COMMUNITY): Payer: Self-pay | Admitting: *Deleted

## 2013-03-16 DIAGNOSIS — R300 Dysuria: Secondary | ICD-10-CM | POA: Insufficient documentation

## 2013-03-16 DIAGNOSIS — R350 Frequency of micturition: Secondary | ICD-10-CM | POA: Insufficient documentation

## 2013-03-16 DIAGNOSIS — N138 Other obstructive and reflux uropathy: Principal | ICD-10-CM | POA: Insufficient documentation

## 2013-03-16 DIAGNOSIS — I517 Cardiomegaly: Secondary | ICD-10-CM | POA: Insufficient documentation

## 2013-03-16 DIAGNOSIS — N401 Enlarged prostate with lower urinary tract symptoms: Secondary | ICD-10-CM | POA: Insufficient documentation

## 2013-03-16 DIAGNOSIS — N320 Bladder-neck obstruction: Secondary | ICD-10-CM | POA: Insufficient documentation

## 2013-03-16 HISTORY — PX: TRANSURETHRAL RESECTION OF PROSTATE: SHX73

## 2013-03-16 SURGERY — TRANSURETHRAL RESECTION OF THE PROSTATE WITH GYRUS INSTRUMENTS
Anesthesia: General | Wound class: Clean Contaminated

## 2013-03-16 MED ORDER — HYDROCODONE-ACETAMINOPHEN 5-325 MG PO TABS: 1.0000 | ORAL_TABLET | ORAL | Status: DC | PRN

## 2013-03-16 MED ORDER — LIDOCAINE HCL (PF) 2 % IJ SOLN
INTRAMUSCULAR | Status: DC | PRN
  Administered 2013-03-16: 75 mg

## 2013-03-16 MED ORDER — DOCUSATE SODIUM 100 MG PO CAPS
100.0000 mg | ORAL_CAPSULE | Freq: Two times a day (BID) | ORAL | Status: DC
  Administered 2013-03-16 – 2013-03-17 (×3): 100 mg via ORAL
  Filled 2013-03-16 (×4): qty 1

## 2013-03-16 MED ORDER — BELLADONNA ALKALOIDS-OPIUM 16.2-60 MG RE SUPP: 1.0000 | Freq: Three times a day (TID) | RECTAL | Status: DC | PRN

## 2013-03-16 MED ORDER — LACTATED RINGERS IV SOLN: INTRAVENOUS | Status: DC

## 2013-03-16 MED ORDER — CIPROFLOXACIN IN D5W 400 MG/200ML IV SOLN
400.0000 mg | INTRAVENOUS | Status: AC
  Administered 2013-03-16: 400 mg via INTRAVENOUS

## 2013-03-16 MED ORDER — OXYCODONE HCL 5 MG/5ML PO SOLN
5.0000 mg | Freq: Once | ORAL | Status: DC | PRN
  Filled 2013-03-16: qty 5

## 2013-03-16 MED ORDER — CIPROFLOXACIN IN D5W 400 MG/200ML IV SOLN
INTRAVENOUS | Status: AC
  Filled 2013-03-16: qty 200

## 2013-03-16 MED ORDER — ONDANSETRON HCL 4 MG/2ML IJ SOLN
INTRAMUSCULAR | Status: DC | PRN
  Administered 2013-03-16: 4 mg via INTRAVENOUS

## 2013-03-16 MED ORDER — SULFAMETHOXAZOLE-TRIMETHOPRIM 400-80 MG PO TABS: 1.0000 | ORAL_TABLET | Freq: Two times a day (BID) | ORAL | Status: DC

## 2013-03-16 MED ORDER — PROPOFOL 10 MG/ML IV BOLUS
INTRAVENOUS | Status: DC | PRN
  Administered 2013-03-16: 120 mg via INTRAVENOUS
  Administered 2013-03-16: 30 mg via INTRAVENOUS

## 2013-03-16 MED ORDER — HYDROMORPHONE HCL PF 1 MG/ML IJ SOLN
0.2500 mg | INTRAMUSCULAR | Status: DC | PRN
  Administered 2013-03-16 (×4): 0.5 mg via INTRAVENOUS

## 2013-03-16 MED ORDER — LACTATED RINGERS IV SOLN
INTRAVENOUS | Status: DC | PRN
  Administered 2013-03-16: 07:00:00 via INTRAVENOUS

## 2013-03-16 MED ORDER — HYDROMORPHONE HCL PF 1 MG/ML IJ SOLN
INTRAMUSCULAR | Status: AC
  Filled 2013-03-16: qty 1

## 2013-03-16 MED ORDER — OXYCODONE HCL 5 MG PO TABS: 5.0000 mg | ORAL_TABLET | Freq: Once | ORAL | Status: DC | PRN

## 2013-03-16 MED ORDER — SULFAMETHOXAZOLE-TRIMETHOPRIM 400-80 MG PO TABS
1.0000 | ORAL_TABLET | Freq: Two times a day (BID) | ORAL | Status: DC
  Administered 2013-03-16 – 2013-03-17 (×3): 1 via ORAL
  Filled 2013-03-16 (×4): qty 1

## 2013-03-16 MED ORDER — SODIUM CHLORIDE 0.9 % IR SOLN
Status: DC | PRN
  Administered 2013-03-16: 15000 mL

## 2013-03-16 MED ORDER — METOCLOPRAMIDE HCL 5 MG/ML IJ SOLN
INTRAMUSCULAR | Status: AC
  Filled 2013-03-16: qty 2

## 2013-03-16 MED ORDER — FENTANYL CITRATE 0.05 MG/ML IJ SOLN
INTRAMUSCULAR | Status: DC | PRN
  Administered 2013-03-16 (×4): 25 ug via INTRAVENOUS

## 2013-03-16 MED ORDER — ONDANSETRON HCL 4 MG/2ML IJ SOLN
4.0000 mg | Freq: Three times a day (TID) | INTRAMUSCULAR | Status: DC | PRN
Start: 2013-03-16 — End: 2013-03-17

## 2013-03-16 MED ORDER — SODIUM CHLORIDE 0.45 % IV SOLN
INTRAVENOUS | Status: DC
  Administered 2013-03-16 (×2): via INTRAVENOUS

## 2013-03-16 MED ORDER — METOCLOPRAMIDE HCL 5 MG/ML IJ SOLN
10.0000 mg | Freq: Once | INTRAMUSCULAR | Status: AC | PRN
  Administered 2013-03-16: 10 mg via INTRAVENOUS

## 2013-03-16 SURGICAL SUPPLY — 25 items
BAG URINE DRAINAGE (UROLOGICAL SUPPLIES) ×2 IMPLANT
BAG URO CATCHER STRL LF (DRAPE) ×2 IMPLANT
BLADE SURG 15 STRL LF DISP TIS (BLADE) IMPLANT
BLADE SURG 15 STRL SS (BLADE)
CATH FOLEY 3WAY 30CC 22FR (CATHETERS) ×2 IMPLANT
CLOTH BEACON ORANGE TIMEOUT ST (SAFETY) ×2 IMPLANT
DRAPE CAMERA CLOSED 9X96 (DRAPES) ×2 IMPLANT
ELECT BUTTON HF 24-28F 2 30DE (ELECTRODE) IMPLANT
ELECT HF RESECT BIPO 24F 45 ND (CUTTING LOOP) IMPLANT
ELECT LOOP MED HF 24F 12D (CUTTING LOOP) IMPLANT
ELECT LOOP MED HF 24F 12D CBL (CLIP) ×2 IMPLANT
ELECT REM PT RETURN 9FT ADLT (ELECTROSURGICAL)
ELECTRODE REM PT RTRN 9FT ADLT (ELECTROSURGICAL) IMPLANT
EVACUATOR MICROVAS BLADDER (UROLOGICAL SUPPLIES) ×2 IMPLANT
GLOVE BIOGEL M 8.0 STRL (GLOVE) ×2 IMPLANT
GOWN PREVENTION PLUS XLARGE (GOWN DISPOSABLE) ×2 IMPLANT
GOWN STRL REIN XL XLG (GOWN DISPOSABLE) ×2 IMPLANT
HOLDER FOLEY CATH W/STRAP (MISCELLANEOUS) IMPLANT
IV NS IRRIG 3000ML ARTHROMATIC (IV SOLUTION) IMPLANT
KIT ASPIRATION TUBING (SET/KITS/TRAYS/PACK) ×2 IMPLANT
MANIFOLD NEPTUNE II (INSTRUMENTS) ×2 IMPLANT
PACK CYSTO (CUSTOM PROCEDURE TRAY) ×2 IMPLANT
SUT ETHILON 3 0 PS 1 (SUTURE) IMPLANT
SYR 30ML LL (SYRINGE) ×2 IMPLANT
TUBING CONNECTING 10 (TUBING) ×2 IMPLANT

## 2013-03-16 NOTE — Anesthesia Preprocedure Evaluation (Signed)
Anesthesia Evaluation  Patient identified by MRN, date of birth, ID band Patient awake    Reviewed: Allergy & Precautions, H&P , NPO status , Patient's Chart, lab work & pertinent test results, reviewed documented beta blocker date and time   Airway Mallampati: II TM Distance: >3 FB Neck ROM: full    Dental   Pulmonary neg pulmonary ROS,  breath sounds clear to auscultation        Cardiovascular negative cardio ROS  Rhythm:regular     Neuro/Psych negative neurological ROS  negative psych ROS   GI/Hepatic negative GI ROS, Neg liver ROS,   Endo/Other  negative endocrine ROS  Renal/GU Renal disease  negative genitourinary   Musculoskeletal   Abdominal   Peds  Hematology  (+) anemia ,   Anesthesia Other Findings See surgeon's H&P   Reproductive/Obstetrics negative OB ROS                           Anesthesia Physical Anesthesia Plan  ASA: II  Anesthesia Plan: General   Post-op Pain Management:    Induction: Intravenous  Airway Management Planned: LMA  Additional Equipment:   Intra-op Plan:   Post-operative Plan:   Informed Consent: I have reviewed the patients History and Physical, chart, labs and discussed the procedure including the risks, benefits and alternatives for the proposed anesthesia with the patient or authorized representative who has indicated his/her understanding and acceptance.   Dental Advisory Given  Plan Discussed with: CRNA and Surgeon  Anesthesia Plan Comments:         Anesthesia Quick Evaluation

## 2013-03-16 NOTE — Anesthesia Postprocedure Evaluation (Signed)
Anesthesia Post Note  Patient: Martin Tanner  Procedure(s) Performed: Procedure(s) (LRB): TRANSURETHRAL RESECTION OF THE PROSTATE WITH GYRUS INSTRUMENTS (N/A)  Anesthesia type: General  Patient location: PACU  Post pain: Pain level controlled  Post assessment: Patient's Cardiovascular Status Stable  Last Vitals:  Filed Vitals:   03/16/13 0930  BP: 153/78  Pulse: 57  Temp:   Resp: 14    Post vital signs: Reviewed and stable  Level of consciousness: alert  Complications: No apparent anesthesia complications

## 2013-03-16 NOTE — H&P (Signed)
Urology History and Physical Exam  CC: Difficulty urinating  HPI: 79 year old male presents for TUR-P. His history is outlined below:  I originally saw him in the hospital on 01/26/2013. He had recently presented to [facility] with acute renal failure and generalized weakness/anemia. His creatinine was at least 2.2. His urinalysis was infected. Ultrasound revealed bilateral hydronephrosis and a distended bladder with sediment. A Foley catheter was placed. He is significant spasms from the catheter, but eventually calmed down. He had an otherwise uncomplicated hospital course.   He was seen in the office last week, and cystoscopy revealed an obstructive prostate. Through an interpreter, I outlined the need for a TUR-P to treat the BOO. He presents now for that procedure.  PMH: Past Medical History  Diagnosis Date  . BPH (benign prostatic hyperplasia)     PSH: Past Surgical History  Procedure Laterality Date  . None      Allergies: No Known Allergies  Medications: No prescriptions prior to admission     Social History: History   Social History  . Marital Status: Widowed    Spouse Name: N/A    Number of Children: N/A  . Years of Education: N/A   Occupational History  . Not on file.   Social History Main Topics  . Smoking status: Current Every Day Smoker -- 0.25 packs/day for 62 years    Types: Cigarettes  . Smokeless tobacco: Never Used  . Alcohol Use: No  . Drug Use: No  . Sexually Active: No   Other Topics Concern  . Not on file   Social History Narrative  . No narrative on file    Family History: Family History  Problem Relation Age of Onset  . Other      Negative CAD  . Other      Negative DM2  . Other      Negative HTN  . Other      Negative Cancer    Review of Systems: Genitourinary, constitutional, skin, eye, otolaryngeal, hematologic/lymphatic, cardiovascular, pulmonary, endocrine, musculoskeletal, gastrointestinal, neurological and  psychiatric system(s) were reviewed and pertinent findings if present are noted.  Genitourinary: urinary frequency and dysuria.   Physical Exam: @VITALS2 @ Constitutional: Well nourished and well developed . No acute distress.  ENT:. The ears and nose are normal in appearance.  Neck: The appearance of the neck is normal and no neck mass is present.  Pulmonary: No respiratory distress and normal respiratory rhythm and effort.  Cardiovascular: Heart rate and rhythm are normal . No peripheral edema.  Abdomen: The abdomen is flat. The abdomen is soft and nontender. No masses are palpated. No CVA tenderness. No hernias are palpable. No hepatosplenomegaly noted.  Rectal: Rectal exam demonstrates normal sphincter tone and the anus is normal on inspection. Estimated prostate size is 3+. Normal rectal tone, no rectal masses, prostate is smooth, symmetric and non-tender. The perineum is normal on inspection.  Genitourinary: Examination of the penis demonstrates an indwelling catheter, but no discharge, no masses, no lesions and a normal meatus. The penis is uncircumcised. The scrotum is without lesions. The right epididymis is palpably normal and non-tender. The left epididymis is palpably normal and non-tender. The right testis is atrophic, but non-tender and without masses. The left testis is atrophic, but non-tender and without masses.   Studies:  No results found for this basename: HGB, WBC, PLT,  in the last 72 hours  No results found for this basename: NA, K, CL, CO2, BUN, CREATININE, CALCIUM, MAGNESIUM, GFRNONAA,  GFRAA,  in the last 72 hours   No results found for this basename: PT, INR, APTT,  in the last 72 hours   No components found with this basename: ABG,     Assessment:  Bladder outlet obstruction with resultant hydronephrosis  Plan: TUR-P

## 2013-03-16 NOTE — Op Note (Signed)
Preoperative diagnosis: 1. Bladder outlet obstruction secondary to BPH  Postoperative diagnosis:  1. Bladder outlet obstruction secondary to BPH  Procedure:  1. Cystoscopy 2. Transurethral resection of the prostate  Surgeon: Bertram Millard. Alyzza Andringa, M.D.  Anesthesia: General  Complications: None  Drain: Foley catheter--22 french  EBL: Minimal  Specimens: 1. Prostate chips  Disposition of specimens: Pathology  Indication: Martin Tanner is a patient with bladder outlet obstruction secondary to benign prostatic hyperplasia. After reviewing the management options for treatment, he elected to proceed with the above surgical procedure(s). We have discussed the potential benefits and risks of the procedure, side effects of the proposed treatment, the likelihood of the patient achieving the goals of the procedure, and any potential problems that might occur during the procedure or recuperation. Informed consent has been obtained.  Description of procedure:  The patient was identified in the holding area.He received preoperative antibiotics. He was then taken to the operating room. General anesthetic was administered.  The patient was then placed in the dorsal lithotomy position, prepped and draped in the usual sterile fashion. Timeout was then performed.  A resectoscope sheath was placed using the obturator, and the resectoscope, loop and telescope were placed.  The bladder was then systematically examined in its entirety. There was no evidence of  tumors, stones, or other mucosal pathology. Significant obstructive signs were noted--severe trabeculations and  Cellules.  The ureteral orifices were identified and marked so as to be avoided during the procedure.  The prostate adenoma was then resected utilizing loop cautery resection with the bipolar cutting loop.  The prostate adenoma from the bladder neck back to the verumontanum was resected beginning at the six o'clock position and then  extended to include the right and left lobes of the prostate and anterior prostate, respectively.. Care was taken not to resect distal to the verumontanum.  Hemostasis was then achieved with the cautery and the bladder was emptied and reinspected with no significant bleeding noted at the end of the procedure.  Chips were evacuated from the bladder.  A 3 way catheter was then placed into the bladder and placed on continuous bladder irrigation.  The patient appeared to tolerate the procedure well and without complications.  The patient was able to be awakened and transferred to the recovery unit in satisfactory condition.

## 2013-03-16 NOTE — Transfer of Care (Signed)
Immediate Anesthesia Transfer of Care Note  Patient: Martin Tanner  Procedure(s) Performed: Procedure(s): TRANSURETHRAL RESECTION OF THE PROSTATE WITH GYRUS INSTRUMENTS (N/A)  Patient Location: PACU  Anesthesia Type:General  Level of Consciousness: awake, alert  and patient cooperative  Airway & Oxygen Therapy: Patient Spontanous Breathing and Patient connected to face mask oxygen  Post-op Assessment: Report given to PACU RN, Post -op Vital signs reviewed and stable and Patient moving all extremities X 4  Post vital signs: Reviewed and stable  Complications: No apparent anesthesia complications

## 2013-03-17 ENCOUNTER — Encounter (HOSPITAL_COMMUNITY): Payer: Self-pay | Admitting: Urology

## 2013-03-17 NOTE — Discharge Summary (Signed)
Patient ID: Martin Tanner MRN: [id number] DOB/AGE: [id number] 77 y.o.  Admit date: 03/16/2013 Discharge date: 03/17/2013  Primary Care Physician:  Pcp Not In System  Discharge Diagnoses:    BPH with retention  Consults:     Discharge Medications:   Medication List    TAKE these medications       sulfamethoxazole-trimethoprim 400-80 MG per tablet  Commonly known as:  BACTRIM  Take 1 tablet by mouth 2 (two) times daily.         Significant Diagnostic Studies:  Dg Chest 2 View  03/09/2013  *RADIOLOGY REPORT*  Clinical Data: Preop for TURP  CHEST - 2 VIEW  Comparison: Chest x-ray of 01/25/2013 and CT abdomen pelvis of the same date.  Findings: The lungs remain hyperaerated consistent with emphysema. The previous small effusions have resolved.  Cardiomegaly is stable.  No acute bony abnormality is seen with degenerative change in the lower thoracic spine.  IMPRESSION: Stable cardiomegaly.  No active lung disease.  Probable emphysema.   Original Report Authenticated By: Dwyane Dee, M.D.     Brief H and P: For complete details please refer to admission H and P, but in brief the patient was admitted for TURP for treatment of obstructive BPH.  Hospital Course:  The patient was admitted directly from the operating room to postoperative management for TURP. He tolerated procedure well, and voided well on postoperative morning #1.  Day of Discharge BP 112/66  Pulse 73  Temp(Src) 98.9 F (37.2 C) (Oral)  Resp 18  Ht 5' (1.524 m)  Wt 47.083 kg (103 lb 12.8 oz)  BMI 20.27 kg/m2  SpO2 96%  No results found for this or any previous visit (from the past 24 hour(s)).  Physical Exam: General: Alert and awake oriented x3 not in any acute distress. HEENT: anicteric sclera, pupils reactive to light and accommodation CVS: S1-S2 clear no murmur rubs or gallops Chest: clear to auscultation bilaterally, no wheezing rales or rhonchi Abdomen: soft nontender, nondistended, normal bowel  sounds, no organomegaly Extremities: no cyanosis, clubbing or edema noted bilaterally Neuro: Cranial nerves II-XII intact, no focal neurological deficits  Disposition:  Home  Diet:  No restrictions  Activity:  Discussed/given instruction sheet   Disposition and Follow-up:    Office visit in approximately one month  TESTS THAT NEED FOLLOW-UP  Review of pathology  DISCHARGE FOLLOW-UP     Follow-up Information   Follow up with Chelsea Aus, MD. (as scheduled)    Contact information:   [address] [place] [address] [phone]       Time spent on Discharge:  15 minutes  Signed: Chelsea Aus 03/17/2013, 7:44 AM

## 2013-03-31 ENCOUNTER — Encounter (HOSPITAL_COMMUNITY): Payer: Self-pay | Admitting: *Deleted

## 2013-03-31 ENCOUNTER — Emergency Department (HOSPITAL_COMMUNITY): Payer: Medicaid Other

## 2013-03-31 ENCOUNTER — Inpatient Hospital Stay (HOSPITAL_COMMUNITY)
Admission: EM | Admit: 2013-03-31 | Discharge: 2013-04-04 | DRG: 682 | Disposition: A | Discharge status: Home / Self Care | Payer: Medicaid Other | Attending: Internal Medicine | Admitting: Internal Medicine

## 2013-03-31 ENCOUNTER — Inpatient Hospital Stay (HOSPITAL_COMMUNITY): Payer: Medicaid Other

## 2013-03-31 DIAGNOSIS — E871 Hypo-osmolality and hyponatremia: Secondary | ICD-10-CM

## 2013-03-31 DIAGNOSIS — N1330 Unspecified hydronephrosis: Secondary | ICD-10-CM | POA: Diagnosis present

## 2013-03-31 DIAGNOSIS — N289 Disorder of kidney and ureter, unspecified: Secondary | ICD-10-CM

## 2013-03-31 DIAGNOSIS — N39498 Other specified urinary incontinence: Secondary | ICD-10-CM | POA: Diagnosis present

## 2013-03-31 DIAGNOSIS — D649 Anemia, unspecified: Secondary | ICD-10-CM

## 2013-03-31 DIAGNOSIS — R112 Nausea with vomiting, unspecified: Secondary | ICD-10-CM

## 2013-03-31 DIAGNOSIS — K219 Gastro-esophageal reflux disease without esophagitis: Secondary | ICD-10-CM | POA: Diagnosis present

## 2013-03-31 DIAGNOSIS — Z9889 Other specified postprocedural states: Secondary | ICD-10-CM

## 2013-03-31 DIAGNOSIS — Z681 Body mass index (BMI) 19 or less, adult: Secondary | ICD-10-CM

## 2013-03-31 DIAGNOSIS — N189 Chronic kidney disease, unspecified: Secondary | ICD-10-CM | POA: Diagnosis present

## 2013-03-31 DIAGNOSIS — E43 Unspecified severe protein-calorie malnutrition: Secondary | ICD-10-CM | POA: Diagnosis present

## 2013-03-31 DIAGNOSIS — R531 Weakness: Secondary | ICD-10-CM

## 2013-03-31 DIAGNOSIS — N179 Acute kidney failure, unspecified: Principal | ICD-10-CM

## 2013-03-31 DIAGNOSIS — E860 Dehydration: Secondary | ICD-10-CM

## 2013-03-31 DIAGNOSIS — R32 Unspecified urinary incontinence: Secondary | ICD-10-CM

## 2013-03-31 DIAGNOSIS — N390 Urinary tract infection, site not specified: Secondary | ICD-10-CM

## 2013-03-31 DIAGNOSIS — B961 Klebsiella pneumoniae [K. pneumoniae] as the cause of diseases classified elsewhere: Secondary | ICD-10-CM | POA: Diagnosis present

## 2013-03-31 DIAGNOSIS — R5381 Other malaise: Secondary | ICD-10-CM | POA: Diagnosis present

## 2013-03-31 LAB — URINALYSIS, ROUTINE W REFLEX MICROSCOPIC
Bilirubin Urine: NEGATIVE
Nitrite: NEGATIVE
Protein, ur: 30 mg/dL — AB
Urobilinogen, UA: 0.2 mg/dL (ref 0.0–1.0)

## 2013-03-31 LAB — COMPREHENSIVE METABOLIC PANEL
AST: 20 U/L (ref 0–37)
CO2: 19 mEq/L (ref 19–32)
Chloride: 97 mEq/L (ref 96–112)
Creatinine, Ser: 1.9 mg/dL — ABNORMAL HIGH (ref 0.50–1.35)
GFR calc Af Amer: 36 mL/min — ABNORMAL LOW (ref >90)
GFR calc non Af Amer: 31 mL/min — ABNORMAL LOW (ref >90)
Glucose, Bld: 89 mg/dL (ref 70–99)
Total Bilirubin: 0.6 mg/dL (ref 0.3–1.2)

## 2013-03-31 LAB — CBC WITH DIFFERENTIAL/PLATELET
Basophils Absolute: 0.1 10*3/uL (ref 0.0–0.1)
Eosinophils Relative: 0 % (ref 0–5)
HCT: 31.8 % — ABNORMAL LOW (ref 39.0–52.0)
Hemoglobin: 10.7 g/dL — ABNORMAL LOW (ref 13.0–17.0)
Lymphocytes Relative: 12 % (ref 12–46)
Lymphs Abs: 1.3 10*3/uL (ref 0.7–4.0)
MCV: 85.7 fL (ref 78.0–100.0)
Monocytes Absolute: 0.6 10*3/uL (ref 0.1–1.0)
Monocytes Relative: 6 % (ref 3–12)
Neutro Abs: 9.1 10*3/uL — ABNORMAL HIGH (ref 1.7–7.7)
RBC: 3.71 MIL/uL — ABNORMAL LOW (ref 4.22–5.81)
WBC: 11.1 10*3/uL — ABNORMAL HIGH (ref 4.0–10.5)

## 2013-03-31 MED ORDER — SODIUM CHLORIDE 0.9 % IV BOLUS (SEPSIS)
1000.0000 mL | Freq: Once | INTRAVENOUS | Status: AC
  Administered 2013-03-31: 1000 mL via INTRAVENOUS

## 2013-03-31 MED ORDER — SODIUM CHLORIDE 0.9 % IV SOLN: INTRAVENOUS | Status: DC

## 2013-03-31 MED ORDER — ONDANSETRON HCL 4 MG/2ML IJ SOLN: 4.0000 mg | Freq: Three times a day (TID) | INTRAMUSCULAR | Status: DC | PRN

## 2013-03-31 MED ORDER — ENOXAPARIN SODIUM 30 MG/0.3ML ~~LOC~~ SOLN
30.0000 mg | SUBCUTANEOUS | Status: DC
  Administered 2013-03-31 – 2013-04-03 (×4): 30 mg via SUBCUTANEOUS
  Filled 2013-03-31 (×5): qty 0.3

## 2013-03-31 MED ORDER — SODIUM CHLORIDE 0.9 % IV SOLN
INTRAVENOUS | Status: DC
  Administered 2013-03-31: 100 mL/h via INTRAVENOUS
  Administered 2013-04-01: 10:00:00 via INTRAVENOUS
  Administered 2013-04-01 – 2013-04-02 (×2): 100 mL/h via INTRAVENOUS
  Administered 2013-04-03: 18:00:00 via INTRAVENOUS

## 2013-03-31 MED ORDER — ONDANSETRON HCL 4 MG PO TABS: 4.0000 mg | ORAL_TABLET | Freq: Four times a day (QID) | ORAL | Status: DC | PRN

## 2013-03-31 MED ORDER — DEXTROSE 5 % IV SOLN
1.0000 g | INTRAVENOUS | Status: DC
  Administered 2013-04-01 – 2013-04-03 (×3): 1 g via INTRAVENOUS
  Filled 2013-03-31 (×3): qty 10

## 2013-03-31 MED ORDER — ACETAMINOPHEN 325 MG PO TABS
650.0000 mg | ORAL_TABLET | ORAL | Status: DC | PRN
  Administered 2013-03-31 – 2013-04-01 (×2): 650 mg via ORAL
  Filled 2013-03-31 (×2): qty 2

## 2013-03-31 MED ORDER — CEFTRIAXONE SODIUM 1 G IJ SOLR
1.0000 g | Freq: Once | INTRAMUSCULAR | Status: AC
  Administered 2013-03-31: 1 g via INTRAVENOUS
  Filled 2013-03-31: qty 10

## 2013-03-31 MED ORDER — ONDANSETRON HCL 4 MG/2ML IJ SOLN: 4.0000 mg | Freq: Four times a day (QID) | INTRAMUSCULAR | Status: DC | PRN

## 2013-03-31 NOTE — ED Provider Notes (Signed)
History     CSN: [id number]  Arrival date & time 03/31/13  1500   First MD Initiated Contact with Patient 03/31/13 1516      Chief Complaint  Patient presents with  . Failure To Thrive   Level V caveat for language barrier  (Consider location/radiation/quality/duration/timing/severity/associated sxs/prior treatment) HPI  Patient here with his granddaughter who interprets. She states they live together. She states on March 24 he had prostate surgery done. She reports he was doing well until about 2 days ago when he started having uncontrollable urination with frequency. She denies blood in his urine. He has had nausea and vomiting once in the morning for couple days. She states she also has loss of appetite. He also had some fever last night which was not documented. Patient denies having pain but appears to be in pain. She also describes a dry cough without shortness of breath. She thinks he has lost weight recently. She also states she's complaining of weakness. She states he has diarrhea only when he eats rice.  PCP none Urologist Dr. Hillis Range  Past Medical History  Diagnosis Date  . BPH (benign prostatic hyperplasia)     Past Surgical History  Procedure Laterality Date  . None    . Transurethral resection of prostate N/A 03/16/2013    Procedure: TRANSURETHRAL RESECTION OF THE PROSTATE WITH GYRUS INSTRUMENTS;  Surgeon: Marcine Matar, MD;  Location: [facility];  Service: Urology;  Laterality: N/A;    Family History  Problem Relation Age of Onset  . Other      Negative CAD  . Other      Negative DM2  . Other      Negative HTN  . Other      Negative Cancer    History  Substance Use Topics  . Smoking status: Current Every Day Smoker -- 0.25 packs/day for 62 years    Types: Cigarettes  . Smokeless tobacco: Never Used  . Alcohol Use: No   Lives at home Lives with daughter and granddaughter   Review of Systems  All other systems reviewed and are  negative.    Allergies  Review of patient's allergies indicates no known allergies.  Home Medications   Current Outpatient Rx  Name  Route  Sig  Dispense  Refill  . ibuprofen (ADVIL,MOTRIN) 200 MG tablet   Oral   Take 200 mg by mouth every 6 (six) hours as needed for pain or fever.           BP 110/62  Pulse 89  Temp(Src) 98.2 F (36.8 C) (Oral)  Resp 18  SpO2 100%  Vital signs normal   ORthostatic VS show tachycardia on standing with drop of BP from 119 to 107 systolic   Physical Exam  Nursing note and vitals reviewed. Constitutional: He is oriented to person, place, and time.  Non-toxic appearance. He does not appear ill. No distress.  Very thin elderly male with winter coat on and appears to be painful or having chills  HENT:  Head: Normocephalic and atraumatic.  Right Ear: External ear normal.  Left Ear: External ear normal.  Nose: Nose normal. No mucosal edema or rhinorrhea.  Mouth/Throat: Mucous membranes are normal. No dental abscesses or edematous.  Dry mucous membranes  Eyes: Conjunctivae and EOM are normal. Pupils are equal, round, and reactive to light.  Neck: Normal range of motion and full passive range of motion without pain. Neck supple.  Cardiovascular: Normal rate, regular rhythm and normal heart sounds.  Exam reveals no gallop and no friction rub.   No murmur heard. Pulmonary/Chest: Effort normal and breath sounds normal. No respiratory distress. He has no wheezes. He has no rhonchi. He has no rales. He exhibits no tenderness and no crepitus.  Abdominal: Soft. Normal appearance and bowel sounds are normal. He exhibits no distension. There is no tenderness. There is no rebound and no guarding.  Musculoskeletal: Normal range of motion. He exhibits no edema and no tenderness.  Moves all extremities well.   Neurological: He is alert and oriented to person, place, and time. He has normal strength. No cranial nerve deficit.  Skin: Skin is warm, dry and  intact. No rash noted. No erythema. No pallor.  Psychiatric: His speech is normal and behavior is normal. His mood appears not anxious.  Flat affect    ED Course  Procedures (including critical care time)  Medications  cefTRIAXone (ROCEPHIN) 1 g in dextrose 5 % 50 mL IVPB (not administered)  sodium chloride 0.9 % bolus 1,000 mL (1,000 mLs Intravenous New Bag/Given 03/31/13 1700)     Review of records shows patient had a TURP done on the 24th for urinary retention. He was discharged from the hospital on Septra DS.  Pt given IV rocephin for his UTI.   Recheck at 1910, patient has had 1 L of normal saline. He is now asking for something to eat or drink. Pt has no PCP to follow up with.  Discussed his lab results c/w dehydration with new renal insufficiency. Agreeable to being admitted. GD needs to leave soon, asked to wait to talk to hospitalist.   19:50 Dr Eben Burow, hospitalist, admit to med-surg, Team 8   Results for orders placed during the hospital encounter of 03/31/13  CBC WITH DIFFERENTIAL      Result Value Range   WBC 11.1 (*) 4.0 - 10.5 K/uL   RBC 3.71 (*) 4.22 - 5.81 MIL/uL   Hemoglobin 10.7 (*) 13.0 - 17.0 g/dL   HCT 19.1 (*) 47.8 - 29.5 %   MCV 85.7  78.0 - 100.0 fL   MCH 28.8  26.0 - 34.0 pg   MCHC 33.6  30.0 - 36.0 g/dL   RDW 62.1  30.8 - 65.7 %   Platelets 234  150 - 400 K/uL   Neutrophils Relative 82 (*) 43 - 77 %   Neutro Abs 9.1 (*) 1.7 - 7.7 K/uL   Lymphocytes Relative 12  12 - 46 %   Lymphs Abs 1.3  0.7 - 4.0 K/uL   Monocytes Relative 6  3 - 12 %   Monocytes Absolute 0.6  0.1 - 1.0 K/uL   Eosinophils Relative 0  0 - 5 %   Eosinophils Absolute 0.0  0.0 - 0.7 K/uL   Basophils Relative 1  0 - 1 %   Basophils Absolute 0.1  0.0 - 0.1 K/uL  COMPREHENSIVE METABOLIC PANEL      Result Value Range   Sodium 131 (*) 135 - 145 mEq/L   Potassium 5.1  3.5 - 5.1 mEq/L   Chloride 97  96 - 112 mEq/L   CO2 19  19 - 32 mEq/L   Glucose, Bld 89  70 - 99 mg/dL   BUN 40 (*) 6 -  23 mg/dL   Creatinine, Ser 8.46 (*) 0.50 - 1.35 mg/dL   Calcium 9.1  8.4 - 96.2 mg/dL   Total Protein 8.7 (*) 6.0 - 8.3 g/dL   Albumin 3.5  3.5 - 5.2 g/dL  AST 20  0 - 37 U/L   ALT 11  0 - 53 U/L   Alkaline Phosphatase 81  39 - 117 U/L   Total Bilirubin 0.6  0.3 - 1.2 mg/dL   GFR calc non Af Amer 31 (*) >90 mL/min   GFR calc Af Amer 36 (*) >90 mL/min  URINALYSIS, ROUTINE W REFLEX MICROSCOPIC      Result Value Range   Color, Urine YELLOW  YELLOW   APPearance CLOUDY (*) CLEAR   Specific Gravity, Urine 1.015  1.005 - 1.030   pH 5.5  5.0 - 8.0   Glucose, UA NEGATIVE  NEGATIVE mg/dL   Hgb urine dipstick LARGE (*) NEGATIVE   Bilirubin Urine NEGATIVE  NEGATIVE   Ketones, ur NEGATIVE  NEGATIVE mg/dL   Protein, ur 30 (*) NEGATIVE mg/dL   Urobilinogen, UA 0.2  0.0 - 1.0 mg/dL   Nitrite NEGATIVE  NEGATIVE   Leukocytes, UA MODERATE (*) NEGATIVE  URINE MICROSCOPIC-ADD ON      Result Value Range   WBC, UA 21-50  <3 WBC/hpf   RBC / HPF 7-10  <3 RBC/hpf   Bacteria, UA FEW (*) RARE   Laboratory interpretation all normal except leukocytosis, poss uti, new renal insuffic, hyponatremia, anemia    Results for orders placed during the hospital encounter of 03/09/13  SURGICAL PCR SCREEN      Result Value Range   MRSA, PCR INVALID RESULTS, SPECIMEN SENT FOR CULTURE (*) NEGATIVE   Staphylococcus aureus INVALID RESULTS, SPECIMEN SENT FOR CULTURE (*) NEGATIVE  MRSA CULTURE      Result Value Range   Specimen Description NOSE     Special Requests NONE     Culture       Value: NO STAPHYLOCOCCUS AUREUS ISOLATED     Note: NOMRSA   Report Status 03/11/2013 FINAL    CBC      Result Value Range   WBC 6.3  4.0 - 10.5 K/uL   RBC 3.79 (*) 4.22 - 5.81 MIL/uL   Hemoglobin 11.1 (*) 13.0 - 17.0 g/dL   HCT 16.1 (*) 09.6 - 04.5 %   MCV 89.7  78.0 - 100.0 fL   MCH 29.3  26.0 - 34.0 pg   MCHC 32.6  30.0 - 36.0 g/dL   RDW 40.9  81.1 - 91.4 %   Platelets 243  150 - 400 K/uL  BASIC METABOLIC PANEL       Result Value Range   Sodium 139  135 - 145 mEq/L   Potassium 4.9  3.5 - 5.1 mEq/L   Chloride 106  96 - 112 mEq/L   CO2 24  19 - 32 mEq/L   Glucose, Bld 83  70 - 99 mg/dL   BUN 24 (*) 6 - 23 mg/dL   Creatinine, Ser 7.82  0.50 - 1.35 mg/dL   Calcium 8.9  8.4 - 95.6 mg/dL   GFR calc non Af Amer 55 (*) >90 mL/min   GFR calc Af Amer 64 (*) >90 mL/min     Dg Chest 2 View  03/31/2013  *RADIOLOGY REPORT*  Clinical Data: Fever, cough.  CHEST - 2 VIEW  Comparison: March 09, 2013.  Findings: Mild stable cardiomegaly.  No acute pulmonary disease is noted.  No pleural effusion or pneumothorax is noted.  Bony thorax is intact.  IMPRESSION: No acute cardiopulmonary abnormality seen.   Original Report Authenticated By: Lupita Raider.,  M.D.     Dg Chest 1 View  03/03/2013  IMPRESSION: Minimally prominent markings at the right lung base.  Recommend follow-up.  No calcified granulomas or lymph nodes are evident.   Original Report Authenticated By: Dwyane Dee, M.D.    Dg Chest 2 View  03/09/2013  .  IMPRESSION: Stable cardiomegaly.  No active lung disease.  Probable emphysema.   Original Report Authenticated By: Dwyane Dee, M.D.         1. UTI (lower urinary tract infection)   2. Dehydration   3. Hyponatremia   4. Nausea and vomiting   5. Urinary incontinence   6. Weakness generalized   7. Renal insufficiency     Plan admission   Devoria Albe, MD, Armando Gang '  MDM          Ward Givens, MD 03/31/13 [phone]

## 2013-03-31 NOTE — ED Notes (Signed)
Returned from XR 

## 2013-03-31 NOTE — H&P (Signed)
History and Physical  Martin Tanner ZOX:[id number] DOB: 09-30-1928 DOA: 03/31/2013  Referring physician: Devoria Albe MD(ER) PCP: Pcp Not In System   Chief Complaint: Failure to thrive  HPI: Patient is 79 year old man with past medical history most significant for recent admission for urinary retention status post transurethral resection of his prostate 2 weeks ago comes in today with chief complaints of fever, vomiting and urinary incontinence for 2 days. Patient was accompanied with his daughter-in-law at this time who does not speak [place] but I was able to converse with her in Hindi. She reports he was doing well until about 2 days ago when he started having uncontrollable urination with frequency. She denies blood in his urine. Patient also had fevers since last 2 days but they did not measure her temperature at home. Patient has not been able to keep anything down and has vomited at least 3 times which contained watery stomach contents but no blood. She states patient has loss of appetite. Ptient denies having pain but appears to be in some discomfort. She also describes a dry cough without shortness of breath.   Patient was found to be in acute renal failure with creatinine of 1.9(baseline 1.1). Patient was also found to have elevated white count with left shift and urinalysis suggestive of urinary tract infection. Hospitalists team was asked to admit the patient for antibiotics and fluids.   Chart Review:  The patient was recently discharged on March 25 after transurethral resection of prostate done by urology. Patient was admitted on February 3 2 [facility] with acute renal failure and generalized weakness from bilateral hydronephrosis and distended bladder most likely secondary to bladder flow obstruction from prostate enlargement.  Review of Systems:  15 point review of system is negative except what is noted above  Past Medical History  Diagnosis Date  . BPH (benign prostatic  hyperplasia)     Past Surgical History  Procedure Laterality Date  . None    . Transurethral resection of prostate N/A 03/16/2013    Procedure: TRANSURETHRAL RESECTION OF THE PROSTATE WITH GYRUS INSTRUMENTS;  Surgeon: Marcine Matar, MD;  Location: [facility];  Service: Urology;  Laterality: N/A;    Social History:  reports that he has been smoking Cigarettes.  He has a 15.5 pack-year smoking history. He has never used smokeless tobacco. He reports that he does not drink alcohol or use illicit drugs.  No Known Allergies  Family History  Problem Relation Age of Onset  . Other      Negative CAD  . Other      Negative DM2  . Other      Negative HTN  . Other      Negative Cancer     Prior to Admission medications   Medication Sig Start Date End Date Taking? Authorizing Provider  ibuprofen (ADVIL,MOTRIN) 200 MG tablet Take 200 mg by mouth every 6 (six) hours as needed for pain or fever.   Yes Historical Provider, MD   Physical Exam: Filed Vitals:   03/31/13 1621 03/31/13 1622 03/31/13 1624 03/31/13 1626  BP:  119/65 113/75 107/71  Pulse:  96 103 111  Temp: 98.4 F (36.9 C)     TempSrc: Oral     Resp:      SpO2:       Physical Exam: General: Vital signs reviewed and noted. Well-developed, well-nourished, in no acute distress; alert, appropriate and cooperative throughout examination.  Head: Normocephalic, atraumatic.  Eyes: PERRL, EOMI, No signs of  anemia or jaundince.  Nose: Mucous membranes moist, not inflammed, nonerythematous.  Throat: Oropharynx nonerythematous, no exudate appreciated.   Neck: No deformities, masses, or tenderness noted.Supple, No carotid Bruits, no JVD.  Lungs:  Normal respiratory effort. Clear to auscultation BL without crackles or wheezes.  Heart: RRR. S1 and S2 normal without gallop, murmur, or rubs.  Abdomen:  BS normoactive. Soft, Nondistended, non-tender.  No masses or organomegaly.  Extremities: No pretibial edema.  Neurologic: A&O X3, CN II  - XII are grossly intact. Motor strength is 5/5 in the all 4 extremities, Sensations intact to light touch, Cerebellar signs negative.  Skin: No visible rashes, scars.     Wt Readings from Last 3 Encounters:  03/16/13 103 lb 12.8 oz (47.083 kg)  03/16/13 103 lb 12.8 oz (47.083 kg)  03/09/13 98 lb (44.453 kg)    Labs on Admission:  Basic Metabolic Panel:  Recent Labs Lab 03/31/13 1600  NA 131*  K 5.1  CL 97  CO2 19  GLUCOSE 89  BUN 40*  CREATININE 1.90*  CALCIUM 9.1    Liver Function Tests:  Recent Labs Lab 03/31/13 1600  AST 20  ALT 11  ALKPHOS 81  BILITOT 0.6  PROT 8.7*  ALBUMIN 3.5   CBC:  Recent Labs Lab 03/31/13 1600  WBC 11.1*  NEUTROABS 9.1*  HGB 10.7*  HCT 31.8*  MCV 85.7  PLT 234     Radiological Exams on Admission: Dg Chest 2 View  03/31/2013  *RADIOLOGY REPORT*  Clinical Data: Fever, cough.  CHEST - 2 VIEW  Comparison: March 09, 2013.  Findings: Mild stable cardiomegaly.  No acute pulmonary disease is noted.  No pleural effusion or pneumothorax is noted.  Bony thorax is intact.  IMPRESSION: No acute cardiopulmonary abnormality seen.   Original Report Authenticated By: Lupita Raider.,  M.D.     EKG: Not done    Principal Problem:   ARF (acute renal failure) Active Problems:   UTI (lower urinary tract infection)   Anemia   Weakness generalized   Assessment/Plan 1. Patient is an 79 year old man with past medical history most significant for recent admission for transurethral resection of prostate. Patient started having urinary incontinence and found to have urinary tract infection and acute renal failure.   -Admit to MedSurg unit -Ultrasound KUB - Foley catheter after ultrasound -Ceftriaxone for treatment of urinary tract infection -IV fluids at 100 cc an hour -Consult urology in the morning depending on ultrasound results and discuss plan of care -Patient's acute renal failure is most likely secondary to dehydration from urinary  tract infection. I would obtain renal ultrasound to  rule out hydronephrosis or obstruction as a cause of acute renal failure.   Code Status: Full code Family Communication: Family updated at bedside Disposition Plan/Anticipated LOS: Guarded  Time spent: 50 minutes  Lars Mage, MD  [facility] Team 5  If 7PM-7AM, please contact night-coverage at [email or web address], password [facility] 03/31/2013, 7:41 PM

## 2013-03-31 NOTE — ED Notes (Signed)
Pt does not speak english, granddaughter is translating for pt, sts he has not eaten or drank anything in two days, pt is a pt of dr Retta Diones, was d/ced from [facility] first week of feb. For prostatic enlargement, granddaughter sts hes been urinating in bed x2 days, he sts hes not hungry

## 2013-04-01 DIAGNOSIS — R5381 Other malaise: Secondary | ICD-10-CM

## 2013-04-01 DIAGNOSIS — E871 Hypo-osmolality and hyponatremia: Secondary | ICD-10-CM | POA: Diagnosis present

## 2013-04-01 DIAGNOSIS — N179 Acute kidney failure, unspecified: Principal | ICD-10-CM

## 2013-04-01 LAB — BASIC METABOLIC PANEL
Chloride: 103 mEq/L (ref 96–112)
Creatinine, Ser: 1.83 mg/dL — ABNORMAL HIGH (ref 0.50–1.35)
GFR calc Af Amer: 38 mL/min — ABNORMAL LOW (ref >90)
GFR calc non Af Amer: 32 mL/min — ABNORMAL LOW (ref >90)

## 2013-04-01 LAB — MAGNESIUM: Magnesium: 1.9 mg/dL (ref 1.5–2.5)

## 2013-04-01 MED ORDER — ENSURE COMPLETE PO LIQD
237.0000 mL | Freq: Every day | ORAL | Status: DC
  Administered 2013-04-01 – 2013-04-03 (×3): 237 mL via ORAL

## 2013-04-01 MED ORDER — ADULT MULTIVITAMIN W/MINERALS CH
1.0000 | ORAL_TABLET | Freq: Every day | ORAL | Status: DC
  Administered 2013-04-01 – 2013-04-04 (×4): 1 via ORAL
  Filled 2013-04-01 (×4): qty 1

## 2013-04-01 MED ORDER — PANTOPRAZOLE SODIUM 40 MG PO TBEC
40.0000 mg | DELAYED_RELEASE_TABLET | Freq: Every day | ORAL | Status: DC
  Administered 2013-04-01 – 2013-04-04 (×4): 40 mg via ORAL
  Filled 2013-04-01 (×4): qty 1

## 2013-04-01 MED ORDER — SODIUM CHLORIDE 0.9 % IV BOLUS (SEPSIS)
250.0000 mL | Freq: Once | INTRAVENOUS | Status: AC
  Administered 2013-04-01: 250 mL via INTRAVENOUS

## 2013-04-01 NOTE — Progress Notes (Signed)
Subjective: Patient is nonconversant, but he is alert. His family says that he is feeling better and in no pain.   Objective: Vital signs in last 24 hours: Temp:  [98.5 F (36.9 C)-100.2 F (37.9 C)] 99.8 F (37.7 C) (04/09 1428) Pulse Rate:  [74-93] 85 (04/09 1322) Resp:  [14-16] 16 (04/09 1322) BP: (88-114)/(56-66) 104/58 mmHg (04/09 1322) SpO2:  [96 %-100 %] 100 % (04/09 1322) Weight:  [43.6 kg (96 lb 1.9 oz)] 43.6 kg (96 lb 1.9 oz) (04/08 2046)  Intake/Output from previous day: 04/08 0701 - 04/09 0700 In: -  Out: 720 [Urine:720] Intake/Output this shift: Total I/O In: 1826.7 [P.O.:360; I.V.:1466.7] Out: 600 [Urine:600]  Physical Exam:  Constitutional: Vital signs reviewed. WD WN in NAD   Eyes: PERRL, No scleral icterus.    Urine looks clear  Lab Results:  Recent Labs  03/31/13 1600  HGB 10.7*  HCT 31.8*   BMET  Recent Labs  03/31/13 1600 04/01/13 0340  NA 131* 133*  K 5.1 4.3  CL 97 103  CO2 19 19  GLUCOSE 89 89  BUN 40* 40*  CREATININE 1.90* 1.83*  CALCIUM 9.1 8.1*   No results found for this basename: LABPT, INR,  in the last 72 hours No results found for this basename: LABURIN,  in the last 72 hours Results for orders placed during the hospital encounter of 03/31/13  CULTURE, BLOOD (SINGLE)     Status: None   Collection Time    03/31/13  4:00 PM      Result Value Range Status   Specimen Description BLOOD LEFT ARM   Final   Special Requests BOTTLES DRAWN AEROBIC AND ANAEROBIC 2CC   Final   Culture  Setup Time 03/31/2013 23:28   Final   Culture     Final   Value:        BLOOD CULTURE RECEIVED NO GROWTH TO DATE CULTURE WILL BE HELD FOR 5 DAYS BEFORE ISSUING A FINAL NEGATIVE REPORT   Report Status PENDING   Incomplete    Studies/Results: Dg Chest 2 View  03/31/2013  *RADIOLOGY REPORT*  Clinical Data: Fever, cough.  CHEST - 2 VIEW  Comparison: March 09, 2013.  Findings: Mild stable cardiomegaly.  No acute pulmonary disease is noted.  No  pleural effusion or pneumothorax is noted.  Bony thorax is intact.  IMPRESSION: No acute cardiopulmonary abnormality seen.   Original Report Authenticated By: Lupita Raider.,  M.D.    US Renal  03/31/2013  *RADIOLOGY REPORT*  Clinical Data: Acute renal failure.  RENAL/URINARY TRACT ULTRASOUND COMPLETE  Comparison:  CT abdomen and pelvis without contrast 01/25/2013.  Findings:  Right Kidney:  8 mm hypoechoic lesion at the upper pole of the right kidney likely represents a small cyst.  Moderate right-sided hydronephrosis is present.  The maximal length is 9.0 cm.  No additional parenchymal lesions are present. There are no stones.  Left Kidney:  Moderate left-sided hydronephrosis is present.  The parenchymal thinning is evident.  The maximal length is 12.6 cm.  Bladder:  Marked bladder wall thickening is again noted.  The prostate is enlarged.  IMPRESSION:  1.  Moderate bilateral hydronephrosis is similar to the prior exam. 2.  Marked bladder wall thickening compatible with chronic airway obstruction or a neurogenic bladder. 3.  Enlarged prostate gland.   Original Report Authenticated By: Marin Roberts, M.D.     Assessment/Plan:   1. Status post TURP, with postoperative urinary infection-this is currently being treated  2. Moderate bilateral hydronephrosis, most likely long-term/long-standing. Prior to his surgery, with this appearance, he had a normal creatinine    I agree with Foley catheter placement. I think it okay to discontinue this in a couple of days. I will follow him and write that order    Continue to recheck creatinine. More than likely, this will back down towards normal for him. I would follow his creatinine more carefully than his renal ultrasound at this point.   LOS: 1 day   Chelsea Aus 04/01/2013, 5:53 PM

## 2013-04-01 NOTE — Care Management (Signed)
UR Complete  Martin Tanner [phone]

## 2013-04-01 NOTE — Care Management (Signed)
CM spoke with pt's niece Esmond Plants ( contact info [phone]) who speaks english at bedside concerning discharge planning. Pt's niece states patient previously received HH services from [facility] upon discharge from last hospital admission. Pt's daughter also present at bedside during consult, request [facility] resumption of care from [facility] upon discharge. [facility] rep Baxter Hire  notified of referral at [phone]. Pt states herself & brother only two English speaking family members in the home. Pt lives home with daughter, grand-daughter, Consuello Bossier, & son-n-law. CM informed HH agency to contact grand-daughter concerning [facility] service arrangements. Sticky note left for MD to order [facility] upon pt's discharge.   Roxy Manns Sami Froh,RN,BSN [phone]

## 2013-04-01 NOTE — Progress Notes (Signed)
BLOOD PRESSURE THIS AM 88/56, HR 77, CALLED HOUSE COVERAGE WITH RESULTS.

## 2013-04-01 NOTE — Progress Notes (Signed)
INITIAL NUTRITION ASSESSMENT  Pt meets criteria for severe MALNUTRITION in the context of chronic illness as evidenced by <50% estimated energy intake for the past 3 months in addition to pt with visible severe muscle wasting and subcutaneous fat loss in thoracic and lumbar region.   DOCUMENTATION CODES Per approved criteria  -Severe malnutrition in the context of chronic illness   INTERVENTION: - Recommend MD order appropriate medications for heartburn - Multivitamin 1 tablet PO daily - Ensure Complete daily - Recommend MD monitor for refeeding syndrome and monitor/replete pt's potassium, magnesium, and phosphorus - If PO intake fails to improve after heartburn improves/resolve, recommend MD discuss enteral nutrition r/t pt's severe malnutrition - Will continue to monitor   NUTRITION DIAGNOSIS: Inadequate oral intake related to heartburn as evidenced by family statement.   Goal: 1. Resolution of heartburn 2. Pt to consume >90% of meals/supplements  Monitor:  Weights, labs, intake  Reason for Assessment: Nutrition risk   79 y.o. male  Admitting Dx: ARF (acute renal failure)  ASSESSMENT: Per ED notes, pt was not eating/drinking including nausea/vomiting once in the morning for the past 2 days PTA in addition to loss of appetite. Emesis was described to be watery stomach contents. Pt with recent prostate surgery on March 24th per family. Per family, pt with diarrhea only when he eats rice. Pt does not speak English, but only Hindi.   Met with grand-daughter, who was present at bedside. She reports pt has not been eating for 2 days PTA and before then pt was only eating 1 meal/day for the past 3 months. She stated this meal consisted only of bread, milk, and tea. She knows pt has lost weight but is unsure how much. She reports pt c/o heartburn if he eats anything other than bread. Pt told her that he did not have any nausea/vomiting today. Performed nutrition focused physical exam.    Nutrition Focused Physical Exam:  Subcutaneous Fat:  Orbital Region: mild/moderate wasting Upper Arm Region: mild/moderate wasting Thoracic and Lumbar Region: severe wasting  Muscle:  Temple Region: mild/moderate wasting Clavicle Bone Region: WNL Clavicle and Acromion Bone Region: WNL Scapular Bone Region: N/A Dorsal Hand: mild/moderate wasting Patellar Region: WNL Anterior Thigh Region: mild/moderate wasting Posterior Calf Region: mild/moderate wasting  Edema: None observed   Height: Ht Readings from Last 1 Encounters:  03/31/13 5' (1.524 m)    Weight: Wt Readings from Last 1 Encounters:  03/31/13 96 lb 1.9 oz (43.6 kg)    Ideal Body Weight: 106 lb   % Ideal Body Weight: 91  Wt Readings from Last 10 Encounters:  03/31/13 96 lb 1.9 oz (43.6 kg)  03/16/13 103 lb 12.8 oz (47.083 kg)  03/16/13 103 lb 12.8 oz (47.083 kg)  03/09/13 98 lb (44.453 kg)  01/30/13 119 lb 0.8 oz (54 kg)    Usual Body Weight: Family unsure, state he used to be "fat"   BMI:  Body mass index is 18.77 kg/(m^2).  Estimated Nutritional Needs: Kcal: 1300-1500 Protein: 65-80g Fluid: 1.3-1.5L/day  Skin: Intact  Diet Order: General  EDUCATION NEEDS: -No education needs identified at this time   Intake/Output Summary (Last 24 hours) at 04/01/13 0906 Last data filed at 04/01/13 0648  Gross per 24 hour  Intake      0 ml  Output    720 ml  Net   -720 ml    Last BM: 4/7  Labs:   Recent Labs Lab 03/31/13 1600 04/01/13 0340  NA 131* 133*  K 5.1  4.3  CL 97 103  CO2 19 19  BUN 40* 40*  CREATININE 1.90* 1.83*  CALCIUM 9.1 8.1*  GLUCOSE 89 89    CBG (last 3)  No results found for this basename: GLUCAP,  in the last 72 hours  Scheduled Meds: . cefTRIAXone (ROCEPHIN)  IV  1 g Intravenous Q24H  . enoxaparin (LOVENOX) injection  30 mg Subcutaneous Q24H    Continuous Infusions: . sodium chloride 100 mL/hr (03/31/13 2225)    Past Medical History  Diagnosis Date  .  BPH (benign prostatic hyperplasia)     Past Surgical History  Procedure Laterality Date  . None    . Transurethral resection of prostate N/A 03/16/2013    Procedure: TRANSURETHRAL RESECTION OF THE PROSTATE WITH GYRUS INSTRUMENTS;  Surgeon: Marcine Matar, MD;  Location: [facility];  Service: Urology;  Laterality: N/A;     Levon Hedger MS, RD, LDN [phone] Pager [phone] After Hours Pager

## 2013-04-01 NOTE — Progress Notes (Addendum)
[facility] PROGRESS NOTE  Martin Tanner ZOX:[id number] DOB: 10/04/1928 DOA: 03/31/2013  PCP: No PCP  Brief HPI: Patient is 79 year old man with past medical history most significant for recent admission for urinary retention status post transurethral resection of his prostate 2 weeks ago came in with chief complaints of fever, vomiting and urinary incontinence for 2 days. Patient's daughter-in-law reported that he was doing well until about 2 days prior to admission when he started having uncontrollable urination with frequency. She denied blood in his urine. Patient also had fevers since last 2 days but they did not measure temperature at home. Patient had not been able to keep anything down and vomited at least 3 times which contained watery stomach contents but no blood. She stated patient had loss of appetite. Patient denied having pain but appeared to be in some discomfort. She also described a dry cough without shortness of breath. Patient was found to be in acute renal failure with creatinine of 1.9 (baseline 1.1). Patient was also found to have elevated white count with left shift and urinalysis suggestive of urinary tract infection. Hospitalists team was asked to admit the patient for antibiotics and fluids.   Past medical history:  Past Medical History  Diagnosis Date  . BPH (benign prostatic hyperplasia)     Consultants: Discussed with Dr. Retta Diones with Urology  Procedures: Foley was placed  Antibiotics: IV Ceftriaxone  Subjective: Patient feels better per his daughter who interpreted. No nausea or vomiting. Tolerated breakfast. No pain.  Objective: Vital Signs  Filed Vitals:   03/31/13 1949 03/31/13 2046 04/01/13 0205 04/01/13 0508  BP: 114/57 103/66  88/56  Pulse: 93 92  77  Temp: 98.8 F (37.1 C) 99.8 F (37.7 C) 98.5 F (36.9 C) 98.7 F (37.1 C)  TempSrc: Oral Oral Oral Oral  Resp: 16 14  16   Height:  5' (1.524 m)    Weight:  43.6 kg (96 lb 1.9 oz)     SpO2: 96% 96%  100%    Intake/Output Summary (Last 24 hours) at 04/01/13 1102 Last data filed at 04/01/13 8119  Gross per 24 hour  Intake      0 ml  Output    720 ml  Net   -720 ml   Filed Weights   03/31/13 2046  Weight: 43.6 kg (96 lb 1.9 oz)    General appearance: alert, cooperative, appears stated age and no distress Head: Normocephalic, without obvious abnormality, atraumatic Resp: clear to auscultation bilaterally Cardio: regular rate and rhythm, S1, S2 normal, no murmur, click, rub or gallop GI: soft, non-tender; bowel sounds normal; no masses,  no organomegaly Extremities: extremities normal, atraumatic, no cyanosis or edema Pulses: 2+ and symmetric Skin: Skin color, texture, turgor normal. No rashes or lesions Lymph nodes: Cervical, supraclavicular, and axillary nodes normal. Neurologic: Alert. No focal deficits.  Lab Results:  Basic Metabolic Panel:  Recent Labs Lab 03/31/13 1600 04/01/13 0340  NA 131* 133*  K 5.1 4.3  CL 97 103  CO2 19 19  GLUCOSE 89 89  BUN 40* 40*  CREATININE 1.90* 1.83*  CALCIUM 9.1 8.1*   Liver Function Tests:  Recent Labs Lab 03/31/13 1600  AST 20  ALT 11  ALKPHOS 81  BILITOT 0.6  PROT 8.7*  ALBUMIN 3.5   CBC:  Recent Labs Lab 03/31/13 1600  WBC 11.1*  NEUTROABS 9.1*  HGB 10.7*  HCT 31.8*  MCV 85.7  PLT 234    Recent Results (from the past 240 hour(s))  CULTURE, BLOOD (SINGLE)     Status: None   Collection Time    03/31/13  4:00 PM      Result Value Range Status   Specimen Description BLOOD LEFT ARM   Final   Special Requests BOTTLES DRAWN AEROBIC AND ANAEROBIC 2CC   Final   Culture  Setup Time 03/31/2013 23:28   Final   Culture     Final   Value:        BLOOD CULTURE RECEIVED NO GROWTH TO DATE CULTURE WILL BE HELD FOR 5 DAYS BEFORE ISSUING A FINAL NEGATIVE REPORT   Report Status PENDING   Incomplete      Studies/Results: Dg Chest 2 View  03/31/2013  *RADIOLOGY REPORT*  Clinical Data: Fever, cough.   CHEST - 2 VIEW  Comparison: March 09, 2013.  Findings: Mild stable cardiomegaly.  No acute pulmonary disease is noted.  No pleural effusion or pneumothorax is noted.  Bony thorax is intact.  IMPRESSION: No acute cardiopulmonary abnormality seen.   Original Report Authenticated By: Lupita Raider.,  M.D.    US Renal  03/31/2013  *RADIOLOGY REPORT*  Clinical Data: Acute renal failure.  RENAL/URINARY TRACT ULTRASOUND COMPLETE  Comparison:  CT abdomen and pelvis without contrast 01/25/2013.  Findings:  Right Kidney:  8 mm hypoechoic lesion at the upper pole of the right kidney likely represents a small cyst.  Moderate right-sided hydronephrosis is present.  The maximal length is 9.0 cm.  No additional parenchymal lesions are present. There are no stones.  Left Kidney:  Moderate left-sided hydronephrosis is present.  The parenchymal thinning is evident.  The maximal length is 12.6 cm.  Bladder:  Marked bladder wall thickening is again noted.  The prostate is enlarged.  IMPRESSION:  1.  Moderate bilateral hydronephrosis is similar to the prior exam. 2.  Marked bladder wall thickening compatible with chronic airway obstruction or a neurogenic bladder. 3.  Enlarged prostate gland.   Original Report Authenticated By: Marin Roberts, M.D.     Medications:  Scheduled: . cefTRIAXone (ROCEPHIN)  IV  1 g Intravenous Q24H  . enoxaparin (LOVENOX) injection  30 mg Subcutaneous Q24H  . pantoprazole  40 mg Oral Daily   Continuous: . sodium chloride 100 mL/hr at 04/01/13 1014   WGN:[id number], ondansetron (ZOFRAN) IV, ondansetron  Assessment/Plan:  Principal Problem:   ARF (acute renal failure) Active Problems:   UTI (lower urinary tract infection)   Anemia   Weakness generalized    Acute renal failure due to Prerenal Azotemia and Hyponatremia Improving with IVF. Continue for now. Monitor UO.  UTI Continue Ceftriaxone. Await Urine culture  History of BPH with BOO status post TURP recently US  shows moderate hydronephrosis which is stable from before. Discussed with Dr. Lynnae Sandhoff. No new recommendations. Findings not surprising. He will see later today.  Generalized Weakness Probably due to above. Daughter reports poor oral intake at home. She reports that he gets acid reflux. Will initiate PPI. PT/OT eval.  Code Status Full Code  DVT Prophylaxis Enoxaparin  Family Communication: Discussed with daughter  Disposition Plan: Await improvement. Anticipate discharge in 1-2 days    LOS: 1 day   [facility]  [facility] Pager [phone] 04/01/2013, 11:02 AM  If 8PM-8AM, please contact night-coverage at [email or web address], password [facility] [address]

## 2013-04-02 DIAGNOSIS — D649 Anemia, unspecified: Secondary | ICD-10-CM

## 2013-04-02 LAB — CBC
MCH: 28.9 pg (ref 26.0–34.0)
MCHC: 33.6 g/dL (ref 30.0–36.0)
MCV: 85.9 fL (ref 78.0–100.0)
Platelets: 149 10*3/uL — ABNORMAL LOW (ref 150–400)
RBC: 2.7 MIL/uL — ABNORMAL LOW (ref 4.22–5.81)
RDW: 13.6 % (ref 11.5–15.5)

## 2013-04-02 LAB — BASIC METABOLIC PANEL
Calcium: 7.8 mg/dL — ABNORMAL LOW (ref 8.4–10.5)
Creatinine, Ser: 1.48 mg/dL — ABNORMAL HIGH (ref 0.50–1.35)
GFR calc Af Amer: 49 mL/min — ABNORMAL LOW (ref >90)
GFR calc non Af Amer: 42 mL/min — ABNORMAL LOW (ref >90)
Sodium: 135 mEq/L (ref 135–145)

## 2013-04-02 LAB — ABO/RH: ABO/RH(D): O POS

## 2013-04-02 NOTE — Clinical Documentation Improvement (Signed)
MALNUTRITION DOCUMENTATION CLARIFICATION  THIS DOCUMENT IS NOT A PERMANENT PART OF THE MEDICAL RECORD  TO RESPOND TO THE THIS QUERY, FOLLOW THE INSTRUCTIONS BELOW:  1. If needed, update documentation for the patient's encounter via the notes activity.  2. Access this query again and click edit on the In [employer].  3. After updating, or not, click F2 to complete all highlighted (required) fields concerning your review. Select "additional documentation in the medical record" OR "no additional documentation provided".  4. Click Sign note button.  5. The deficiency will fall out of your In Basket *Please let us know if you are not able to complete this workflow by phone or e-mail (listed below).  Please update your documentation within the medical record to reflect your response to this query.                                                                                        04/02/13   Dear Dr.[facility],  In a better effort to capture your patient's severity of illness, reflect appropriate length of stay and utilization of resources, a review of the patient medical record has revealed the following indicators.    Based on your clinical judgment, please clarify and document in a progress note and/or discharge summary the clinical condition associated with the following supporting information:  In responding to this query please exercise your independent judgment.  The fact that a query is asked, does not imply that any particular answer is desired or expected.  Nutritional consult on  04/01/13 indicates Severe malnutrition in the context of chronic illness.    Possible Clinical Conditions? _______Severe Malnutrition   _______Protein Calorie Malnutrition _______Severe Protein Calorie Malnutrition   _______Other Condition________________ _______Cannot clinically determine     Supporting Information: Risk Factors: Emesis, Diarrhea,  Acute renal failure  Signs &  Symptoms:"reports pt has not been eating for 2 days                                  pt was not eating/drinking including nausea/vomiting once in the morning for the past 2 days "                                "  pt was only eating 1 meal/day for the past 3 months. She stated this meal consisted only of bread, milk, and tea. "  c/o heartburn if he eats anything other than bread.        Ht :5 ft     Wt : 96 lbs  BMI:  18.77  Weight  Loss Subcutaneous Fat:  Orbital Region: mild/moderate wasting  Upper Arm Region: mild/moderate wasting  Thoracic and Lumbar Region: severe wasting  Muscle:  Temple Region: mild/moderate wasting  Clavicle Bone Region: WNL  Clavicle and Acromion Bone Region: WNL  Scapular Bone Region: N/A  Dorsal Hand: mild/moderate wasting  Patellar Region: WNL  Anterior Thigh Region: mild/moderate wasting  Posterior Calf Region: mild/moderate wasting      Calcium level:  Treatment INTERVENTION:  - Recommend MD order appropriate medications for heartburn  - Multivitamin 1 tablet PO daily  - Ensure Complete daily  - Recommend MD monitor for refeeding syndrome and monitor/replete pt's potassium, magnesium, and phosphorus  - If PO intake fails to improve after heartburn improves/resolve, recommend MD discuss enteral nutrition r/t pt's severe malnutrition  - Will continue to monitor     Nutrition Consult: 04/01/13 >Pt meets criteria for severe MALNUTRITION in the context of chronic illness as evidenced by <50% estimated energy intake for the past 3 months in addition to pt with visible severe muscle wasting and subcutaneous fat loss in thoracic and lumbar region    You may use possible, probable, or suspect with inpatient documentation. possible, probable, suspected diagnoses MUST be documented at the time of discharge  Reviewed: additional documentation in the medical record  Thank You,  Andy Gauss RN, BSN  Clinical Documentation Specialist:  Pager  [phone] Office [phone] [facility] Health Information Management [place]

## 2013-04-02 NOTE — Progress Notes (Signed)
Nutrition Brief Note  Pt in room with RN with sheet over his head. Daughter in room talking to RN. RN reports pt has consumed 100% of meals today. Pt had rice, milk, and orange juice for breakfast and cheese pizza, potatoes, collard greens, cranberry sauce, roll, and rice for lunch. RN planning to give pt Ensure in the afternoon. No diarrhea reported. Potassium, magnesium, and phosphorus WNL.    Potassium  Date/Time Value Range Status  04/02/2013  4:08 AM 4.4  3.5 - 5.1 mEq/L Final  04/01/2013  3:40 AM 4.3  3.5 - 5.1 mEq/L Final  03/31/2013  4:00 PM 5.1  3.5 - 5.1 mEq/L Final    Phosphorus  Date/Time Value Range Status  04/01/2013  3:40 AM 3.9  2.3 - 4.6 mg/dL Final    Magnesium  Date/Time Value Range Status  04/01/2013  3:40 AM 1.9  1.5 - 2.5 mg/dL Final    Levon Hedger MS, RD, LDN [phone] Pager [phone] After Hours Pager

## 2013-04-02 NOTE — Progress Notes (Signed)
Pt has order for blood transfusion. Notified that blood is ready. Pt needs to sign consent form but translation is needed for pt. Called pt's granddaughter to translate. Left a message on her phone to call me back. Will continue to monitor pt in the mean time.

## 2013-04-02 NOTE — Progress Notes (Signed)
Subjective: Patient is apparently feeling fine.  Objective: Vital signs in last 24 hours: Temp:  [98.5 F (36.9 C)-100.2 F (37.9 C)] 98.5 F (36.9 C) (04/10 0418) Pulse Rate:  [74-85] 76 (04/10 0418) Resp:  [16-18] 16 (04/10 0418) BP: (100-115)/(53-65) 115/65 mmHg (04/10 0418) SpO2:  [99 %-100 %] 99 % (04/10 0418)  Intake/Output from previous day: 04/09 0701 - 04/10 0700 In: 2166.7 [P.O.:700; I.V.:1466.7] Out: 1600 [Urine:1600] Intake/Output this shift:    Physical Exam:  Constitutional: Vital signs reviewed. WD WN in NAD   Eyes: PERRL, No scleral icterus.    Urine in drainage tubing is clear  Lab Results:  Recent Labs  03/31/13 1600 04/02/13 0408  HGB 10.7* 7.8*  HCT 31.8* 23.2*   BMET  Recent Labs  04/01/13 0340 04/02/13 0408  NA 133* 135  K 4.3 4.4  CL 103 108  CO2 19 19  GLUCOSE 89 95  BUN 40* 27*  CREATININE 1.83* 1.48*  CALCIUM 8.1* 7.8*   No results found for this basename: LABPT, INR,  in the last 72 hours No results found for this basename: LABURIN,  in the last 72 hours Results for orders placed during the hospital encounter of 03/31/13  CULTURE, BLOOD (SINGLE)     Status: None   Collection Time    03/31/13  4:00 PM      Result Value Range Status   Specimen Description BLOOD LEFT ARM   Final   Special Requests BOTTLES DRAWN AEROBIC AND ANAEROBIC 2CC   Final   Culture  Setup Time 03/31/2013 23:28   Final   Culture     Final   Value:        BLOOD CULTURE RECEIVED NO GROWTH TO DATE CULTURE WILL BE HELD FOR 5 DAYS BEFORE ISSUING A FINAL NEGATIVE REPORT   Report Status PENDING   Incomplete  URINE CULTURE     Status: None   Collection Time    03/31/13  5:00 PM      Result Value Range Status   Specimen Description URINE, CLEAN CATCH   Final   Special Requests NONE   Final   Culture  Setup Time 04/01/2013 04:34   Final   Colony Count >=100,000 COLONIES/ML   Final   Culture GRAM NEGATIVE RODS   Final   Report Status PENDING   Incomplete     Studies/Results: Dg Chest 2 View  03/31/2013  *RADIOLOGY REPORT*  Clinical Data: Fever, cough.  CHEST - 2 VIEW  Comparison: March 09, 2013.  Findings: Mild stable cardiomegaly.  No acute pulmonary disease is noted.  No pleural effusion or pneumothorax is noted.  Bony thorax is intact.  IMPRESSION: No acute cardiopulmonary abnormality seen.   Original Report Authenticated By: Lupita Raider.,  M.D.    US Renal  03/31/2013  *RADIOLOGY REPORT*  Clinical Data: Acute renal failure.  RENAL/URINARY TRACT ULTRASOUND COMPLETE  Comparison:  CT abdomen and pelvis without contrast 01/25/2013.  Findings:  Right Kidney:  8 mm hypoechoic lesion at the upper pole of the right kidney likely represents a small cyst.  Moderate right-sided hydronephrosis is present.  The maximal length is 9.0 cm.  No additional parenchymal lesions are present. There are no stones.  Left Kidney:  Moderate left-sided hydronephrosis is present.  The parenchymal thinning is evident.  The maximal length is 12.6 cm.  Bladder:  Marked bladder wall thickening is again noted.  The prostate is enlarged.  IMPRESSION:  1.  Moderate bilateral hydronephrosis is similar to  the prior exam. 2.  Marked bladder wall thickening compatible with chronic airway obstruction or a neurogenic bladder. 3.  Enlarged prostate gland.   Original Report Authenticated By: Marin Roberts, M.D.     Assessment/Plan:   Urinary tract infection, status post TURP, currently treated appropriately    Acute on chronic renal insufficiency secondary to bladder outlet obstruction. His creatinine is improving, most likely due to hydration and treating his UTI    Anemia, not due to blood loss. This may need treatment-will leave up to hospital service    Catheter should be left in at least one more day. I will followup.   LOS: 2 days   Marcine Matar M 04/02/2013, 7:14 AM

## 2013-04-02 NOTE — Evaluation (Signed)
Occupational Therapy Evaluation Patient Details Name: Martin Tanner MRN: [id number] DOB: 12-27-1927 Today's Date: 04/02/2013 Time: 4098-1191 OT Time Calculation (min): 18 min  OT Assessment / Plan / Recommendation Clinical Impression  This 79 year old man was admitted for fever, vomiting and incontinence.  He had TURP 2 weeks prior to admission.  Pt performs mobility and LB adls at min guard level for balance.  Will follow in acute only for balance related to ADLs    OT Assessment  Patient needs continued OT Services    Follow Up Recommendations  No OT follow up    Barriers to Discharge      Equipment Recommendations  None recommended by OT    Recommendations for Other Services    Frequency  Min 2X/week    Precautions / Restrictions Precautions Precautions: Fall Restrictions Weight Bearing Restrictions: No   Pertinent Vitals/Pain No c/o pain    ADL  Toilet Transfer: Simulated;Min guard Toilet Transfer Method: Sit to Barista:  (bed, ambulated in hall, then recliner) Transfers/Ambulation Related to ADLs: Pt ambulated with min A in hallway.  Pt unsteady/veered left and right and adducted legs but did not lose balance.  Educated family to encourage use of walker or to walk closely to guard him.  Pt pushed walker away when offered at beginning of eval.   ADL Comments: Pt will not need toilet DME.  He is very flexible and is min guard for balance to supervision for balance only with adls.  He can perform UB with set up.    OT Diagnosis: Generalized weakness  OT Problem List: Impaired balance (sitting and/or standing) OT Treatment Interventions: Self-care/ADL training;Patient/family education;Balance training   OT Goals Acute Rehab OT Goals OT Goal Formulation: With family Time For Goal Achievement: 04/16/13 Potential to Achieve Goals: Good ADL Goals Pt Will Transfer to Toilet: with supervision;Ambulation;Regular height toilet ADL Goal: Toilet  Transfer - Progress: Goal set today Pt Will Perform Tub/Shower Transfer: with supervision;Shower transfer;Shower seat with back ADL Goal: Web designer - Progress: Goal set today Miscellaneous OT Goals Miscellaneous OT Goal #1: Pt will gather clothes with RW and supervision then complete adl himself (set up) OT Goal: Miscellaneous Goal #1 - Progress: Goal set today  Visit Information  Last OT Received On: 04/02/13 Assistance Needed: +1 PT/OT Co-Evaluation/Treatment: Yes    Subjective Data  Subjective: Pt does not speak English--granddaughter interpreted.  Pt agreeable to OT/PT Patient Stated Goal: none stated   Prior Functioning     Home Living Lives With: Family Available Help at Discharge: Family Home Access: Stairs to enter Entrance Stairs-Number of Steps: 12 Entrance Stairs-Rails: Right Home Layout: One level Bathroom Shower/Tub: Health visitor: Standard Home Adaptive Equipment: Walker - rolling;Wheelchair - Architectural technologist with back (doesn't use) Prior Function Level of Independence: Independent (until 2 days prior to admission) Communication Communication: Prefers language other than English (granddaughter interpreted)         Vision/Perception     Electrical engineer - Other Comments: appears wfls--pt does not speak English    Extremity/Trunk Assessment Right Upper Extremity Assessment RUE ROM/Strength/Tone: Within functional levels Left Upper Extremity Assessment LUE ROM/Strength/Tone: Within functional levels     Mobility Bed Mobility Bed Mobility: Supine to Sit Supine to Sit: 7: Independent Transfers Transfers: Sit to Stand Sit to Stand: 5: Supervision     Exercise     Balance     End of Session OT - End of Session Activity Tolerance: Patient tolerated  treatment well Patient left: in chair;with call bell/phone within reach;with family/visitor present  GO     Larissa Pegg 04/02/2013, 12:08  PM Marica Otter, OTR/L [phone] 04/02/2013

## 2013-04-02 NOTE — Progress Notes (Addendum)
[facility] PROGRESS NOTE  Martin Tanner [place] WUJ:[id number] DOB: 1928-10-13 DOA: 03/31/2013  PCP: No PCP  Brief HPI: Patient is 79 year old man with past medical history most significant for recent admission for urinary retention status post transurethral resection of his prostate 2 weeks ago came in with chief complaints of fever, vomiting and urinary incontinence for 2 days. Patient's daughter-in-law reported that he was doing well until about 2 days prior to admission when he started having uncontrollable urination with frequency. She denied blood in his urine. Patient also had fevers since last 2 days but they did not measure temperature at home. Patient had not been able to keep anything down and vomited at least 3 times which contained watery stomach contents but no blood. She stated patient had loss of appetite. Patient denied having pain but appeared to be in some discomfort. She also described a dry cough without shortness of breath. Patient was found to be in acute renal failure with creatinine of 1.9 (baseline 1.1). Patient was also found to have elevated white count with left shift and urinalysis suggestive of urinary tract infection. Hospitalists team was asked to admit the patient for antibiotics and fluids.   Past medical history:  Past Medical History  Diagnosis Date  . BPH (benign prostatic hyperplasia)     Consultants: Dr. Retta Diones with Urology  Procedures: Foley was placed  Antibiotics: IV Ceftriaxone  Subjective: Patient feels better per his daughter who interpreted. Denies any bleeding in urine or stool. Did apparently have some rectal bleeding a few months ago which resolved with unknown over the counter medication. No nausea or vomiting.   Objective: Vital Signs  Filed Vitals:   04/01/13 1322 04/01/13 1428 04/01/13 2036 04/02/13 0418  BP: 104/58  100/53 115/65  Pulse: 85  74 76  Temp: 100.2 F (37.9 C) 99.8 F (37.7 C) 99.1 F (37.3 C) 98.5 F (36.9 C)   TempSrc: Oral Oral Oral Oral  Resp: 16  18 16   Height:      Weight:      SpO2: 100%  100% 99%    Intake/Output Summary (Last 24 hours) at 04/02/13 1247 Last data filed at 04/02/13 0418  Gross per 24 hour  Intake 1926.67 ml  Output   1500 ml  Net 426.67 ml   Filed Weights   03/31/13 2046  Weight: 43.6 kg (96 lb 1.9 oz)    General appearance: alert, cooperative, appears stated age and no distress Resp: clear to auscultation bilaterally Cardio: regular rate and rhythm, S1, S2 normal, no murmur, click, rub or gallop GI: soft, non-tender; bowel sounds normal; no masses,  no organomegaly Extremities: extremities normal, atraumatic, no cyanosis or edema Pulses: 2+ and symmetric Lymph nodes: Cervical, supraclavicular, and axillary nodes normal. Neurologic: Alert. No focal deficits.  Lab Results:  Basic Metabolic Panel:  Recent Labs Lab 03/31/13 1600 04/01/13 0340 04/02/13 0408  NA 131* 133* 135  K 5.1 4.3 4.4  CL 97 103 108  CO2 19 19 19   GLUCOSE 89 89 95  BUN 40* 40* 27*  CREATININE 1.90* 1.83* 1.48*  CALCIUM 9.1 8.1* 7.8*  MG  --  1.9  --   PHOS  --  3.9  --    Liver Function Tests:  Recent Labs Lab 03/31/13 1600  AST 20  ALT 11  ALKPHOS 81  BILITOT 0.6  PROT 8.7*  ALBUMIN 3.5   CBC:  Recent Labs Lab 03/31/13 1600 04/02/13 0408  WBC 11.1* 4.7  NEUTROABS 9.1*  --  HGB 10.7* 7.8*  HCT 31.8* 23.2*  MCV 85.7 85.9  PLT 234 149*    Recent Results (from the past 240 hour(s))  CULTURE, BLOOD (SINGLE)     Status: None   Collection Time    03/31/13  4:00 PM      Result Value Range Status   Specimen Description BLOOD LEFT ARM   Final   Special Requests BOTTLES DRAWN AEROBIC AND ANAEROBIC 2CC   Final   Culture  Setup Time 03/31/2013 23:28   Final   Culture     Final   Value:        BLOOD CULTURE RECEIVED NO GROWTH TO DATE CULTURE WILL BE HELD FOR 5 DAYS BEFORE ISSUING A FINAL NEGATIVE REPORT   Report Status PENDING   Incomplete  URINE CULTURE      Status: None   Collection Time    03/31/13  5:00 PM      Result Value Range Status   Specimen Description URINE, CLEAN CATCH   Final   Special Requests NONE   Final   Culture  Setup Time 04/01/2013 04:34   Final   Colony Count >=100,000 COLONIES/ML   Final   Culture GRAM NEGATIVE RODS   Final   Report Status PENDING   Incomplete      Studies/Results: Dg Chest 2 View  03/31/2013  *RADIOLOGY REPORT*  Clinical Data: Fever, cough.  CHEST - 2 VIEW  Comparison: March 09, 2013.  Findings: Mild stable cardiomegaly.  No acute pulmonary disease is noted.  No pleural effusion or pneumothorax is noted.  Bony thorax is intact.  IMPRESSION: No acute cardiopulmonary abnormality seen.   Original Report Authenticated By: Lupita Raider.,  M.D.    US Renal  03/31/2013  *RADIOLOGY REPORT*  Clinical Data: Acute renal failure.  RENAL/URINARY TRACT ULTRASOUND COMPLETE  Comparison:  CT abdomen and pelvis without contrast 01/25/2013.  Findings:  Right Kidney:  8 mm hypoechoic lesion at the upper pole of the right kidney likely represents a small cyst.  Moderate right-sided hydronephrosis is present.  The maximal length is 9.0 cm.  No additional parenchymal lesions are present. There are no stones.  Left Kidney:  Moderate left-sided hydronephrosis is present.  The parenchymal thinning is evident.  The maximal length is 12.6 cm.  Bladder:  Marked bladder wall thickening is again noted.  The prostate is enlarged.  IMPRESSION:  1.  Moderate bilateral hydronephrosis is similar to the prior exam. 2.  Marked bladder wall thickening compatible with chronic airway obstruction or a neurogenic bladder. 3.  Enlarged prostate gland.   Original Report Authenticated By: Marin Roberts, M.D.     Medications:  Scheduled: . cefTRIAXone (ROCEPHIN)  IV  1 g Intravenous Q24H  . enoxaparin (LOVENOX) injection  30 mg Subcutaneous Q24H  . feeding supplement  237 mL Oral Q1400  . multivitamin with minerals  1 tablet Oral Daily  .  pantoprazole  40 mg Oral Daily   Continuous: . sodium chloride 100 mL/hr (04/02/13 0610)   WUJ:[id number], ondansetron (ZOFRAN) IV, ondansetron  Assessment/Plan:  Principal Problem:   ARF (acute renal failure) Active Problems:   UTI (lower urinary tract infection)   Anemia   Weakness generalized   Hyponatremia    Acute renal failure due to Prerenal Azotemia and Hyponatremia Improving with IVF. Continue at lower rate. Monitor UO.  UTI Continue Ceftriaxone. Await Urine culture  History of BPH with BOO status post TURP recently US shows moderate hydronephrosis which is stable from before.  Discussed with Dr. Lynnae Sandhoff. No new recommendations. Will remove foley in AM.   Generalized Weakness Probably due to above. Daughter reports poor oral intake at home. She reports that he gets acid reflux. Will initiate PPI. PT/OT eval.  Normocytic Anemia Baseline Hgb appears to be around 8-9. Since he has weakness will transfuse 1 unit of blood. FOBT. No overt blood loss noted. Drop is likely dilutional. He will need further work up for rectal bleeding that he had a few months ago but since he is not actively bleeding this can be done as OP.  Severe malnutrition Ensure  Code Status Full Code  DVT Prophylaxis Enoxaparin  Family Communication: Discussed with daughter  Disposition Plan: Await improvement. Anticipate discharge 4/11.    LOS: 2 days   [facility]  [facility] Pager [phone] 04/02/2013, 12:47 PM  If 8PM-8AM, please contact night-coverage at [email or web address], password [facility]

## 2013-04-02 NOTE — Evaluation (Signed)
Physical Therapy Evaluation Patient Details Name: Martin Tanner MRN: [id number] DOB: 02/02/1928 Today's Date: 04/02/2013 Time: 4098-1191 PT Time Calculation (min): 16 min  PT Assessment / Plan / Recommendation Clinical Impression  Pt. is 79 yo male who does not speak [place], admitted 03/31/13 with fever, ARF, recent TURP/ Per granddaughter, pt ws independent PTA. Pt will benefit from PT while in acute care.    PT Assessment  Patient needs continued PT services    Follow Up Recommendations  No PT follow up    Does the patient have the potential to tolerate intense rehabilitation      Barriers to Discharge        Equipment Recommendations  None recommended by PT    Recommendations for Other Services     Frequency Min 3X/week    Precautions / Restrictions Precautions Precautions: Fall Restrictions Weight Bearing Restrictions: No   Pertinent Vitals/Pain       Mobility  Bed Mobility Bed Mobility: Supine to Sit Supine to Sit: 7: Independent Transfers Sit to Stand: 5: Supervision Ambulation/Gait Ambulation/Gait Assistance: 4: Min guard Ambulation Distance (Feet): 400 Feet Ambulation/Gait Assistance Details: occassional contact/minguard as pt. swayed /veered to sides, never to point of falling. Gait Pattern: Step-through pattern Gait velocity: fast    Exercises     PT Diagnosis: Abnormality of gait;Generalized weakness  PT Problem List: Decreased strength;Decreased balance;Decreased activity tolerance PT Treatment Interventions: DME instruction;Gait training;Stair training;Functional mobility training;Therapeutic activities;Patient/family education   PT Goals Acute Rehab PT Goals PT Goal Formulation: With family Time For Goal Achievement: 04/16/13 Potential to Achieve Goals: Good Pt will Ambulate: >150 feet;Independently PT Goal: Ambulate - Progress: Goal set today Pt will Go Up / Down Stairs: Flight;with rail(s);with supervision PT Goal: Up/Down Stairs -  Progress: Goal set today  Visit Information  Last PT Received On: 04/02/13 Assistance Needed: +1    Subjective Data  Subjective: Pt. does not speak english. Grandaughter interpreted. Patient Stated Goal: per family--go home.   Prior Functioning  Home Living Lives With: Family Available Help at Discharge: Family Home Access: Stairs to enter Secretary/administrator of Steps: 12 Entrance Stairs-Rails: Right Home Layout: One level Bathroom Shower/Tub: Health visitor: Standard Home Adaptive Equipment: Walker - rolling;Wheelchair - Architectural technologist with back (doesn't use) Prior Function Level of Independence: Independent (until 2 days prior to admission) Communication Communication: Prefers language other than English (granddaughter interpreted)    Electrical engineer - Other Comments: appears wfls--pt does not speak English    Extremity/Trunk Assessment Right Upper Extremity Assessment RUE ROM/Strength/Tone: Within functional levels Left Upper Extremity Assessment LUE ROM/Strength/Tone: Within functional levels Right Lower Extremity Assessment RLE ROM/Strength/Tone: WFL for tasks assessed Left Lower Extremity Assessment LLE ROM/Strength/Tone: WFL for tasks assessed   Balance Balance Balance Assessed: Yes Static Standing Balance Static Standing - Balance Support: No upper extremity supported Static Standing - Level of Assistance: 5: Stand by assistance  End of Session PT - End of Session Activity Tolerance: Patient tolerated treatment well Patient left: in chair;with call bell/phone within reach;with family/visitor present Nurse Communication: Mobility status  GP     [name] 04/02/2013, 1:36 PM  Blanchard Kelch PT [phone]

## 2013-04-03 LAB — CBC
MCV: 83.8 fL (ref 78.0–100.0)
Platelets: 166 10*3/uL (ref 150–400)
RBC: 3.59 MIL/uL — ABNORMAL LOW (ref 4.22–5.81)
RDW: 14 % (ref 11.5–15.5)
WBC: 5 10*3/uL (ref 4.0–10.5)

## 2013-04-03 LAB — URINE CULTURE

## 2013-04-03 LAB — TYPE AND SCREEN
ABO/RH(D): O POS
Antibody Screen: NEGATIVE

## 2013-04-03 LAB — BASIC METABOLIC PANEL
CO2: 22 mEq/L (ref 19–32)
Calcium: 8.5 mg/dL (ref 8.4–10.5)
Chloride: 108 mEq/L (ref 96–112)
Creatinine, Ser: 1.24 mg/dL (ref 0.50–1.35)
GFR calc Af Amer: 60 mL/min — ABNORMAL LOW (ref >90)
Sodium: 138 mEq/L (ref 135–145)

## 2013-04-03 NOTE — Progress Notes (Signed)
[facility] PROGRESS NOTE  Martin Tanner [place] ZOX:[id number] DOB: 1928/08/03 DOA: 03/31/2013  PCP: No PCP  Brief HPI: Patient is 79 year old man with past medical history most significant for recent admission for urinary retention status post transurethral resection of his prostate 2 weeks ago came in with chief complaints of fever, vomiting and urinary incontinence for 2 days. Patient's daughter-in-law reported that he was doing well until about 2 days prior to admission when he started having uncontrollable urination with frequency. She denied blood in his urine. Patient also had fevers since last 2 days but they did not measure temperature at home. Patient had not been able to keep anything down and vomited at least 3 times which contained watery stomach contents but no blood. She stated patient had loss of appetite. Patient denied having pain but appeared to be in some discomfort. She also described a dry cough without shortness of breath. Patient was found to be in acute renal failure with creatinine of 1.9 (baseline 1.1). Patient was also found to have elevated white count with left shift and urinalysis suggestive of urinary tract infection. Hospitalists team was asked to admit the patient for antibiotics and fluids.   Past medical history:  Past Medical History  Diagnosis Date  . BPH (benign prostatic hyperplasia)     Consultants: Dr. Retta Diones with Urology  Procedures: Foley was placed and then removed 4/11  Antibiotics: IV Ceftriaxone  Subjective: Patient continues to feel better. No complaints offered. Tolerating his diet well.  Objective: Vital Signs  Filed Vitals:   04/02/13 1900 04/02/13 2000 04/02/13 2100 04/03/13 0405  BP: 117/69 115/60 114/63 137/76  Pulse: 74 70 74 64  Temp: 98.8 F (37.1 C) 98.6 F (37 C) 98.5 F (36.9 C) 98.5 F (36.9 C)  TempSrc: Oral Oral Oral Oral  Resp: 16 18 16 18   Height:      Weight:      SpO2:    100%    Intake/Output Summary  (Last 24 hours) at 04/03/13 0914 Last data filed at 04/03/13 0405  Gross per 24 hour  Intake  912.5 ml  Output   2501 ml  Net -1588.5 ml   Filed Weights   03/31/13 2046  Weight: 43.6 kg (96 lb 1.9 oz)    General appearance: alert, cooperative, appears stated age and no distress Resp: clear to auscultation bilaterally Cardio: regular rate and rhythm, S1, S2 normal, no murmur, click, rub or gallop GI: soft, non-tender; bowel sounds normal; no masses,  no organomegaly Extremities: extremities normal, atraumatic, no cyanosis or edema Pulses: 2+ and symmetric Lymph nodes: Cervical, supraclavicular, and axillary nodes normal. Neurologic: Alert. No focal deficits.  Lab Results:  Basic Metabolic Panel:  Recent Labs Lab 03/31/13 1600 04/01/13 0340 04/02/13 0408 04/03/13 0350  NA 131* 133* 135 138  K 5.1 4.3 4.4 4.9  CL 97 103 108 108  CO2 19 19 19 22   GLUCOSE 89 89 95 104*  BUN 40* 40* 27* 18  CREATININE 1.90* 1.83* 1.48* 1.24  CALCIUM 9.1 8.1* 7.8* 8.5  MG  --  1.9  --   --   PHOS  --  3.9  --   --    Liver Function Tests:  Recent Labs Lab 03/31/13 1600  AST 20  ALT 11  ALKPHOS 81  BILITOT 0.6  PROT 8.7*  ALBUMIN 3.5   CBC:  Recent Labs Lab 03/31/13 1600 04/02/13 0408 04/03/13 0350  WBC 11.1* 4.7 5.0  NEUTROABS 9.1*  --   --  HGB 10.7* 7.8* 10.1*  HCT 31.8* 23.2* 30.1*  MCV 85.7 85.9 83.8  PLT 234 149* 166    Recent Results (from the past 240 hour(s))  CULTURE, BLOOD (SINGLE)     Status: None   Collection Time    03/31/13  4:00 PM      Result Value Range Status   Specimen Description BLOOD LEFT ARM   Final   Special Requests BOTTLES DRAWN AEROBIC AND ANAEROBIC 2CC   Final   Culture  Setup Time 03/31/2013 23:28   Final   Culture     Final   Value:        BLOOD CULTURE RECEIVED NO GROWTH TO DATE CULTURE WILL BE HELD FOR 5 DAYS BEFORE ISSUING A FINAL NEGATIVE REPORT   Report Status PENDING   Incomplete  URINE CULTURE     Status: None   Collection  Time    03/31/13  5:00 PM      Result Value Range Status   Specimen Description URINE, CLEAN CATCH   Final   Special Requests NONE   Final   Culture  Setup Time 04/01/2013 04:34   Final   Colony Count >=100,000 COLONIES/ML   Final   Culture GRAM NEGATIVE RODS   Final   Report Status PENDING   Incomplete      Studies/Results: No results found.  Medications:  Scheduled: . cefTRIAXone (ROCEPHIN)  IV  1 g Intravenous Q24H  . enoxaparin (LOVENOX) injection  30 mg Subcutaneous Q24H  . feeding supplement  237 mL Oral Q1400  . multivitamin with minerals  1 tablet Oral Daily  . pantoprazole  40 mg Oral Daily   Continuous: . sodium chloride 100 mL/hr (04/02/13 0610)   ZOX:[id number], ondansetron (ZOFRAN) IV, ondansetron  Assessment/Plan:  Principal Problem:   ARF (acute renal failure) Active Problems:   UTI (lower urinary tract infection)   Anemia   Weakness generalized   Hyponatremia    Acute renal failure due to Prerenal Azotemia and Hyponatremia Resolved with IVF. Monitor UO.  UTI Continue Ceftriaxone. Urine culture reports gram neg rods. Final ID is pending.  History of BPH with BOO status post TURP recently US shows moderate hydronephrosis which is stable from before. Discussed with Dr. Lynnae Sandhoff. No new recommendations. Foley has been removed. Further management per Urology. They plan to check post void residuals etc.  Generalized Weakness Probably due to above. Has improved. Daughter reports poor oral intake at home. She reports that he gets acid reflux. Continue PPI. PT/OT has seen.  Normocytic Anemia Baseline Hgb appears to be around 8-9. Since he has weakness he was transfused 1 unit of blood and his hemoglobin has improved. FOBT is pending but no overt bleeding. Drop was likely dilutional. He will need further work up for rectal bleeding that he had a few months ago but since he is not actively bleeding this can be done as OP.  Severe  malnutrition Ensure  Code Status Full Code  DVT Prophylaxis Enoxaparin  Family Communication: Discussed with daughter. Will have CM assist with getting a PCP. Disposition Plan: Anticipate discharge 4/12.    LOS: 3 days   [facility]  [facility] Pager [phone] 04/03/2013, 9:14 AM  If 8PM-8AM, please contact night-coverage at [email or web address], password [facility]

## 2013-04-03 NOTE — Progress Notes (Signed)
Physical Therapy Treatment Patient Details Name: Martin Tanner MRN: [id number] DOB: 1928-05-21 Today's Date: 04/03/2013 Time: 1478-2956 PT Time Calculation (min): 10 min  PT Assessment / Plan / Recommendation Comments on Treatment Session  Pt. walked most of the 3rd floor, without an assistive device, LOB or SOB. His HR during gait was 56bpm.     Follow Up Recommendations  No PT follow up     Equipment Recommendations  None recommended by PT    Frequency Min 3X/week   Plan Discharge plan remains appropriate    Precautions / Restrictions Precautions Precautions: Fall Restrictions Weight Bearing Restrictions: No   Pertinent Vitals/Pain Pt denies pain    Mobility  Bed Mobility Bed Mobility: Supine to Sit Supine to Sit: 7: Independent Transfers Transfers: Sit to Stand;Stand to Sit Sit to Stand: 5: Supervision Ambulation/Gait Ambulation/Gait Assistance: 4: Min guard Ambulation Distance (Feet): 500 Feet + Assistive device: None Ambulation/Gait Assistance Details: Pt was guarded to ensure safety, tho he had no LOB Gait Pattern: Within Functional Limits Gait velocity: fast General Gait Details: Pt. ambulated quickly and confidently tho min VC's were needed to curb impulsivity and to remind pt. to hold onto stair rails Stairs: Yes Stairs Assistance: 4: Min guard Stair Management Technique: Alternating pattern;Forwards Number of Stairs: 25  Cueing to use rail for increased safety      PT Goals Acute Rehab PT Goals PT Goal Formulation: With family Time For Goal Achievement: 04/16/13 Potential to Achieve Goals: Good Pt will Ambulate: >150 feet;Independently PT Goal: Ambulate - Progress: Progressing toward goal Pt will Go Up / Down Stairs: Flight;with rail(s);with supervision PT Goal: Up/Down Stairs - Progress: Progressing toward goal  Visit Information  Last PT Received On: 04/03/13 Assistance Needed: +1    Cognition    Good   Balance   Good  End of Session  PT - End of Session Equipment Utilized During Treatment: Gait belt Activity Tolerance: Patient tolerated treatment well Patient left: in bed;with call bell/phone within reach;with family/visitor present   GP     BROWN-SMEDLEY, NICOLE 04/03/2013, 12:51 PM  Felecia Shelling  PTA [facility]  Acute  Rehab Pager      [phone]

## 2013-04-03 NOTE — Progress Notes (Addendum)
Pt foley was removed at 0830 this morning. Since removal we have been monitoring output and residual.  1015: Pt voided and had residual of 82mL  1115: Pt voided and had residual of 87mL  1250: Pt voided and had residual of 46mL

## 2013-04-03 NOTE — Progress Notes (Signed)
ts   Subjective: Patient seems to be resting comfortably Objective: Vital signs in last 24 hours: Temp:  [97.9 F (36.6 C)-99 F (37.2 C)] 98.5 F (36.9 C) (04/11 0405) Pulse Rate:  [64-74] 64 (04/11 0405) Resp:  [16-18] 18 (04/11 0405) BP: (103-137)/(58-76) 137/76 mmHg (04/11 0405) SpO2:  [100 %] 100 % (04/11 0405)  Intake/Output from previous day: 04/10 0701 - 04/11 0700 In: 912.5 [P.O.:600; Blood:312.5] Out: 2501 [Urine:2500; Stool:1] Intake/Output this shift: Total I/O In: 410 [P.O.:360; Blood:50] Out: 1300 [Urine:1300]  Physical Exam:  Constitutional: Vital signs reviewed. WD WN in NAD   Eyes: PERRL, No scleral icterus.   Pulmonary/Chest: Normal effort Abdominal: Soft. Non-tender, non-distended, bowel sounds are normal, no masses, organomegaly, or guarding present.    Lab Results:  Recent Labs  03/31/13 1600 04/02/13 0408 04/03/13 0350  HGB 10.7* 7.8* 10.1*  HCT 31.8* 23.2* 30.1*   BMET  Recent Labs  04/02/13 0408 04/03/13 0350  NA 135 138  K 4.4 4.9  CL 108 108  CO2 19 22  GLUCOSE 95 104*  BUN 27* 18  CREATININE 1.48* 1.24  CALCIUM 7.8* 8.5   No results found for this basename: LABPT, INR,  in the last 72 hours No results found for this basename: LABURIN,  in the last 72 hours Results for orders placed during the hospital encounter of 03/31/13  CULTURE, BLOOD (SINGLE)     Status: None   Collection Time    03/31/13  4:00 PM      Result Value Range Status   Specimen Description BLOOD LEFT ARM   Final   Special Requests BOTTLES DRAWN AEROBIC AND ANAEROBIC 2CC   Final   Culture  Setup Time 03/31/2013 23:28   Final   Culture     Final   Value:        BLOOD CULTURE RECEIVED NO GROWTH TO DATE CULTURE WILL BE HELD FOR 5 DAYS BEFORE ISSUING A FINAL NEGATIVE REPORT   Report Status PENDING   Incomplete  URINE CULTURE     Status: None   Collection Time    03/31/13  5:00 PM      Result Value Range Status   Specimen Description URINE, CLEAN CATCH    Final   Special Requests NONE   Final   Culture  Setup Time 04/01/2013 04:34   Final   Colony Count >=100,000 COLONIES/ML   Final   Culture GRAM NEGATIVE RODS   Final   Report Status PENDING   Incomplete    Studies/Results: No results found.  Assessment/Plan:   1. Symptomatic urinary tract infection following TURP. This is being treated appropriately. Gram-negative rods in urine, identification and sensitivities pending    2. Bilateral hydronephrosis, most likely long-standing, creatinine improving    I will order catheter to be removed this morning. I will order bladder scans to check emptying    If he is voiding adequately, and emptying well, I think he could go home as soon as tomorrow. I will keep a check on him.   LOS: 3 days   Marcine Matar M 04/03/2013, 6:24 AM

## 2013-04-03 NOTE — Progress Notes (Signed)
Paged Victorino Dike (Dr. Dehlstedt's nurse) to report pt's void amounts and residual. She will report to MD. Will continue to monitor.

## 2013-04-04 LAB — BASIC METABOLIC PANEL
Calcium: 8.6 mg/dL (ref 8.4–10.5)
Creatinine, Ser: 1.17 mg/dL (ref 0.50–1.35)
GFR calc Af Amer: 65 mL/min — ABNORMAL LOW (ref >90)
GFR calc non Af Amer: 56 mL/min — ABNORMAL LOW (ref >90)

## 2013-04-04 LAB — CBC
Platelets: 181 10*3/uL (ref 150–400)
RDW: 14 % (ref 11.5–15.5)
WBC: 5 10*3/uL (ref 4.0–10.5)

## 2013-04-04 MED ORDER — OMEPRAZOLE 20 MG PO CPDR: 20.0000 mg | DELAYED_RELEASE_CAPSULE | Freq: Every day | ORAL | Status: DC

## 2013-04-04 MED ORDER — CEPHALEXIN 500 MG PO CAPS
500.0000 mg | ORAL_CAPSULE | Freq: Four times a day (QID) | ORAL | Status: DC
  Administered 2013-04-04: 500 mg via ORAL
  Filled 2013-04-04 (×8): qty 1

## 2013-04-04 MED ORDER — CEPHALEXIN 500 MG PO CAPS: 500.0000 mg | ORAL_CAPSULE | Freq: Three times a day (TID) | ORAL | Status: DC

## 2013-04-04 NOTE — Discharge Summary (Signed)
[facility]  Physician Discharge Summary   Patient ID: Martin Tanner MRN: [id number] DOB/AGE: February 18, 1928 79 y.o.  Admit date: 03/31/2013 Discharge date: 04/04/2013  PCP: Patient doesn't have a PCP.  DISCHARGE DIAGNOSES:  Principal Problem:   ARF (acute renal failure) Active Problems:   UTI (lower urinary tract infection)   Anemia   Weakness generalized   Hyponatremia   RECOMMENDATIONS FOR OUTPATIENT FOLLOW UP: 1. Home health RN 2. His daughter has been explained that he needs to see a PCP for history of rectal bleeding  DISCHARGE CONDITION: fair  Diet recommendation: Low Sodium  Filed Weights   03/31/13 2046  Weight: 43.6 kg (96 lb 1.9 oz)    INITIAL HISTORY: Patient is 79 year old man with past medical history most significant for recent admission for urinary retention status post transurethral resection of his prostate 2 weeks ago came in with chief complaints of fever, vomiting and urinary incontinence for 2 days. Patient's daughter-in-law reported that he was doing well until about 2 days prior to admission when he started having uncontrollable urination with frequency. She denied blood in his urine. Patient also had fevers since last 2 days but they did not measure temperature at home. Patient had not been able to keep anything down and vomited at least 3 times which contained watery stomach contents but no blood. She stated patient had loss of appetite. Patient denied having pain but appeared to be in some discomfort. She also described a dry cough without shortness of breath. Patient was found to be in acute renal failure with creatinine of 1.9 (baseline 1.1). Patient was also found to have elevated white count with left shift and urinalysis suggestive of urinary tract infection. Hospitalists team was asked to admit the patient for antibiotics and fluids.   Consultations:  Dr. Lynnae Sandhoff  Procedures:  Foley was placed and removed.  HOSPITAL COURSE:   During  this hospitalization his daughter has interpreted as patient doesn't speak [place].  Acute renal failure due to Prerenal Azotemia and Hyponatremia  This resolved with IVF. Was likely due to UTi and poor oral intake. He was told to not take motrin or similar medications.  UTI with Klebsiella He was initially started on Ceftriaxone. Culture reports show Klebsiella sensitive to Cefazolin. Patient will be discharged on Keflex. He is without symptoms currently.   History of BPH with BOO status post TURP recently  US showed moderate hydronephrosis which is stable from before. Discussed with Dr. Lynnae Sandhoff and he has been following patient. Foley was placed initially but then was removed as he improved. Post void residuals were less than . Patient to follow up with Dr. Lynnae Sandhoff.   Generalized Weakness  Probably due to above. This has improved. Daughter reported poor oral intake at home. She reports that he gets acid reflux. He was started on PPI. He was seen by PT/OT and they have cleared him. Home health Rb will be arranged.   Normocytic Anemia  Baseline Hgb appears to be around 8-9. Since he has weakness he was transfused 1 unit of blood and his hemoglobin has improved from 7.8 to 10.3. FOBT was ordered but not done. He did not have acute bleeding. Drop was likely dilutional. But his daughter did report that patient had some bleeding a few months ago. He will need further work up for the same. He will need to see a PCP and will need a colonoscopy. He was told to seek medical attention if he has rectal bleeding again. Since he is  not actively bleeding this can be done as OP.   Overall patient remains stable. He is ready for discharge.   PERTINENT LABS:  The results of significant diagnostics from this hospitalization (including imaging, microbiology, ancillary and laboratory) are listed below for reference.    Microbiology: Recent Results (from the past 240 hour(s))  CULTURE, BLOOD (SINGLE)      Status: None   Collection Time    03/31/13  4:00 PM      Result Value Range Status   Specimen Description BLOOD LEFT ARM   Final   Special Requests BOTTLES DRAWN AEROBIC AND ANAEROBIC 2CC   Final   Culture  Setup Time 03/31/2013 23:28   Final   Culture     Final   Value:        BLOOD CULTURE RECEIVED NO GROWTH TO DATE CULTURE WILL BE HELD FOR 5 DAYS BEFORE ISSUING A FINAL NEGATIVE REPORT   Report Status PENDING   Incomplete  URINE CULTURE     Status: None   Collection Time    03/31/13  5:00 PM      Result Value Range Status   Specimen Description URINE, CLEAN CATCH   Final   Special Requests NONE   Final   Culture  Setup Time 04/01/2013 04:34   Final   Colony Count >=100,000 COLONIES/ML   Final   Culture KLEBSIELLA OXYTOCA   Final   Report Status 04/03/2013 FINAL   Final   Organism ID, Bacteria KLEBSIELLA OXYTOCA   Final     Labs: Basic Metabolic Panel:  Recent Labs Lab 03/31/13 1600 04/01/13 0340 04/02/13 0408 04/03/13 0350 04/04/13 0408  NA 131* 133* 135 138 140  K 5.1 4.3 4.4 4.9 5.1  CL 97 103 108 108 110  CO2 19 19 19 22 23   GLUCOSE 89 89 95 104* 92  BUN 40* 40* 27* 18 16  CREATININE 1.90* 1.83* 1.48* 1.24 1.17  CALCIUM 9.1 8.1* 7.8* 8.5 8.6  MG  --  1.9  --   --   --   PHOS  --  3.9  --   --   --    Liver Function Tests:  Recent Labs Lab 03/31/13 1600  AST 20  ALT 11  ALKPHOS 81  BILITOT 0.6  PROT 8.7*  ALBUMIN 3.5   CBC:  Recent Labs Lab 03/31/13 1600 04/02/13 0408 04/03/13 0350 04/04/13 0408  WBC 11.1* 4.7 5.0 5.0  NEUTROABS 9.1*  --   --   --   HGB 10.7* 7.8* 10.1* 10.3*  HCT 31.8* 23.2* 30.1* 30.8*  MCV 85.7 85.9 83.8 85.1  PLT 234 149* 166 181    IMAGING STUDIES Dg Chest 2 View  03/31/2013  *RADIOLOGY REPORT*  Clinical Data: Fever, cough.  CHEST - 2 VIEW  Comparison: March 09, 2013.  Findings: Mild stable cardiomegaly.  No acute pulmonary disease is noted.  No pleural effusion or pneumothorax is noted.  Bony thorax is intact.   IMPRESSION: No acute cardiopulmonary abnormality seen.   Original Report Authenticated By: Lupita Raider.,  M.D.    US Renal  03/31/2013  *RADIOLOGY REPORT*  Clinical Data: Acute renal failure.  RENAL/URINARY TRACT ULTRASOUND COMPLETE  Comparison:  CT abdomen and pelvis without contrast 01/25/2013.  Findings:  Right Kidney:  8 mm hypoechoic lesion at the upper pole of the right kidney likely represents a small cyst.  Moderate right-sided hydronephrosis is present.  The maximal length is 9.0 cm.  No additional parenchymal lesions are  present. There are no stones.  Left Kidney:  Moderate left-sided hydronephrosis is present.  The parenchymal thinning is evident.  The maximal length is 12.6 cm.  Bladder:  Marked bladder wall thickening is again noted.  The prostate is enlarged.  IMPRESSION:  1.  Moderate bilateral hydronephrosis is similar to the prior exam. 2.  Marked bladder wall thickening compatible with chronic airway obstruction or a neurogenic bladder. 3.  Enlarged prostate gland.   Original Report Authenticated By: Marin Roberts, M.D.     DISCHARGE EXAMINATION: Filed Vitals:   04/03/13 1455 04/03/13 2058 04/03/13 2200 04/04/13 0537  BP: 117/62 107/65  123/75  Pulse: 71 72 70 62  Temp: 97.4 F (36.3 C) 97.9 F (36.6 C)  97.7 F (36.5 C)  TempSrc: Oral Oral  Oral  Resp: 18 18  18   Height:      Weight:      SpO2: 100% 100%  100%   General appearance: alert, cooperative and no distress Resp: clear to auscultation bilaterally Cardio: regular rate and rhythm, S1, S2 normal, no murmur, click, rub or gallop GI: soft, non-tender; bowel sounds normal; no masses,  no organomegaly Neurologic: Alert and oriented X 3, normal strength and tone. Normal symmetric reflexes. Normal coordination and gait  DISPOSITION: Home with Home health  Discharge Orders   Future Orders Complete By Expires     Diet - low sodium heart healthy  As directed     Discharge instructions  As directed      Comments:      Please follow up with Dr. Lynnae Sandhoff next week. Please make appointment with a primary care physician for the blood in stool that you have had in the past. You may need a colonoscopy. If you bleed at any time seek attention by calling a doctor or going to the hospital. Do not take ibuprofen or motrin for pain. Take alternative over the counter medications such as Acetaminophen.    Increase activity slowly  As directed       Current Discharge Medication List    START taking these medications   Details  cephALEXin (KEFLEX) 500 MG capsule Take 1 capsule (500 mg total) by mouth 3 (three) times daily. For 10 days starting today at [facility]: 30 capsule, Refills: 0    omeprazole (PRILOSEC) 20 MG capsule Take 1 capsule (20 mg total) by mouth daily. Qty: 30 capsule, Refills: 0      STOP taking these medications     ibuprofen (ADVIL,MOTRIN) 200 MG tablet        Follow-up Information   Follow up with DAHLSTEDT, Bertram Millard, MD. Schedule an appointment as soon as possible for a visit in 5 days.   Contact information:   [address] [place] [address] [phone]       TOTAL DISCHARGE TIME: 35 mins  [facility]  [facility] Pager [phone]  04/04/2013, 9:06 AM

## 2013-04-04 NOTE — Progress Notes (Signed)
Patient discharged to home via wheelchair, discharge instructions reviewed with family who speaks [place], family verbalized understanding. RX's given to family.

## 2013-04-06 LAB — CULTURE, BLOOD (SINGLE)

## 2014-02-12 ENCOUNTER — Ambulatory Visit: Payer: Medicaid Other

## 2014-02-23 ENCOUNTER — Ambulatory Visit: Payer: Medicaid Other | Attending: Internal Medicine | Admitting: Internal Medicine

## 2014-02-23 ENCOUNTER — Encounter: Payer: Self-pay | Admitting: Internal Medicine

## 2014-02-23 VITALS — BP 130/85 | HR 82 | Resp 14

## 2014-02-23 DIAGNOSIS — M6281 Muscle weakness (generalized): Secondary | ICD-10-CM | POA: Insufficient documentation

## 2014-02-23 DIAGNOSIS — R109 Unspecified abdominal pain: Secondary | ICD-10-CM | POA: Insufficient documentation

## 2014-02-23 DIAGNOSIS — R5381 Other malaise: Secondary | ICD-10-CM

## 2014-02-23 DIAGNOSIS — Z9181 History of falling: Secondary | ICD-10-CM

## 2014-02-23 DIAGNOSIS — R296 Repeated falls: Secondary | ICD-10-CM

## 2014-02-23 DIAGNOSIS — F17200 Nicotine dependence, unspecified, uncomplicated: Secondary | ICD-10-CM | POA: Insufficient documentation

## 2014-02-23 DIAGNOSIS — D649 Anemia, unspecified: Secondary | ICD-10-CM

## 2014-02-23 DIAGNOSIS — R5383 Other fatigue: Secondary | ICD-10-CM

## 2014-02-23 DIAGNOSIS — N1330 Unspecified hydronephrosis: Secondary | ICD-10-CM

## 2014-02-23 LAB — CBC WITH DIFFERENTIAL/PLATELET
BASOS ABS: 0 10*3/uL (ref 0.0–0.1)
Basophils Relative: 1 % (ref 0–1)
EOS ABS: 0.6 10*3/uL (ref 0.0–0.7)
EOS PCT: 16 % — AB (ref 0–5)
HEMATOCRIT: 34.3 % — AB (ref 39.0–52.0)
Hemoglobin: 11.3 g/dL — ABNORMAL LOW (ref 13.0–17.0)
LYMPHS ABS: 1 10*3/uL (ref 0.7–4.0)
LYMPHS PCT: 29 % (ref 12–46)
MCH: 27 pg (ref 26.0–34.0)
MCHC: 32.9 g/dL (ref 30.0–36.0)
MCV: 81.9 fL (ref 78.0–100.0)
MONO ABS: 0.3 10*3/uL (ref 0.1–1.0)
Monocytes Relative: 9 % (ref 3–12)
Neutro Abs: 1.6 10*3/uL — ABNORMAL LOW (ref 1.7–7.7)
Neutrophils Relative %: 45 % (ref 43–77)
PLATELETS: 167 10*3/uL (ref 150–400)
RBC: 4.19 MIL/uL — ABNORMAL LOW (ref 4.22–5.81)
RDW: 16.1 % — AB (ref 11.5–15.5)
WBC: 3.6 10*3/uL — AB (ref 4.0–10.5)

## 2014-02-23 LAB — POCT URINALYSIS DIPSTICK
BILIRUBIN UA: NEGATIVE
GLUCOSE UA: NEGATIVE
KETONES UA: NEGATIVE
Leukocytes, UA: NEGATIVE
NITRITE UA: NEGATIVE
PH UA: 5.5
Protein, UA: 30
SPEC GRAV UA: 1.02
Urobilinogen, UA: 0.2

## 2014-02-23 MED ORDER — FERROUS SULFATE 325 (65 FE) MG PO TABS: 325.0000 mg | ORAL_TABLET | Freq: Every day | ORAL | Status: DC

## 2014-02-23 NOTE — Progress Notes (Signed)
Patient ID: Martin Tanner, male   DOB: 01/02/1928, 80 y.o.   MRN: [id number]   CC:  HPI:  80 year old male with a history of urinary retention, status postop urethral resection of prostate one year ago, presents to the clinic to establish care. Patient was hospitalized in 4/14 with urinary retention, renal failure with a creatinine of 1.9, urine tract infection, hyponatremia.  Today he complains of generalized weakness, 2 falls in the last one month. Falls are not preceded by syncope or near-syncopal episodes neither is he dizzy. He simply trips easily. Denies any urinary urgency frequency dysuria He denies any chest pain any shortness of breath. Currently living with his daughter requiring a lot of help with his activities of daily living. He is also anemic with a hemoglobin of around 10.0. Patient really not interested in having a routine colonoscopy but would like his anemia to be evaluated.     No Known Allergies Past Medical History  Diagnosis Date  . BPH (benign prostatic hyperplasia)    Current Outpatient Prescriptions on File Prior to Visit  Medication Sig Dispense Refill  . cephALEXin (KEFLEX) 500 MG capsule Take 1 capsule (500 mg total) by mouth 3 (three) times daily. For 10 days starting today at 2PM  30 capsule  0  . omeprazole (PRILOSEC) 20 MG capsule Take 1 capsule (20 mg total) by mouth daily.  30 capsule  0   No current facility-administered medications on file prior to visit.   Family History  Problem Relation Age of Onset  . Other      Negative CAD  . Other      Negative DM2  . Other      Negative HTN  . Other      Negative Cancer   History   Social History  . Marital Status: Widowed    Spouse Name: N/A    Number of Children: N/A  . Years of Education: N/A   Occupational History  . Not on file.   Social History Main Topics  . Smoking status: Current Every Day Smoker -- 0.25 packs/day for 62 years    Types: Cigarettes  . Smokeless tobacco: Never  Used  . Alcohol Use: No  . Drug Use: No  . Sexual Activity: No   Other Topics Concern  . Not on file   Social History Narrative  . No narrative on file    Review of Systems  Constitutional: As in history of present illness HENT: Negative for ear pain, nosebleeds, congestion, facial swelling, rhinorrhea, neck pain, neck stiffness and ear discharge.   Eyes: Negative for pain, discharge, redness, itching and visual disturbance.  Respiratory: Negative for cough, choking, chest tightness, shortness of breath, wheezing and stridor.   Cardiovascular: Negative for chest pain, palpitations and leg swelling.  Gastrointestinal: Negative for abdominal distention.  Genitourinary: Negative for dysuria, urgency, frequency, hematuria, flank pain, decreased urine volume, difficulty urinating and dyspareunia.  Musculoskeletal: Negative for back pain, joint swelling, arthralgias and gait problem.  Neurological: Negative for dizziness, tremors, seizures, syncope, facial asymmetry, speech difficulty, weakness, light-headedness, numbness and headaches.  Hematological: Negative for adenopathy. Does not bruise/bleed easily.  Psychiatric/Behavioral: Negative for hallucinations, behavioral problems, confusion, dysphoric mood, decreased concentration and agitation.    Objective:   Filed Vitals:   02/23/14 1640  BP: 130/85  Pulse: 82  Resp: 14    Physical Exam  Constitutional: Chronically ill appearing. No distress.  HENT: Normocephalic. External right and left ear normal. Oropharynx is clear and moist.  Eyes: Conjunctivae and EOM are normal. PERRLA, no scleral icterus.  Neck: Normal ROM. Neck supple. No JVD. No tracheal deviation. No thyromegaly.  CVS: RRR, S1/S2 +, no murmurs, no gallops, no carotid bruit.  Pulmonary: Effort and breath sounds normal, no stridor, rhonchi, wheezes, rales.  Abdominal: Soft. BS +,  no distension, tenderness, rebound or guarding.  Musculoskeletal: Normal range of motion.  No edema and no tenderness.  Lymphadenopathy: No lymphadenopathy noted, cervical, inguinal. Neuro: Alert. Normal reflexes, muscle tone coordination. No cranial nerve deficit. Skin: Skin is warm and dry. No rash noted. Not diaphoretic. No erythema. No pallor.  Psychiatric: Normal mood and affect. Behavior, judgment, thought content normal.   Lab Results  Component Value Date   WBC 5.0 04/04/2013   HGB 10.3* 04/04/2013   HCT 30.8* 04/04/2013   MCV 85.1 04/04/2013   PLT 181 04/04/2013   Lab Results  Component Value Date   CREATININE 1.17 04/04/2013   BUN 16 04/04/2013   NA 140 04/04/2013   K 5.1 04/04/2013   CL 110 04/04/2013   CO2 23 04/04/2013    No results found for this basename: HGBA1C   Lipid Panel  No results found for this basename: chol, trig, hdl, cholhdl, vldl, ldlcalc       Assessment and plan:   Patient Active Problem List   Diagnosis Date Noted  . Hyponatremia 04/01/2013  . Pyelonephritis 01/28/2013  . Metabolic acidosis 01/28/2013  . Thrombocytopenia 01/28/2013  . ARF (acute renal failure) 01/25/2013  . UTI (lower urinary tract infection) 01/25/2013  . Urinary retention 01/25/2013  . Anemia 01/25/2013  . Weakness generalized 01/25/2013   Generalized weakness Could be multifactorial Need to rule out underlying infection Workup his anemia Patient also appears to be deconditioned as he has been wheelchair-bound since his discharge in April 2014 Home health nursing ordered [facility] check a urine dipstick   Anemia Not candidate for colonoscopy at his advanced  stage We'll start the patient on ferrous sulfate Check continue PPI, anemia panel Patient has had no significant weight loss according to the daughter was communicating with the help of an interpreter   Moderate bilateral hydronephrosis Currently no urinary symptoms Abdominal pain If symptoms return or creatinine elevated, we'll repeat an ultrasound  Follow up in 2 months   The patient was given  clear instructions to go to ER or return to medical center if symptoms don't improve, worsen or new problems develop. The patient verbalized understanding. The patient was told to call to get any lab results if not heard anything in the next week.

## 2014-02-23 NOTE — Progress Notes (Signed)
Pt is here today requesting help/aid 24/7 around the house to help the pt be able to do his daily activities.

## 2014-02-24 ENCOUNTER — Telehealth: Payer: Self-pay | Admitting: Hematology and Oncology

## 2014-02-24 LAB — ANEMIA PANEL
%SAT: 9 % — ABNORMAL LOW (ref 20–55)
ABS Retic: 29.3 10*3/uL (ref 19.0–186.0)
FERRITIN: 15 ng/mL — AB (ref 22–322)
FOLATE: 5.2 ng/mL
Iron: 28 ug/dL — ABNORMAL LOW (ref 42–165)
RBC.: 4.19 MIL/uL — AB (ref 4.22–5.81)
RETIC CT PCT: 0.7 % (ref 0.4–2.3)
TIBC: 316 ug/dL (ref 215–435)
UIBC: 288 ug/dL (ref 125–400)
VITAMIN B 12: 359 pg/mL (ref 211–911)

## 2014-02-24 LAB — LIPID PANEL
CHOLESTEROL: 177 mg/dL (ref 0–200)
HDL: 40 mg/dL (ref >39)
LDL Cholesterol: 117 mg/dL — ABNORMAL HIGH (ref 0–99)
TRIGLYCERIDES: 100 mg/dL (ref <150)
Total CHOL/HDL Ratio: 4.4 Ratio
VLDL: 20 mg/dL (ref 0–40)

## 2014-02-24 LAB — COMPLETE METABOLIC PANEL WITH GFR
ALT: 19 U/L (ref 0–53)
AST: 27 U/L (ref 0–37)
Albumin: 4.2 g/dL (ref 3.5–5.2)
Alkaline Phosphatase: 97 U/L (ref 39–117)
BILIRUBIN TOTAL: 0.2 mg/dL (ref 0.2–1.2)
BUN: 32 mg/dL — AB (ref 6–23)
CALCIUM: 8.6 mg/dL (ref 8.4–10.5)
CHLORIDE: 107 meq/L (ref 96–112)
CO2: 23 meq/L (ref 19–32)
CREATININE: 1.33 mg/dL (ref 0.50–1.35)
GFR, EST AFRICAN AMERICAN: 56 mL/min — AB
GFR, Est Non African American: 49 mL/min — ABNORMAL LOW
Glucose, Bld: 99 mg/dL (ref 70–99)
Potassium: 4.4 mEq/L (ref 3.5–5.3)
Sodium: 140 mEq/L (ref 135–145)
Total Protein: 7.7 g/dL (ref 6.0–8.3)

## 2014-02-24 LAB — TSH: TSH: 1.012 u[IU]/mL (ref 0.350–4.500)

## 2014-02-24 LAB — VITAMIN D 25 HYDROXY (VIT D DEFICIENCY, FRACTURES): Vit D, 25-Hydroxy: 30 ng/mL (ref 30–89)

## 2014-02-24 NOTE — Telephone Encounter (Signed)
Left pt vm to return call in ref to np appt. °

## 2014-02-25 ENCOUNTER — Telehealth: Payer: Self-pay | Admitting: *Deleted

## 2014-02-25 NOTE — Telephone Encounter (Signed)
Contacted patient with an interpreter. Left a voicemail for patient to return our call. 

## 2014-02-25 NOTE — Telephone Encounter (Signed)
Message copied by Nupur Hohman, [place] R on Thu Feb 25, 2014  3:17 PM ------      Message from: Susie CassetteABROL MD, Germain OsgoodNAYANA      Created: Wed Feb 24, 2014 10:49 AM       Notify patient of the patient is anemic but hemoglobin is actually better than previously, continue taking ferrous sulfate ------

## 2014-02-26 ENCOUNTER — Telehealth: Payer: Self-pay | Admitting: Hematology and Oncology

## 2014-02-26 NOTE — Telephone Encounter (Signed)
LEFT MESSAGE FOR PATIENT TO RETURN CALL TO SCHEDULE NEW PATIENT APPT.  °

## 2014-04-29 ENCOUNTER — Ambulatory Visit: Payer: Medicaid Other | Admitting: Internal Medicine

## 2015-01-24 ENCOUNTER — Ambulatory Visit: Payer: Medicaid Other | Attending: Internal Medicine | Admitting: Internal Medicine

## 2015-01-24 ENCOUNTER — Encounter: Payer: Self-pay | Admitting: Internal Medicine

## 2015-01-24 VITALS — BP 126/86 | HR 87 | Temp 98.3°F | Resp 15 | Wt 104.4 lb

## 2015-01-24 DIAGNOSIS — R531 Weakness: Secondary | ICD-10-CM | POA: Diagnosis not present

## 2015-01-24 DIAGNOSIS — Z23 Encounter for immunization: Secondary | ICD-10-CM

## 2015-01-24 DIAGNOSIS — D509 Iron deficiency anemia, unspecified: Secondary | ICD-10-CM | POA: Insufficient documentation

## 2015-01-24 DIAGNOSIS — Z79899 Other long term (current) drug therapy: Secondary | ICD-10-CM | POA: Diagnosis not present

## 2015-01-24 DIAGNOSIS — Z87430 Personal history of prostatic dysplasia: Secondary | ICD-10-CM | POA: Insufficient documentation

## 2015-01-24 DIAGNOSIS — Z862 Personal history of diseases of the blood and blood-forming organs and certain disorders involving the immune mechanism: Secondary | ICD-10-CM

## 2015-01-24 DIAGNOSIS — F17210 Nicotine dependence, cigarettes, uncomplicated: Secondary | ICD-10-CM | POA: Insufficient documentation

## 2015-01-24 DIAGNOSIS — Z87438 Personal history of other diseases of male genital organs: Secondary | ICD-10-CM

## 2015-01-24 LAB — CBC WITH DIFFERENTIAL/PLATELET
Basophils Absolute: 0 10*3/uL (ref 0.0–0.1)
Basophils Relative: 1 % (ref 0–1)
Eosinophils Absolute: 0.5 10*3/uL (ref 0.0–0.7)
Eosinophils Relative: 11 % — ABNORMAL HIGH (ref 0–5)
HCT: 39 % (ref 39.0–52.0)
HEMOGLOBIN: 13.3 g/dL (ref 13.0–17.0)
LYMPHS ABS: 1.5 10*3/uL (ref 0.7–4.0)
Lymphocytes Relative: 32 % (ref 12–46)
MCH: 29.4 pg (ref 26.0–34.0)
MCHC: 34.1 g/dL (ref 30.0–36.0)
MCV: 86.3 fL (ref 78.0–100.0)
MONO ABS: 0.4 10*3/uL (ref 0.1–1.0)
MONOS PCT: 8 % (ref 3–12)
MPV: 11.1 fL (ref 8.6–12.4)
NEUTROS PCT: 48 % (ref 43–77)
Neutro Abs: 2.2 10*3/uL (ref 1.7–7.7)
PLATELETS: 157 10*3/uL (ref 150–400)
RBC: 4.52 MIL/uL (ref 4.22–5.81)
RDW: 14 % (ref 11.5–15.5)
WBC: 4.6 10*3/uL (ref 4.0–10.5)

## 2015-01-24 MED ORDER — FERROUS SULFATE 325 (65 FE) MG PO TABS: 325.0000 mg | ORAL_TABLET | Freq: Every day | ORAL | Status: DC

## 2015-01-24 NOTE — Progress Notes (Signed)
MRN: [id number] Name: Latoya Toribio HarbourBahadur Fusaro  Sex: male Age: 81 y.o. DOB: 10/24/1928  Allergies: Review of patient's allergies indicates no known allergies.  Chief Complaint  Patient presents with  . Follow-up    HPI: Patient is 81 y.o. male ,  I'm seeing this patient for the first time , apparently Patient has history of iron deficiency anemia, history of UTI/prostatitis, history of BPH, was seen in this office last year , previous blood work reviewed apparently 2 years ago patient had blood work done including PSA level which noted to be elevated at that time patient had a prostate biopsy done which reported BPH, patient does not take iron supplements currently, has some generalized weakness, denies any fever chills chest and shortness of breath.patient also wanted the form to be filled out for personal care services.  Past Medical History  Diagnosis Date  . BPH (benign prostatic hyperplasia)     Past Surgical History  Procedure Laterality Date  . None    . Transurethral resection of prostate N/A 03/16/2013    Procedure: TRANSURETHRAL RESECTION OF THE PROSTATE WITH GYRUS INSTRUMENTS;  Surgeon: Marcine MatarStephen Dahlstedt, MD;  Location: [facility];  Service: Urology;  Laterality: N/A;      Medication List       This list is accurate as of: 01/24/15  5:44 PM.  Always use your most recent med list.               ferrous sulfate 325 (65 FE) MG tablet  Take 1 tablet (325 mg total) by mouth daily with breakfast.     omeprazole 20 MG capsule  Commonly known as:  PRILOSEC  Take 1 capsule (20 mg total) by mouth daily.        Meds ordered this encounter  Medications  . ferrous sulfate 325 (65 FE) MG tablet    Sig: Take 1 tablet (325 mg total) by mouth daily with breakfast.    Dispense:  60 tablet    Refill:  3    Immunization History  Administered Date(s) Administered  . Influenza,inj,Quad PF,36+ Mos 01/24/2015    Family History  Problem Relation Age of Onset  . Other      Negative  CAD  . Other      Negative DM2  . Other      Negative HTN  . Other      Negative Cancer    History  Substance Use Topics  . Smoking status: Current Every Day Smoker -- 0.25 packs/day for 62 years    Types: Cigarettes  . Smokeless tobacco: Never Used  . Alcohol Use: No    Review of Systems   As noted in HPI  Filed Vitals:   01/24/15 1511  BP: 126/86  Pulse: 87  Temp: 98.3 F (36.8 C)  Resp: 15    Physical Exam  Physical Exam  Constitutional: He is oriented to person, place, and time.  Chronically ill-looking patient not in acute distress  Cardiovascular: Normal rate and regular rhythm.   Pulmonary/Chest: Breath sounds normal. No respiratory distress. He has no wheezes. He has no rales.  Abdominal: Soft. He exhibits no distension. There is no tenderness. There is no rebound.  Musculoskeletal: He exhibits no edema.  Neurological: He is alert and oriented to person, place, and time.    CBC    Component Value Date/Time   WBC 3.6* 02/23/2014 1740   RBC 4.19* 02/23/2014 1740   RBC 4.19* 02/23/2014 1740   HGB 11.3* 02/23/2014  1740   HCT 34.3* 02/23/2014 1740   PLT 167 02/23/2014 1740   MCV 81.9 02/23/2014 1740   LYMPHSABS 1.0 02/23/2014 1740   MONOABS 0.3 02/23/2014 1740   EOSABS 0.6 02/23/2014 1740   BASOSABS 0.0 02/23/2014 1740    CMP     Component Value Date/Time   NA 140 02/23/2014 1740   K 4.4 02/23/2014 1740   CL 107 02/23/2014 1740   CO2 23 02/23/2014 1740   GLUCOSE 99 02/23/2014 1740   BUN 32* 02/23/2014 1740   CREATININE 1.33 02/23/2014 1740   CREATININE 1.17 04/04/2013 0408   CALCIUM 8.6 02/23/2014 1740   PROT 7.7 02/23/2014 1740   ALBUMIN 4.2 02/23/2014 1740   AST 27 02/23/2014 1740   ALT 19 02/23/2014 1740   ALKPHOS 97 02/23/2014 1740   BILITOT 0.2 02/23/2014 1740   GFRNONAA 49* 02/23/2014 1740   GFRNONAA 56* 04/04/2013 0408   GFRAA 56* 02/23/2014 1740   GFRAA 65* 04/04/2013 0408    Lab Results  Component Value Date/Time   CHOL  177 02/23/2014 05:40 PM    No components found for: HGA1C  Lab Results  Component Value Date/Time   AST 27 02/23/2014 05:40 PM    Assessment and Plan  History of iron deficiency anemia - Plan:will repeat  CBC with Differential/Platelet,also prescribed her  ferrous sulfate 325 (65 FE) MG tablet  Weakness generalized - Plan: will repeat CBC with Differential/Platelet  History of BPH - Plan:previously noted PSA level elevated, will repeat test PSA   Health Maintenance  -Vaccinations:  Flu shot today  Return in about 4 months (around 05/25/2015).  Doris Cheadle, MD

## 2015-01-24 NOTE — Progress Notes (Signed)
Patient here for follow up on his generalized weakness Patient also needs form for personal care services filled out

## 2015-01-25 LAB — PSA: PSA: 5.47 ng/mL — ABNORMAL HIGH (ref <=4.00)

## 2015-04-29 ENCOUNTER — Emergency Department (HOSPITAL_COMMUNITY): Payer: Medicaid Other

## 2015-04-29 ENCOUNTER — Encounter (HOSPITAL_COMMUNITY): Payer: Self-pay | Admitting: Emergency Medicine

## 2015-04-29 ENCOUNTER — Emergency Department (HOSPITAL_COMMUNITY)
Admission: EM | Admit: 2015-04-29 | Discharge: 2015-04-29 | Disposition: A | Discharge status: Home / Self Care | Payer: Medicaid Other | Attending: Emergency Medicine | Admitting: Emergency Medicine

## 2015-04-29 DIAGNOSIS — R509 Fever, unspecified: Secondary | ICD-10-CM

## 2015-04-29 DIAGNOSIS — J069 Acute upper respiratory infection, unspecified: Secondary | ICD-10-CM | POA: Diagnosis not present

## 2015-04-29 DIAGNOSIS — Z79899 Other long term (current) drug therapy: Secondary | ICD-10-CM | POA: Diagnosis not present

## 2015-04-29 DIAGNOSIS — R059 Cough, unspecified: Secondary | ICD-10-CM

## 2015-04-29 DIAGNOSIS — Z87438 Personal history of other diseases of male genital organs: Secondary | ICD-10-CM | POA: Insufficient documentation

## 2015-04-29 DIAGNOSIS — R630 Anorexia: Secondary | ICD-10-CM | POA: Insufficient documentation

## 2015-04-29 DIAGNOSIS — R05 Cough: Secondary | ICD-10-CM

## 2015-04-29 DIAGNOSIS — Z720 Tobacco use: Secondary | ICD-10-CM | POA: Diagnosis not present

## 2015-04-29 DIAGNOSIS — R079 Chest pain, unspecified: Secondary | ICD-10-CM | POA: Diagnosis not present

## 2015-04-29 DIAGNOSIS — R0989 Other specified symptoms and signs involving the circulatory and respiratory systems: Secondary | ICD-10-CM

## 2015-04-29 LAB — COMPREHENSIVE METABOLIC PANEL
ALT: 14 U/L — AB (ref 17–63)
AST: 22 U/L (ref 15–41)
Albumin: 3.8 g/dL (ref 3.5–5.0)
Alkaline Phosphatase: 92 U/L (ref 38–126)
Anion gap: 8 (ref 5–15)
BILIRUBIN TOTAL: 1 mg/dL (ref 0.3–1.2)
BUN: 11 mg/dL (ref 6–20)
CALCIUM: 9.2 mg/dL (ref 8.9–10.3)
CHLORIDE: 105 mmol/L (ref 101–111)
CO2: 25 mmol/L (ref 22–32)
CREATININE: 1.43 mg/dL — AB (ref 0.61–1.24)
GFR, EST AFRICAN AMERICAN: 50 mL/min — AB (ref >60)
GFR, EST NON AFRICAN AMERICAN: 43 mL/min — AB (ref >60)
GLUCOSE: 100 mg/dL — AB (ref 70–99)
POTASSIUM: 4.9 mmol/L (ref 3.5–5.1)
SODIUM: 138 mmol/L (ref 135–145)
Total Protein: 7.8 g/dL (ref 6.5–8.1)

## 2015-04-29 LAB — CBC WITH DIFFERENTIAL/PLATELET
BASOS PCT: 1 % (ref 0–1)
Basophils Absolute: 0 10*3/uL (ref 0.0–0.1)
EOS ABS: 0.5 10*3/uL (ref 0.0–0.7)
Eosinophils Relative: 9 % — ABNORMAL HIGH (ref 0–5)
HEMATOCRIT: 40.1 % (ref 39.0–52.0)
HEMOGLOBIN: 13.3 g/dL (ref 13.0–17.0)
LYMPHS ABS: 1 10*3/uL (ref 0.7–4.0)
Lymphocytes Relative: 18 % (ref 12–46)
MCH: 29.7 pg (ref 26.0–34.0)
MCHC: 33.2 g/dL (ref 30.0–36.0)
MCV: 89.5 fL (ref 78.0–100.0)
MONO ABS: 0.4 10*3/uL (ref 0.1–1.0)
MONOS PCT: 7 % (ref 3–12)
Neutro Abs: 3.8 10*3/uL (ref 1.7–7.7)
Neutrophils Relative %: 66 % (ref 43–77)
Platelets: 122 10*3/uL — ABNORMAL LOW (ref 150–400)
RBC: 4.48 MIL/uL (ref 4.22–5.81)
RDW: 13.8 % (ref 11.5–15.5)
WBC: 5.7 10*3/uL (ref 4.0–10.5)

## 2015-04-29 LAB — TROPONIN I

## 2015-04-29 MED ORDER — BENZONATATE 100 MG PO CAPS: 100.0000 mg | ORAL_CAPSULE | Freq: Two times a day (BID) | ORAL | Status: DC | PRN

## 2015-04-29 NOTE — ED Notes (Signed)
pts family reports pt has had a productive cough x1 week, chest pain x 3 months that is off and on, none at this time. Pts family reports pt has had fever as high as 100.8 this am, runny nose, sore throat. Pt appears to be in no acute distress, resp e/u.

## 2015-04-29 NOTE — ED Notes (Signed)
Pt. reports persistent cough with fever , chest congestion , SOB , generalized weakness and poor appetite for 2 months .

## 2015-04-29 NOTE — ED Provider Notes (Signed)
CSN: [id number]     Arrival date & time 04/29/15  0109 History   This chart was scribed for Martin Tanner Garrett Bowring, MD by Freida Busmaniana Omoyeni, ED Scribe. This patient was seen in room B16C/B16C and the patient's care was started 1:25 AM.    Chief Complaint  Patient presents with  . Cough  . Fever    The history is provided by the patient and a relative. No language interpreter was used.     HPI Comments:  Martin Tanner is a 81 y.o. male who presents to the Emergency Department complaining of cough for the past 1 week. He has been experiencing the daily CP for about 3 months, none since yesterday. He denies CP at this time. Pt also reports associated dry cough for about 1 week, fever this am with max temp of 100.8, rhinorrhea and sore throat. Pt's son reports decreased appetite/PO intake for one year. No alleviating factors noted.   Past Medical History  Diagnosis Date  . BPH (benign prostatic hyperplasia)    Past Surgical History  Procedure Laterality Date  . None    . Transurethral resection of prostate N/A 03/16/2013    Procedure: TRANSURETHRAL RESECTION OF THE PROSTATE WITH GYRUS INSTRUMENTS;  Surgeon: Marcine MatarStephen Dahlstedt, MD;  Location: [facility];  Service: Urology;  Laterality: N/A;   Family History  Problem Relation Age of Onset  . Other      Negative CAD  . Other      Negative DM2  . Other      Negative HTN  . Other      Negative Cancer   History  Substance Use Topics  . Smoking status: Current Every Day Smoker -- 0.25 packs/day for 62 years    Types: Cigarettes  . Smokeless tobacco: Never Used  . Alcohol Use: No    Review of Systems  Constitutional: Positive for fever and appetite change.  HENT: Positive for rhinorrhea and sore throat.   Respiratory: Positive for cough. Negative for shortness of breath.   Cardiovascular: Positive for chest pain.  Gastrointestinal: Negative for vomiting and abdominal pain.  All other systems reviewed and are negative.     Allergies   Review of patient's allergies indicates no known allergies.  Home Medications   Prior to Admission medications   Medication Sig Start Date End Date Taking? Authorizing Provider  ferrous sulfate 325 (65 FE) MG tablet Take 1 tablet (325 mg total) by mouth daily with breakfast. 01/24/15   Doris Cheadleeepak Advani, MD  omeprazole (PRILOSEC) 20 MG capsule Take 1 capsule (20 mg total) by mouth daily. 04/04/13   Osvaldo ShipperGokul Krishnan, MD   BP 151/89 mmHg  Pulse 63  Temp(Src) 97.9 F (36.6 C) (Oral)  Resp 20  SpO2 100% Physical Exam  Constitutional: He is oriented to person, place, and time. He appears well-developed and well-nourished. No distress.  HENT:  Head: Normocephalic and atraumatic.  Right Ear: External ear normal.  Left Ear: External ear normal.  Nose: Nose normal.  Eyes: Right eye exhibits no discharge. Left eye exhibits no discharge.  Neck: Neck supple.  Cardiovascular: Normal rate, regular rhythm, normal heart sounds and intact distal pulses.   Pulmonary/Chest: Effort normal and breath sounds normal. He exhibits no tenderness.  Abdominal: Soft. There is no tenderness.  Musculoskeletal: He exhibits no edema.  Neurological: He is alert and oriented to person, place, and time.  Skin: Skin is warm and dry. He is not diaphoretic.  Nursing note and vitals reviewed.   ED Course  Procedures   DIAGNOSTIC STUDIES:  Oxygen Saturation is 100% on RA, normal by my interpretation.    COORDINATION OF CARE:  1:31 AM Discussed treatment plan with pt and family at bedside and they agreed to plan.  Labs Review Labs Reviewed  CBC WITH DIFFERENTIAL/PLATELET - Abnormal; Notable for the following:    Platelets 122 (*)    Eosinophils Relative 9 (*)    All other components within normal limits  COMPREHENSIVE METABOLIC PANEL - Abnormal; Notable for the following:    Glucose, Bld 100 (*)    Creatinine, Ser 1.43 (*)    ALT 14 (*)    GFR calc non Af Amer 43 (*)    GFR calc Af Amer 50 (*)    All other  components within normal limits  TROPONIN I    Imaging Review Dg Chest 2 View  04/29/2015   CLINICAL DATA:  Cough and fever for 3 days  EXAM: CHEST  2 VIEW  COMPARISON:  03/31/2013  FINDINGS: There is shallow inspiration. There is mild crowding of the markings in the bases, probably attributable to the shallow inspiration. The lungs are otherwise clear. The pulmonary vasculature is normal. There are no pleural effusions. Hilar, mediastinal and cardiac contours are unremarkable and unchanged.  IMPRESSION: Shallow inspiration with associated crowding of the basilar markings.   Electronically Signed   By: Ellery Plunkaniel R Mitchell M.D.   On: 04/29/2015 02:03     EKG Interpretation   Date/Time:  Friday Apr 29 2015 01:48:59 EDT Ventricular Rate:  63 PR Interval:  178 QRS Duration: 103 QT Interval:  377 QTC Calculation: 386 R Axis:   66 Text Interpretation:  Sinus rhythm no acute ischemia No old tracing to  compare Confirmed by Johnanna Bakke  MD, Skyleigh Windle ([id number]) on 04/29/2015 2:18:29 AM      MDM   Final diagnoses:  Upper respiratory infection    Patient appears to have an upper respiratory infection. No increased work of breathing, hypoxia, or abnormal lung sounds. Chest x-ray unremarkable. Patient will be treated for his cough and recommend follow-up with PCP. Patient does have chest pain but this seems to be an ongoing problem for months and thus I have very low suspicion for ACS, pulmonary embolism, or dissection. EKG and troponin are unremarkable. Stable for discharge to follow-up.  I personally performed the services described in this documentation, which was scribed in my presence. The recorded information has been reviewed and is accurate.    Martin Tanner Abubakar Crispo, MD 04/29/15 0530

## 2015-12-09 ENCOUNTER — Encounter (HOSPITAL_COMMUNITY): Payer: Self-pay

## 2015-12-09 ENCOUNTER — Emergency Department (HOSPITAL_COMMUNITY): Payer: Medicaid Other

## 2015-12-09 ENCOUNTER — Emergency Department (HOSPITAL_COMMUNITY)
Admission: EM | Admit: 2015-12-09 | Discharge: 2015-12-10 | Disposition: A | Discharge status: Home / Self Care | Payer: Medicaid Other | Attending: Emergency Medicine | Admitting: Emergency Medicine

## 2015-12-09 DIAGNOSIS — R269 Unspecified abnormalities of gait and mobility: Secondary | ICD-10-CM | POA: Insufficient documentation

## 2015-12-09 DIAGNOSIS — M109 Gout, unspecified: Secondary | ICD-10-CM

## 2015-12-09 DIAGNOSIS — M25475 Effusion, left foot: Secondary | ICD-10-CM

## 2015-12-09 DIAGNOSIS — M79672 Pain in left foot: Secondary | ICD-10-CM | POA: Diagnosis present

## 2015-12-09 DIAGNOSIS — F17210 Nicotine dependence, cigarettes, uncomplicated: Secondary | ICD-10-CM | POA: Diagnosis not present

## 2015-12-09 DIAGNOSIS — M1000 Idiopathic gout, unspecified site: Secondary | ICD-10-CM | POA: Insufficient documentation

## 2015-12-09 DIAGNOSIS — Z87438 Personal history of other diseases of male genital organs: Secondary | ICD-10-CM | POA: Insufficient documentation

## 2015-12-09 DIAGNOSIS — L03116 Cellulitis of left lower limb: Secondary | ICD-10-CM

## 2015-12-09 LAB — CBC WITH DIFFERENTIAL/PLATELET
BASOS ABS: 0.1 10*3/uL (ref 0.0–0.1)
Basophils Relative: 1 %
EOS ABS: 0.1 10*3/uL (ref 0.0–0.7)
Eosinophils Relative: 1 %
HEMATOCRIT: 36.8 % — AB (ref 39.0–52.0)
HEMOGLOBIN: 12.6 g/dL — AB (ref 13.0–17.0)
LYMPHS PCT: 16 %
Lymphs Abs: 1.1 10*3/uL (ref 0.7–4.0)
MCH: 30 pg (ref 26.0–34.0)
MCHC: 34.2 g/dL (ref 30.0–36.0)
MCV: 87.6 fL (ref 78.0–100.0)
MONOS PCT: 9 %
Monocytes Absolute: 0.6 10*3/uL (ref 0.1–1.0)
NEUTROS ABS: 4.8 10*3/uL (ref 1.7–7.7)
Neutrophils Relative %: 73 %
Platelets: 114 10*3/uL — ABNORMAL LOW (ref 150–400)
RBC: 4.2 MIL/uL — AB (ref 4.22–5.81)
RDW: 13 % (ref 11.5–15.5)
WBC: 6.7 10*3/uL (ref 4.0–10.5)

## 2015-12-09 LAB — COMPREHENSIVE METABOLIC PANEL
ALBUMIN: 3.8 g/dL (ref 3.5–5.0)
ALK PHOS: 75 U/L (ref 38–126)
ALT: 12 U/L — ABNORMAL LOW (ref 17–63)
AST: 20 U/L (ref 15–41)
Anion gap: 9 (ref 5–15)
BUN: 21 mg/dL — AB (ref 6–20)
CHLORIDE: 106 mmol/L (ref 101–111)
CO2: 23 mmol/L (ref 22–32)
CREATININE: 1.52 mg/dL — AB (ref 0.61–1.24)
Calcium: 9.2 mg/dL (ref 8.9–10.3)
GFR calc Af Amer: 46 mL/min — ABNORMAL LOW (ref >60)
GFR calc non Af Amer: 40 mL/min — ABNORMAL LOW (ref >60)
Glucose, Bld: 109 mg/dL — ABNORMAL HIGH (ref 65–99)
Potassium: 4 mmol/L (ref 3.5–5.1)
SODIUM: 138 mmol/L (ref 135–145)
Total Bilirubin: 1.4 mg/dL — ABNORMAL HIGH (ref 0.3–1.2)
Total Protein: 7.9 g/dL (ref 6.5–8.1)

## 2015-12-09 LAB — I-STAT CG4 LACTIC ACID, ED: LACTIC ACID, VENOUS: 0.77 mmol/L (ref 0.5–2.0)

## 2015-12-09 MED ORDER — MORPHINE SULFATE (PF) 4 MG/ML IV SOLN
4.0000 mg | Freq: Once | INTRAVENOUS | Status: AC
  Administered 2015-12-09: 4 mg via INTRAVENOUS
  Filled 2015-12-09: qty 1

## 2015-12-09 MED ORDER — CLINDAMYCIN PHOSPHATE 600 MG/50ML IV SOLN
600.0000 mg | Freq: Once | INTRAVENOUS | Status: AC
  Administered 2015-12-09: 600 mg via INTRAVENOUS
  Filled 2015-12-09: qty 50

## 2015-12-09 MED ORDER — SODIUM CHLORIDE 0.9 % IV BOLUS (SEPSIS)
1000.0000 mL | Freq: Once | INTRAVENOUS | Status: AC
  Administered 2015-12-09: 1000 mL via INTRAVENOUS

## 2015-12-09 NOTE — ED Notes (Signed)
Patient transported to X-ray 

## 2015-12-09 NOTE — ED Provider Notes (Signed)
CSN: [id number]     Arrival date & time 12/09/15  1818 History   First MD Initiated Contact with Patient 12/09/15 1911     Chief Complaint  Patient presents with  . Foot Pain  . Foot Swelling     (Consider location/radiation/quality/duration/timing/severity/associated sxs/prior Treatment) Patient is a 81 y.o. male presenting with lower extremity pain.  Foot Pain This is a new problem. Episode onset: 3 days. The problem occurs constantly. The problem has been gradually worsening. Pertinent negatives include no chest pain, no abdominal pain, no headaches and no shortness of breath. The symptoms are aggravated by walking. Nothing relieves the symptoms. He has tried nothing for the symptoms. The treatment provided no relief.    Past Medical History  Diagnosis Date  . BPH (benign prostatic hyperplasia)    Past Surgical History  Procedure Laterality Date  . None    . Transurethral resection of prostate N/A 03/16/2013    Procedure: TRANSURETHRAL RESECTION OF THE PROSTATE WITH GYRUS INSTRUMENTS;  Surgeon: Marcine MatarStephen Dahlstedt, MD;  Location: [facility];  Service: Urology;  Laterality: N/A;   Family History  Problem Relation Age of Onset  . Other      Negative CAD  . Other      Negative DM2  . Other      Negative HTN  . Other      Negative Cancer   Social History  Substance Use Topics  . Smoking status: Current Every Day Smoker -- 0.25 packs/day for 62 years    Types: Cigarettes  . Smokeless tobacco: Never Used  . Alcohol Use: No    Review of Systems  Constitutional: Negative for fever.  HENT: Negative for sore throat.   Eyes: Negative for visual disturbance.  Respiratory: Negative for shortness of breath.   Cardiovascular: Negative for chest pain.  Gastrointestinal: Negative for nausea, vomiting and abdominal pain.  Genitourinary: Negative for difficulty urinating.  Musculoskeletal: Positive for joint swelling, arthralgias and gait problem. Negative for back pain (\) and neck  stiffness.  Skin: Negative for rash.  Neurological: Negative for syncope and headaches.      Allergies  Review of patient's allergies indicates no known allergies.  Home Medications   Prior to Admission medications   Medication Sig Start Date End Date Taking? Authorizing Provider  benzonatate (TESSALON) 100 MG capsule Take 1 capsule (100 mg total) by mouth 2 (two) times daily as needed for cough. Patient not taking: Reported on 12/09/2015 04/29/15   Pricilla LovelessScott Goldston, MD  clindamycin (CLEOCIN) 150 MG capsule Take 2 capsules (300 mg total) by mouth 3 (three) times daily. 12/10/15   Alvira MondayErin Kerrigan Glendening, MD  ferrous sulfate 325 (65 FE) MG tablet Take 1 tablet (325 mg total) by mouth daily with breakfast. Patient not taking: Reported on 04/29/2015 01/24/15   Doris Cheadleeepak Advani, MD  HYDROcodone-acetaminophen (NORCO/VICODIN) 5-325 MG tablet Take 1 tablet by mouth every 4 (four) hours as needed. 12/10/15   Alvira MondayErin Braelynne Garinger, MD  omeprazole (PRILOSEC) 20 MG capsule Take 1 capsule (20 mg total) by mouth daily. Patient not taking: Reported on 04/29/2015 04/04/13   Osvaldo ShipperGokul Krishnan, MD   BP 106/73 mmHg  Pulse 77  Temp(Src) 99.6 F (37.6 C) (Oral)  Resp 22  SpO2 94% Physical Exam  Constitutional: He is oriented to person, place, and time. He appears well-developed and well-nourished. No distress.  HENT:  Head: Normocephalic and atraumatic.  Eyes: Conjunctivae and EOM are normal.  Neck: Normal range of motion.  Cardiovascular: Normal rate, regular rhythm, normal heart  sounds and intact distal pulses.  Exam reveals no gallop and no friction rub.   No murmur heard. Pulmonary/Chest: Effort normal and breath sounds normal. No respiratory distress. He has no wheezes. He has no rales.  Abdominal: Soft. He exhibits no distension. There is no tenderness. There is no guarding.  Musculoskeletal: He exhibits no edema.       Left knee: He exhibits normal range of motion.       Left ankle: He exhibits normal range of motion  and no swelling.       Left foot: There is decreased range of motion (of toes secondary to swelling), tenderness (worse over fifth toe MTP, pain wiht passive ROM of toes), bony tenderness and swelling (swelling to dorsum of foot and all toes, toes wiht less natural flexion in setting of swelling). There is normal capillary refill, no crepitus, no deformity and no laceration.  Neurological: He is alert and oriented to person, place, and time.  Skin: Skin is warm and dry. He is not diaphoretic. There is erythema (dorsum of left foot and toes).  Nursing note and vitals reviewed.   ED Course  Procedures (including critical care time) Labs Review Labs Reviewed  CBC WITH DIFFERENTIAL/PLATELET - Abnormal; Notable for the following:    RBC 4.20 (*)    Hemoglobin 12.6 (*)    HCT 36.8 (*)    Platelets 114 (*)    All other components within normal limits  COMPREHENSIVE METABOLIC PANEL - Abnormal; Notable for the following:    Glucose, Bld 109 (*)    BUN 21 (*)    Creatinine, Ser 1.52 (*)    ALT 12 (*)    Total Bilirubin 1.4 (*)    GFR calc non Af Amer 40 (*)    GFR calc Af Amer 46 (*)    All other components within normal limits  URIC ACID - Abnormal; Notable for the following:    Uric Acid, Serum 7.8 (*)    All other components within normal limits  CULTURE, BLOOD (ROUTINE X 2)  CULTURE, BLOOD (ROUTINE X 2)  I-STAT CG4 LACTIC ACID, ED  I-STAT CG4 LACTIC ACID, ED    Imaging Review Dg Foot Complete Left  12/09/2015  CLINICAL DATA:  Pt presents with left foot pain, swelling and warmth x 1 day. No injury or wound present. EXAM: LEFT FOOT - COMPLETE 3+ VIEW COMPARISON:  None. FINDINGS: No acute fracture or dislocation. Mild soft tissue swelling. No radiopaque foreign object. No osseous destruction. IMPRESSION: No acute osseous abnormality.  Diffuse soft tissue swelling. Electronically Signed   By: Jeronimo Greaves M.D.   On: 12/09/2015 19:50   I have personally reviewed and evaluated these  images and lab results as part of my medical decision-making.   EKG Interpretation None      MDM   Final diagnoses:  Swelling of foot joint, left  Acute gout of left foot, unspecified cause  Cellulitis of left lower extremity, possible   81yo male with no significant medical history presents with concern of left foot pain.  Patient with full range of motion of joint and have low suspicion for septic arthritis.  No history of trauma to suggest fracture or acute ligamentous injury.  XR of of foot shows no radiographic evidence for osteomyelitis and have low suspicion for this by history.  Equal pulses bilaterally and doubt arterial thrombus as cause of pain.  No pain in lower leg or swelling and doubt DVT.  Concern for left lower  extremity cellulitis and labs and clindamycin were ordered.  Labs show no leukocytosis, no lactic acidosis.  Called Orthopedics, Dr. Despina Hick given concern for left foot and toe swelling with ?of tenosynovitis and he recommended that in absence of fever/leukocytosis, to check uric acid level for possibility of gout, or consideration for admission/MRI in morning if normal uric acid or high clinical suspicion for infxn.  Uric acid returned elevated at 7.8, suggesting possible gout as etiology of swelling and pain, however given age as risk factor and question of cellulitis will continue to treat with antibiotics. Given 1 time dose of colchicine and decadron.  Pt with kidney disease and will not order other antiinflammatory medications.  Doubt severe foot infection/tenosynovitis/abscess in setting of no leukocytosis or fever. Patient given rx for possible cellulitis clindamycin TID for 1 week and norco for pain.   Patient discharged in stable condition with understanding of reasons to return as discussed in detail through [place] interpreter.   Alvira Monday, MD 12/10/15 [phone]

## 2015-12-09 NOTE — ED Notes (Signed)
NURSE WILL BE OBTAINING 2ND BLOOD CULTURE AND I-STAT WITH START OF IV

## 2015-12-09 NOTE — ED Notes (Signed)
LEFT FOOT PAIN FOR 3-4 DAYS. NO INJURY,

## 2015-12-09 NOTE — ED Notes (Signed)
Pt presents with left foot pain, swelling and warmth x 1 day. No injury or wound present.

## 2015-12-09 NOTE — ED Notes (Addendum)
INTERPRETER [phone]

## 2015-12-10 LAB — URIC ACID: URIC ACID, SERUM: 7.8 mg/dL — AB (ref 4.4–7.6)

## 2015-12-10 MED ORDER — HYDROCODONE-ACETAMINOPHEN 5-325 MG PO TABS
1.0000 | ORAL_TABLET | Freq: Once | ORAL | Status: AC
  Administered 2015-12-10: 1 via ORAL
  Filled 2015-12-10: qty 1

## 2015-12-10 MED ORDER — COLCHICINE 0.6 MG PO TABS
1.2000 mg | ORAL_TABLET | Freq: Once | ORAL | Status: AC
Start: 2015-12-10 — End: 2015-12-10
  Administered 2015-12-10: 1.2 mg via ORAL
  Filled 2015-12-10: qty 2

## 2015-12-10 MED ORDER — DEXAMETHASONE 2 MG PO TABS
10.0000 mg | ORAL_TABLET | Freq: Once | ORAL | Status: AC
  Administered 2015-12-10: 10 mg via ORAL
  Filled 2015-12-10: qty 2

## 2015-12-10 MED ORDER — CLINDAMYCIN HCL 150 MG PO CAPS: 300.0000 mg | ORAL_CAPSULE | Freq: Three times a day (TID) | ORAL | Status: DC

## 2015-12-10 MED ORDER — HYDROCODONE-ACETAMINOPHEN 5-325 MG PO TABS: 1.0000 | ORAL_TABLET | ORAL | Status: DC | PRN

## 2015-12-10 NOTE — Discharge Instructions (Signed)
Gout °Gout is an inflammatory arthritis caused by a buildup of uric acid crystals in the joints. Uric acid is a chemical that is normally present in the blood. When the level of uric acid in the blood is too high it can form crystals that deposit in your joints and tissues. This causes joint redness, soreness, and swelling (inflammation). Repeat attacks are common. Over time, uric acid crystals can form into masses (tophi) near a joint, destroying bone and causing disfigurement. Gout is treatable and often preventable. °CAUSES  °The disease begins with elevated levels of uric acid in the blood. Uric acid is produced by your body when it breaks down a naturally found substance called purines. Certain foods you eat, such as meats and fish, contain high amounts of purines. Causes of an elevated uric acid level include: °· Being passed down from parent to child (heredity). °· Diseases that cause increased uric acid production (such as obesity, psoriasis, and certain cancers). °· Excessive alcohol use. °· Diet, especially diets rich in meat and seafood. °· Medicines, including certain cancer-fighting medicines (chemotherapy), water pills (diuretics), and aspirin. °· Chronic kidney disease. The kidneys are no longer able to remove uric acid well. °· Problems with metabolism. °Conditions strongly associated with gout include: °· Obesity. °· High blood pressure. °· High cholesterol. °· Diabetes. °Not everyone with elevated uric acid levels gets gout. It is not understood why some people get gout and others do not. Surgery, joint injury, and eating too much of certain foods are some of the factors that can lead to gout attacks. °SYMPTOMS  °· An attack of gout comes on quickly. It causes intense pain with redness, swelling, and warmth in a joint. °· Fever can occur. °· Often, only one joint is involved. Certain joints are more commonly involved: °· Base of the big toe. °· Knee. °· Ankle. °· Wrist. °· Finger. °Without  treatment, an attack usually goes away in a few days to weeks. Between attacks, you usually will not have symptoms, which is different from many other forms of arthritis. °DIAGNOSIS  °Your caregiver will suspect gout based on your symptoms and exam. In some cases, tests may be recommended. The tests may include: °· Blood tests. °· Urine tests. °· X-rays. °· Joint fluid exam. This exam requires a needle to remove fluid from the joint (arthrocentesis). Using a microscope, gout is confirmed when uric acid crystals are seen in the joint fluid. °TREATMENT  °There are two phases to gout treatment: treating the sudden onset (acute) attack and preventing attacks (prophylaxis). °· Treatment of an Acute Attack. °¨ Medicines are used. These include anti-inflammatory medicines or steroid medicines. °¨ An injection of steroid medicine into the affected joint is sometimes necessary. °¨ The painful joint is rested. Movement can worsen the arthritis. °¨ You may use warm or cold treatments on painful joints, depending which works best for you. °· Treatment to Prevent Attacks. °¨ If you suffer from frequent gout attacks, your caregiver may advise preventive medicine. These medicines are started after the acute attack subsides. These medicines either help your kidneys eliminate uric acid from your body or decrease your uric acid production. You may need to stay on these medicines for a very long time. °¨ The early phase of treatment with preventive medicine can be associated with an increase in acute gout attacks. For this reason, during the first few months of treatment, your caregiver may also advise you to take medicines usually used for acute gout treatment. Be sure you   understand your caregiver's directions. Your caregiver may make several adjustments to your medicine dose before these medicines are effective. °¨ Discuss dietary treatment with your caregiver or dietitian. Alcohol and drinks high in sugar and fructose and foods  such as meat, poultry, and seafood can increase uric acid levels. Your caregiver or dietitian can advise you on drinks and foods that should be limited. °HOME CARE INSTRUCTIONS  °· Do not take aspirin to relieve pain. This raises uric acid levels. °· Only take over-the-counter or prescription medicines for pain, discomfort, or fever as directed by your caregiver. °· Rest the joint as much as possible. When in bed, keep sheets and blankets off painful areas. °· Keep the affected joint raised (elevated). °· Apply warm or cold treatments to painful joints. Use of warm or cold treatments depends on which works best for you. °· Use crutches if the painful joint is in your leg. °· Drink enough fluids to keep your urine clear or pale yellow. This helps your body get rid of uric acid. Limit alcohol, sugary drinks, and fructose drinks. °· Follow your dietary instructions. Pay careful attention to the amount of protein you eat. Your daily diet should emphasize fruits, vegetables, whole grains, and fat-free or low-fat milk products. Discuss the use of coffee, vitamin C, and cherries with your caregiver or dietitian. These may be helpful in lowering uric acid levels. °· Maintain a healthy body weight. °SEEK MEDICAL CARE IF:  °· You develop diarrhea, vomiting, or any side effects from medicines. °· You do not feel better in 24 hours, or you are getting worse. °SEEK IMMEDIATE MEDICAL CARE IF:  °· Your joint becomes suddenly more tender, and you have chills or a fever. °MAKE SURE YOU:  °· Understand these instructions. °· Will watch your condition. °· Will get help right away if you are not doing well or get worse. °  °This information is not intended to replace advice given to you by your health care provider. Make sure you discuss any questions you have with your health care provider. °  °Document Released: 12/07/2000 Document Revised: 12/31/2014 Document Reviewed: 07/23/2012 °Elsevier Interactive Patient Education ©2016  [employer] ° °Cellulitis °Cellulitis is an infection of the skin and the tissue beneath it. The infected area is usually red and tender. Cellulitis occurs most often in the arms and lower legs.  °CAUSES  °Cellulitis is caused by bacteria that enter the skin through cracks or cuts in the skin. The most common types of bacteria that cause cellulitis are staphylococci and streptococci. °SIGNS AND SYMPTOMS  °· Redness and warmth. °· Swelling. °· Tenderness or pain. °· Fever. °DIAGNOSIS  °Your health care provider can usually determine what is wrong based on a physical exam. Blood tests may also be done. °TREATMENT  °Treatment usually involves taking an antibiotic medicine. °HOME CARE INSTRUCTIONS  °· Take your antibiotic medicine as directed by your health care provider. Finish the antibiotic even if you start to feel better. °· Keep the infected arm or leg elevated to reduce swelling. °· Apply a warm cloth to the affected area up to 4 times per day to relieve pain. °· Take medicines only as directed by your health care provider. °· Keep all follow-up visits as directed by your health care provider. °SEEK MEDICAL CARE IF:  °· You notice red streaks coming from the infected area. °· Your red area gets larger or turns dark in color. °· Your bone or joint underneath the infected area becomes painful after   the skin has healed. °· Your infection returns in the same area or another area. °· You notice a swollen bump in the infected area. °· You develop new symptoms. °· You have a fever. °SEEK IMMEDIATE MEDICAL CARE IF:  °· You feel very sleepy. °· You develop vomiting or diarrhea. °· You have a general ill feeling (malaise) with muscle aches and pains. °  °This information is not intended to replace advice given to you by your health care provider. Make sure you discuss any questions you have with your health care provider. °  °Document Released: 09/19/2005 Document Revised: 08/31/2015 Document Reviewed:  02/25/2012 °Elsevier Interactive Patient Education ©2016 [employer] Inc. ° °

## 2015-12-15 LAB — CULTURE, BLOOD (ROUTINE X 2)
CULTURE: NO GROWTH
CULTURE: NO GROWTH

## 2016-01-05 ENCOUNTER — Encounter (HOSPITAL_COMMUNITY): Payer: Self-pay | Admitting: Emergency Medicine

## 2016-01-05 ENCOUNTER — Emergency Department (HOSPITAL_COMMUNITY)
Admission: EM | Admit: 2016-01-05 | Discharge: 2016-01-06 | Disposition: A | Discharge status: Home / Self Care | Payer: Medicaid Other | Attending: Emergency Medicine | Admitting: Emergency Medicine

## 2016-01-05 DIAGNOSIS — F17210 Nicotine dependence, cigarettes, uncomplicated: Secondary | ICD-10-CM | POA: Diagnosis not present

## 2016-01-05 DIAGNOSIS — R2243 Localized swelling, mass and lump, lower limb, bilateral: Secondary | ICD-10-CM | POA: Diagnosis present

## 2016-01-05 DIAGNOSIS — Z87430 Personal history of prostatic dysplasia: Secondary | ICD-10-CM | POA: Insufficient documentation

## 2016-01-05 DIAGNOSIS — L03115 Cellulitis of right lower limb: Secondary | ICD-10-CM

## 2016-01-05 DIAGNOSIS — M79672 Pain in left foot: Secondary | ICD-10-CM

## 2016-01-05 DIAGNOSIS — M79671 Pain in right foot: Secondary | ICD-10-CM

## 2016-01-05 LAB — CBC WITH DIFFERENTIAL/PLATELET
BASOS ABS: 0 10*3/uL (ref 0.0–0.1)
Basophils Relative: 0 %
EOS ABS: 0.1 10*3/uL (ref 0.0–0.7)
EOS PCT: 1 %
HCT: 32.5 % — ABNORMAL LOW (ref 39.0–52.0)
Hemoglobin: 11.2 g/dL — ABNORMAL LOW (ref 13.0–17.0)
LYMPHS ABS: 0.9 10*3/uL (ref 0.7–4.0)
Lymphocytes Relative: 17 %
MCH: 30.3 pg (ref 26.0–34.0)
MCHC: 34.5 g/dL (ref 30.0–36.0)
MCV: 87.8 fL (ref 78.0–100.0)
MONO ABS: 0.6 10*3/uL (ref 0.1–1.0)
Monocytes Relative: 11 %
Neutro Abs: 3.9 10*3/uL (ref 1.7–7.7)
Neutrophils Relative %: 71 %
PLATELETS: 154 10*3/uL (ref 150–400)
RBC: 3.7 MIL/uL — AB (ref 4.22–5.81)
RDW: 13 % (ref 11.5–15.5)
WBC: 5.5 10*3/uL (ref 4.0–10.5)

## 2016-01-05 NOTE — ED Notes (Addendum)
PER [facility]: Patient to ED from home c/o generalized weakness and fever of 100.63F starting yesterday. Per family, patient usually ambulatory, but unable to get out of bed today d/t "feeling weak and tired." Patient also has had no appetite for 2 weeks and has not been eating. Pedal edema noted bilaterally, unknown how long. EMS VS: 130/62, HR 106, RR 18. Per daughter, no speech changes or confusion.

## 2016-01-05 NOTE — ED Provider Notes (Signed)
CSN: [id number]     Arrival date & time 01/05/16  2203 History  By signing my name below, I, Arianna Nassar, attest that this documentation has been prepared under the direction and in the presence of Shon Baton, MD. Electronically Signed: Octavia Heir, ED Scribe. 01/05/2016. 11:19 PM.    Chief Complaint  Patient presents with  . Weakness  . Leg Swelling      The history is provided by a relative. A language interpreter was used.   HPI Comments: Martin Tanner is a 82 y.o. male who has a hx of benign prostatic hyperplasia was brought in by EMS presents to the Emergency Department complaining of constant, moderate, gradual worsening bilateral leg pain with bilateral feet edema onset two weeks ago. He has been having associated loss of appetite due to severity of the the pain. Pt is unable to apply pressure on his feet or ambulate secondary to pain. He has not taken any medication to alleviate his symptoms. Daughter denies back pain, weakness, fever, nausea, vomiting, diarrhea, recent injuries, and hx of HTN/DM.  No known injury.  Patient seen on 12/16 with similar symptoms.  Elevated URic acid.  Treated for possible cellulitis and gout.  Past Medical History  Diagnosis Date  . BPH (benign prostatic hyperplasia)    Past Surgical History  Procedure Laterality Date  . None    . Transurethral resection of prostate N/A 03/16/2013    Procedure: TRANSURETHRAL RESECTION OF THE PROSTATE WITH GYRUS INSTRUMENTS;  Surgeon: Marcine Matar, MD;  Location: [facility];  Service: Urology;  Laterality: N/A;   Family History  Problem Relation Age of Onset  . Other      Negative CAD  . Other      Negative DM2  . Other      Negative HTN  . Other      Negative Cancer   Social History  Substance Use Topics  . Smoking status: Current Every Day Smoker -- 0.25 packs/day for 62 years    Types: Cigarettes  . Smokeless tobacco: Never Used  . Alcohol Use: No    Review of Systems   Constitutional: Negative for fever.  Gastrointestinal: Negative for nausea, vomiting and diarrhea.  Musculoskeletal: Positive for joint swelling and arthralgias. Negative for back pain.  Skin: Positive for color change.  All other systems reviewed and are negative.     Allergies  Review of patient's allergies indicates no known allergies.  Home Medications   Prior to Admission medications   Medication Sig Start Date End Date Taking? Authorizing Provider  HYDROcodone-acetaminophen (NORCO/VICODIN) 5-325 MG tablet Take 1 tablet by mouth every 4 (four) hours as needed. 12/10/15  Yes Alvira Monday, MD  benzonatate (TESSALON) 100 MG capsule Take 1 capsule (100 mg total) by mouth 2 (two) times daily as needed for cough. Patient not taking: Reported on 12/09/2015 04/29/15   Pricilla Loveless, MD  clindamycin (CLEOCIN) 150 MG capsule Take 2 capsules (300 mg total) by mouth 3 (three) times daily. 01/06/16   Shon Baton, MD  ferrous sulfate 325 (65 FE) MG tablet Take 1 tablet (325 mg total) by mouth daily with breakfast. Patient not taking: Reported on 04/29/2015 01/24/15   Doris Cheadle, MD  omeprazole (PRILOSEC) 20 MG capsule Take 1 capsule (20 mg total) by mouth daily. Patient not taking: Reported on 04/29/2015 04/04/13   Osvaldo Shipper, MD  oxyCODONE-acetaminophen (PERCOCET/ROXICET) 5-325 MG tablet Take 1 tablet by mouth every 6 (six) hours as needed for severe pain. 01/06/16  Shon Baton, MD   Triage vitals: BP 123/67 mmHg  Pulse 92  Temp(Src) 99.6 F (37.6 C) (Oral)  Resp 16  SpO2 97% Physical Exam  Constitutional: He is oriented to person, place, and time. No distress.  Elderly, frail appearing  HENT:  Head: Normocephalic and atraumatic.  Cardiovascular: Normal rate, regular rhythm and normal heart sounds.   No murmur heard. Pulmonary/Chest: Effort normal and breath sounds normal. No respiratory distress. He has no wheezes.  Musculoskeletal: He exhibits edema.  BL pedal edema  which appears limited to mostly the dorsum of the feet and toes, ttp along the plantar fascia R>L, mild errythema and warmth to the R foot  Neurological: He is alert and oriented to person, place, and time.  Skin:  See above  Psychiatric: He has a normal mood and affect.  Nursing note and vitals reviewed.  Right foot  ED Course  Procedures  DIAGNOSTIC STUDIES: Oxygen Saturation is 97% on RA, normal by my interpretation.  COORDINATION OF CARE:  11:17 PM Discussed treatment plan with pt and daughter at bedside and they agreed to plan.  Labs Review Labs Reviewed  CBC WITH DIFFERENTIAL/PLATELET - Abnormal; Notable for the following:    RBC 3.70 (*)    Hemoglobin 11.2 (*)    HCT 32.5 (*)    All other components within normal limits  BASIC METABOLIC PANEL - Abnormal; Notable for the following:    Glucose, Bld 103 (*)    Creatinine, Ser 1.43 (*)    Calcium 8.8 (*)    GFR calc non Af Amer 43 (*)    GFR calc Af Amer 50 (*)    All other components within normal limits  I-STAT CG4 LACTIC ACID, ED    Imaging Review Dg Foot Complete Left  01/06/2016  CLINICAL DATA:  82 year old male with generalized weakness and foot pain. EXAM: LEFT FOOT - COMPLETE 3+ VIEW COMPARISON:  Right foot radiograph dated 01/05/2016 FINDINGS: There is no acute fracture or dislocation. Mild osteopenia. The soft tissues are grossly unremarkable. IMPRESSION: Negative. Electronically Signed   By: Elgie Collard M.D.   On: 01/06/2016 01:11   Dg Foot Complete Right  01/06/2016  CLINICAL DATA:  82 year old male with generalized weakness and foot pain. EXAM: RIGHT FOOT COMPLETE - 3+ VIEW COMPARISON:  Left foot radiograph dated 01/05/2016 FINDINGS: No acute fracture or dislocation. The bones are mildly osteopenic. The soft tissues are grossly unremarkable. IMPRESSION: No acute findings. Electronically Signed   By: Elgie Collard M.D.   On: 01/06/2016 01:10   I have personally reviewed and evaluated these images and lab  results as part of my medical decision-making.   EKG Interpretation None      MDM   Final diagnoses:  Foot pain, bilateral  Cellulitis of right lower extremity    Patient presents with BLE swelling and foot pain.  See previously and treated for cellulitis/gout.  Nontoxic.  Afebrile.  Swelling limited to the feet with what appears to be mild cellulitis of the RLE.  No joint swelling.  Atypical for gout.  Basic labwork reassuring.  No leukocytosis.  Not a candidate for NSAIDs given creatinine and age.  WIll treat symptomatically and place on clinda to cover for cellulitis.  Discharge instructions given through language interpretor.  After history, exam, and medical workup I feel the patient has been appropriately medically screened and is safe for discharge home. Pertinent diagnoses were discussed with the patient. Patient was given return precautions.  I personally performed  the services described in this documentation, which was scribed in my presence. The recorded information has been reviewed and is accurate.   Shon Batonourtney F Horton, MD 01/07/16 1630

## 2016-01-06 ENCOUNTER — Emergency Department (HOSPITAL_COMMUNITY): Payer: Medicaid Other

## 2016-01-06 LAB — BASIC METABOLIC PANEL
Anion gap: 11 (ref 5–15)
BUN: 16 mg/dL (ref 6–20)
CHLORIDE: 105 mmol/L (ref 101–111)
CO2: 24 mmol/L (ref 22–32)
Calcium: 8.8 mg/dL — ABNORMAL LOW (ref 8.9–10.3)
Creatinine, Ser: 1.43 mg/dL — ABNORMAL HIGH (ref 0.61–1.24)
GFR calc Af Amer: 50 mL/min — ABNORMAL LOW (ref >60)
GFR calc non Af Amer: 43 mL/min — ABNORMAL LOW (ref >60)
GLUCOSE: 103 mg/dL — AB (ref 65–99)
POTASSIUM: 4.3 mmol/L (ref 3.5–5.1)
SODIUM: 140 mmol/L (ref 135–145)

## 2016-01-06 LAB — I-STAT CG4 LACTIC ACID, ED: LACTIC ACID, VENOUS: 0.76 mmol/L (ref 0.5–2.0)

## 2016-01-06 MED ORDER — OXYCODONE-ACETAMINOPHEN 5-325 MG PO TABS
1.0000 | ORAL_TABLET | Freq: Once | ORAL | Status: AC
  Administered 2016-01-06: 1 via ORAL
  Filled 2016-01-06: qty 1

## 2016-01-06 MED ORDER — CLINDAMYCIN HCL 150 MG PO CAPS
300.0000 mg | ORAL_CAPSULE | Freq: Once | ORAL | Status: AC
  Administered 2016-01-06: 300 mg via ORAL
  Filled 2016-01-06: qty 2

## 2016-01-06 MED ORDER — CLINDAMYCIN HCL 150 MG PO CAPS: 300.0000 mg | ORAL_CAPSULE | Freq: Three times a day (TID) | ORAL | Status: DC

## 2016-01-06 MED ORDER — OXYCODONE-ACETAMINOPHEN 5-325 MG PO TABS: 1.0000 | ORAL_TABLET | Freq: Four times a day (QID) | ORAL | Status: DC | PRN

## 2016-01-06 NOTE — ED Notes (Signed)
Pt ambulated with standby hands on assistance.

## 2016-01-06 NOTE — Discharge Instructions (Signed)

## 2016-05-06 IMAGING — CR DG FOOT COMPLETE 3+V*R*
3 series · 3 of 3 positions shown · non-contrast
Comparison: Left foot radiograph dated 01/05/2016

CLINICAL DATA: 88-year-old male with generalized weakness and foot
pain.

EXAM:
RIGHT FOOT COMPLETE - 3+ VIEW

[foot ap]
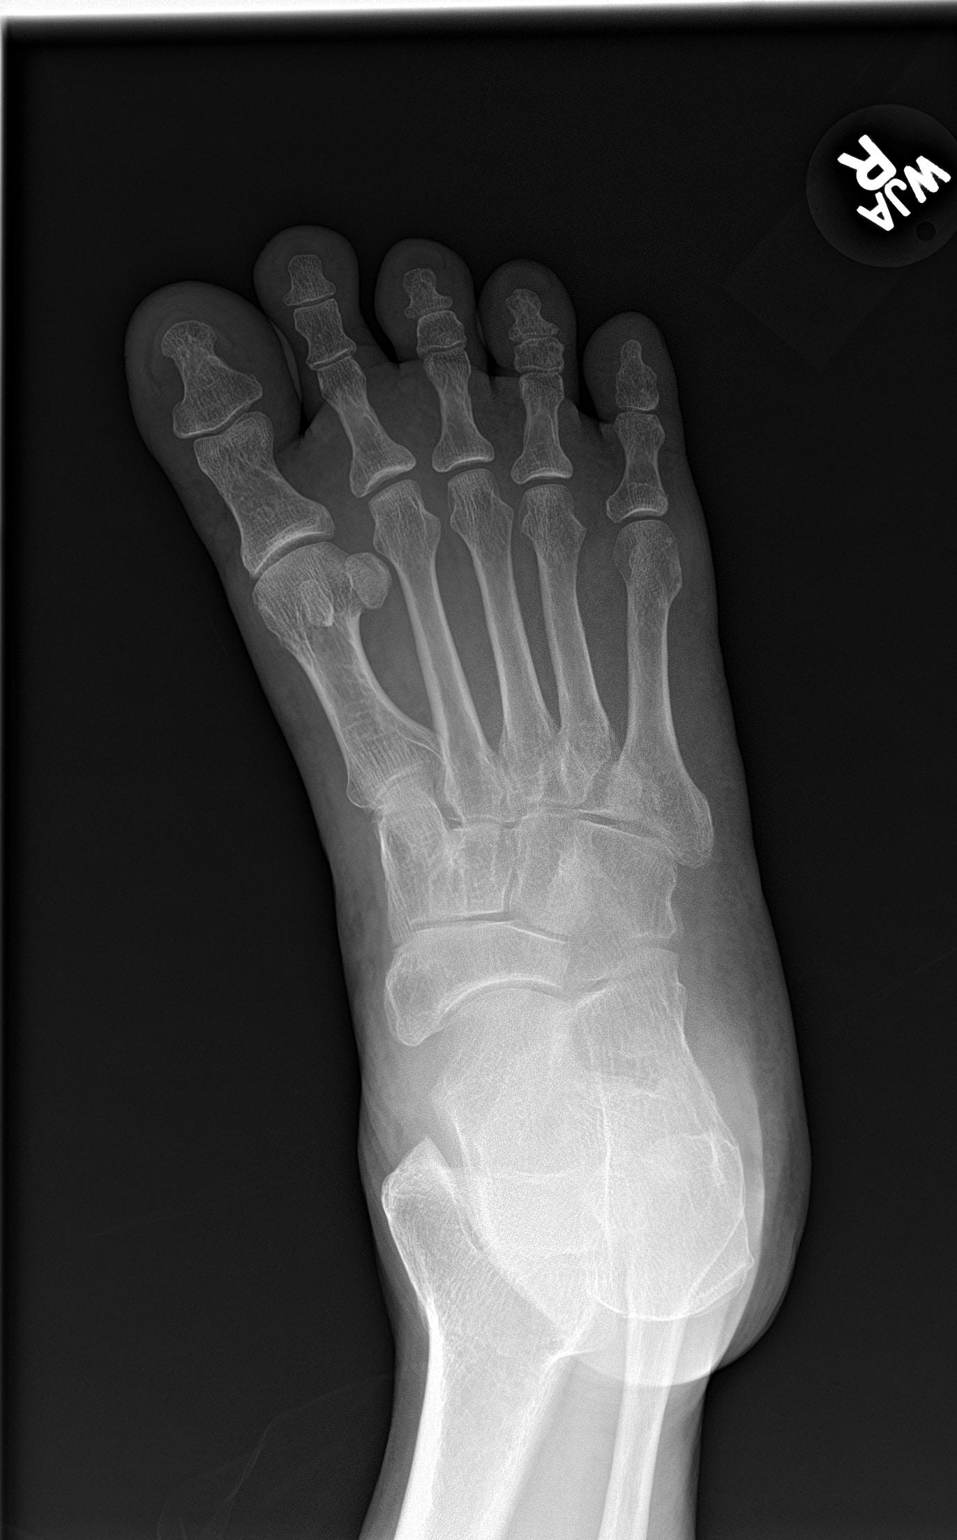

[foot obl]
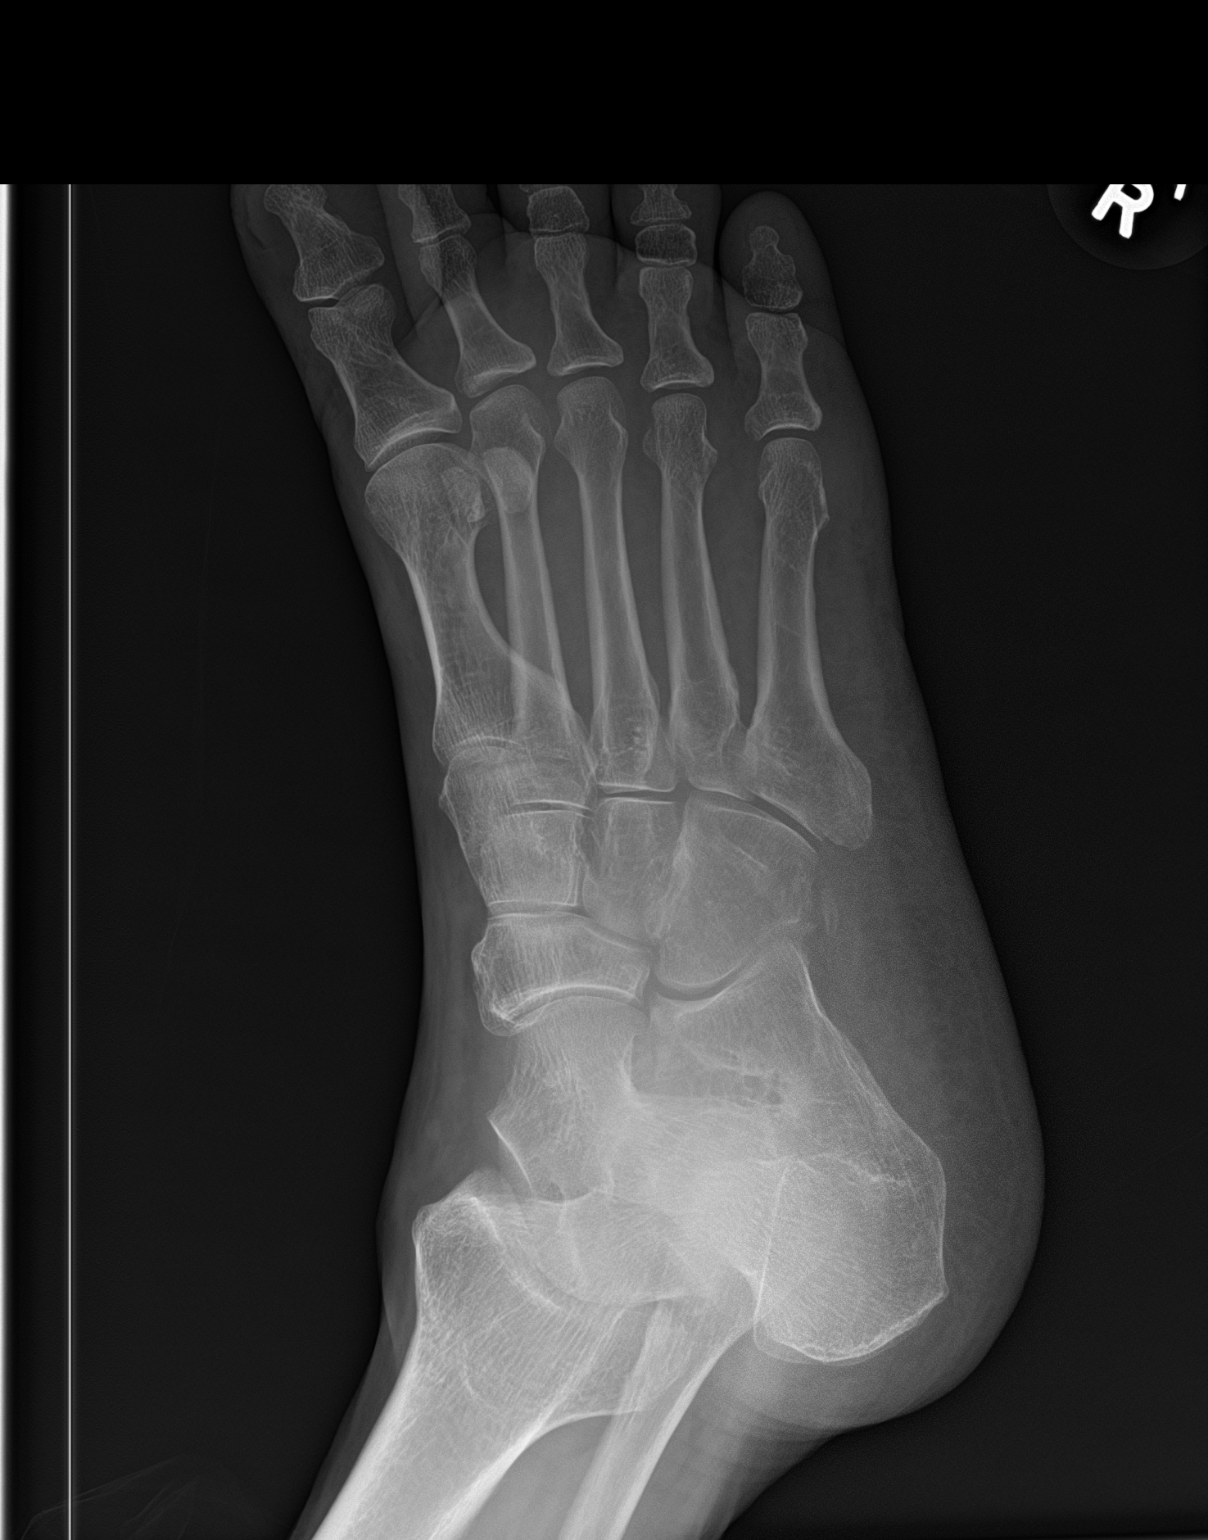

[foot lat]
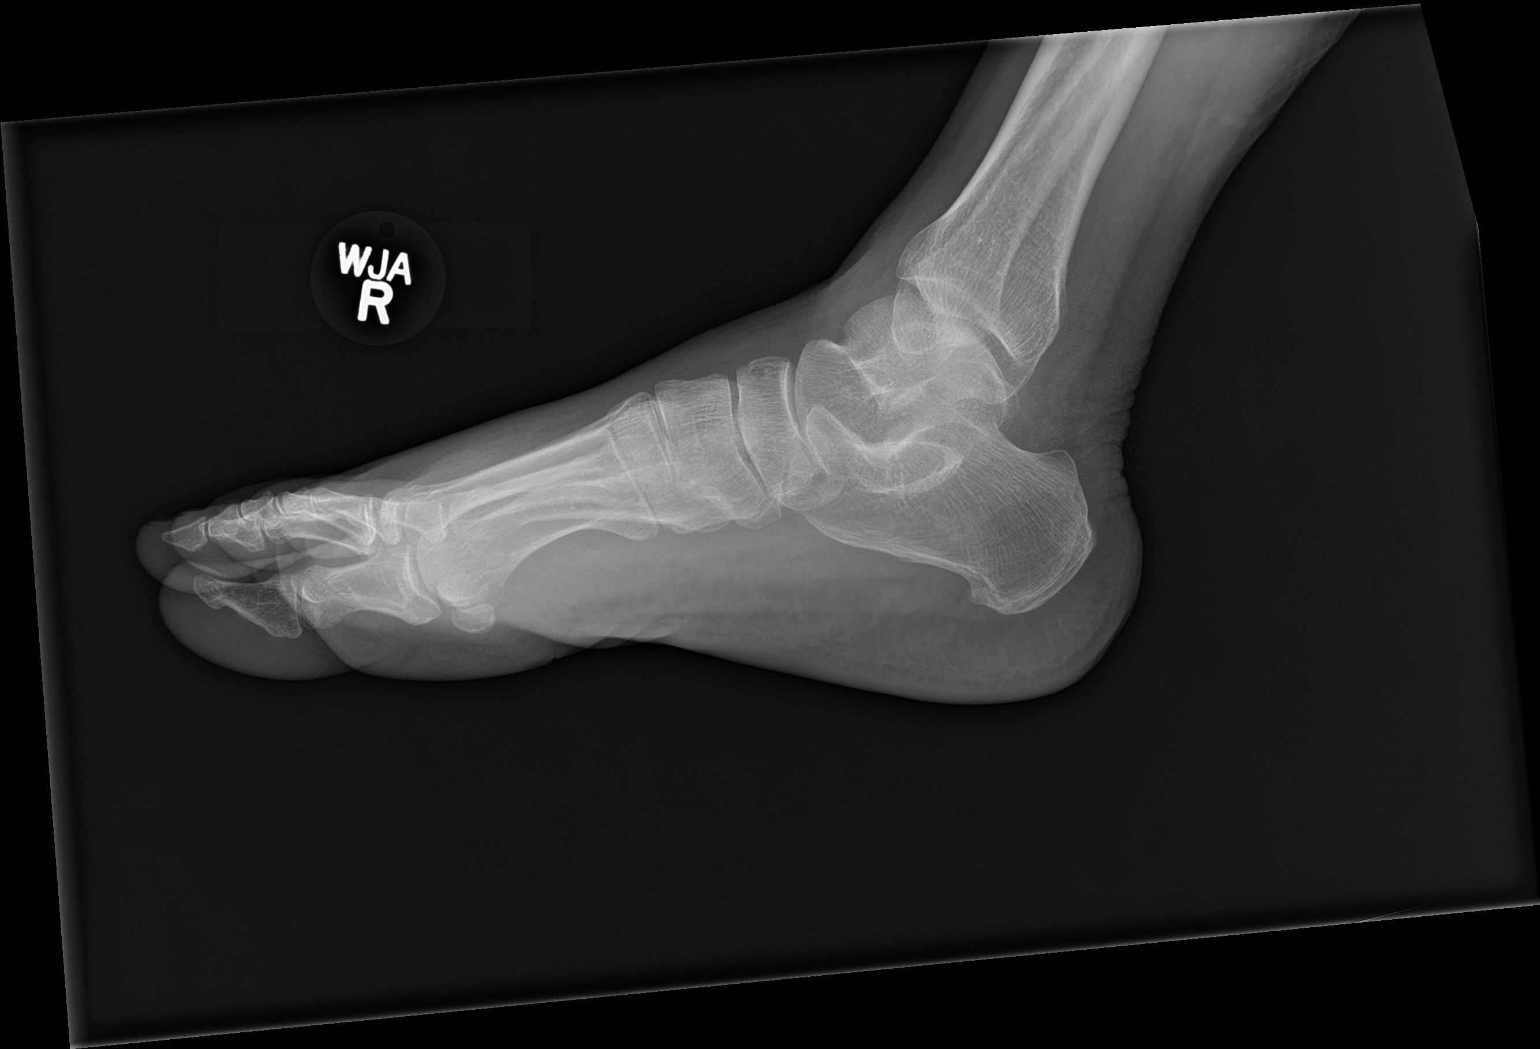

[3 of 3 positions shown; findings below may reference images not displayed]

FINDINGS: No acute fracture or dislocation. The bones are mildly osteopenic.
The soft tissues are grossly unremarkable.
IMPRESSION: No acute findings.

## 2016-05-06 IMAGING — CR DG FOOT COMPLETE 3+V*L*
3 series · 3 of 3 positions shown · non-contrast
Comparison: Right foot radiograph dated 01/05/2016

CLINICAL DATA: 88-year-old male with generalized weakness and foot
pain.

EXAM:
LEFT FOOT - COMPLETE 3+ VIEW

[foot ap]
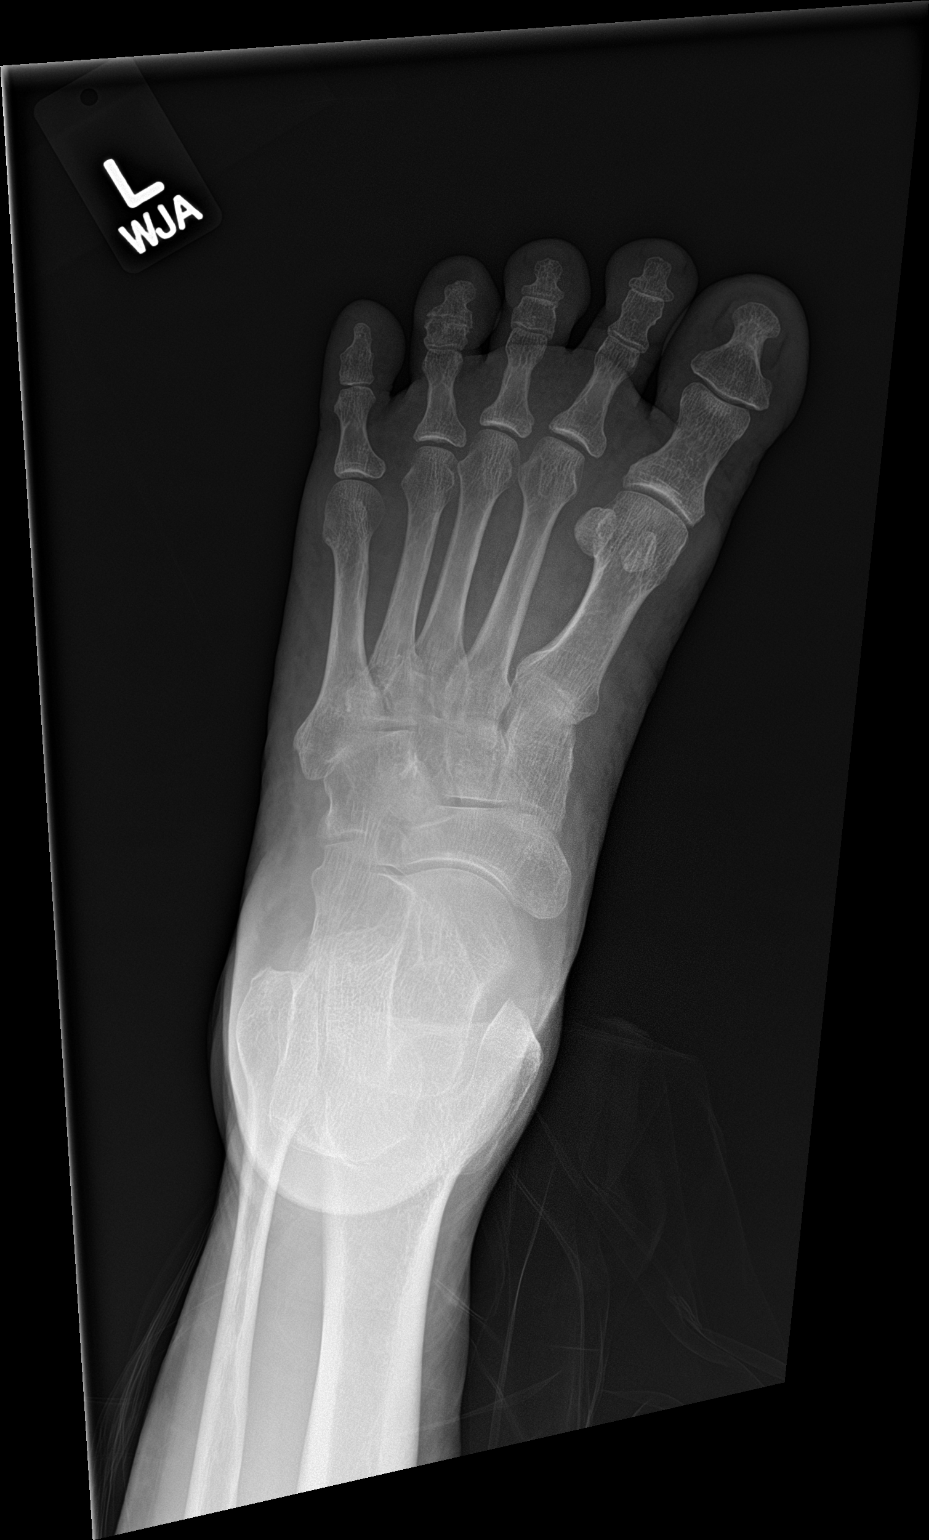

[foot obl]
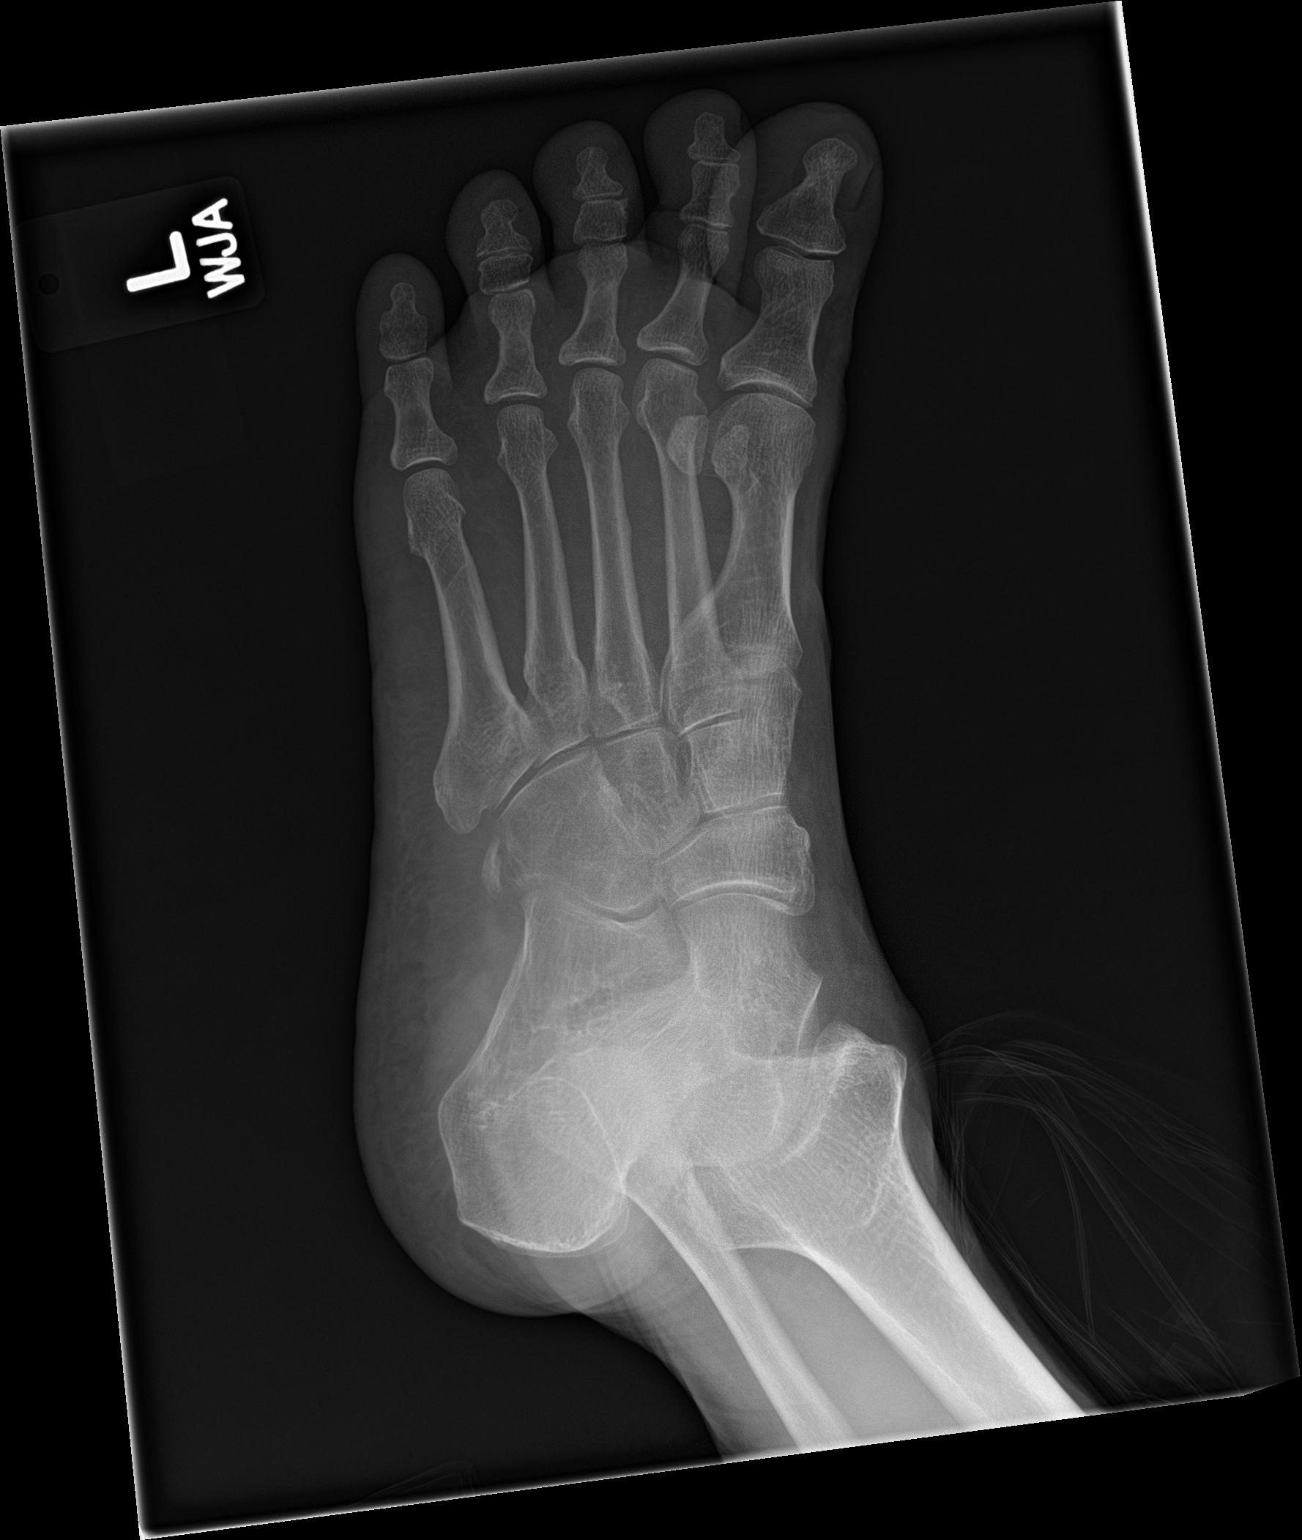

[foot lat]
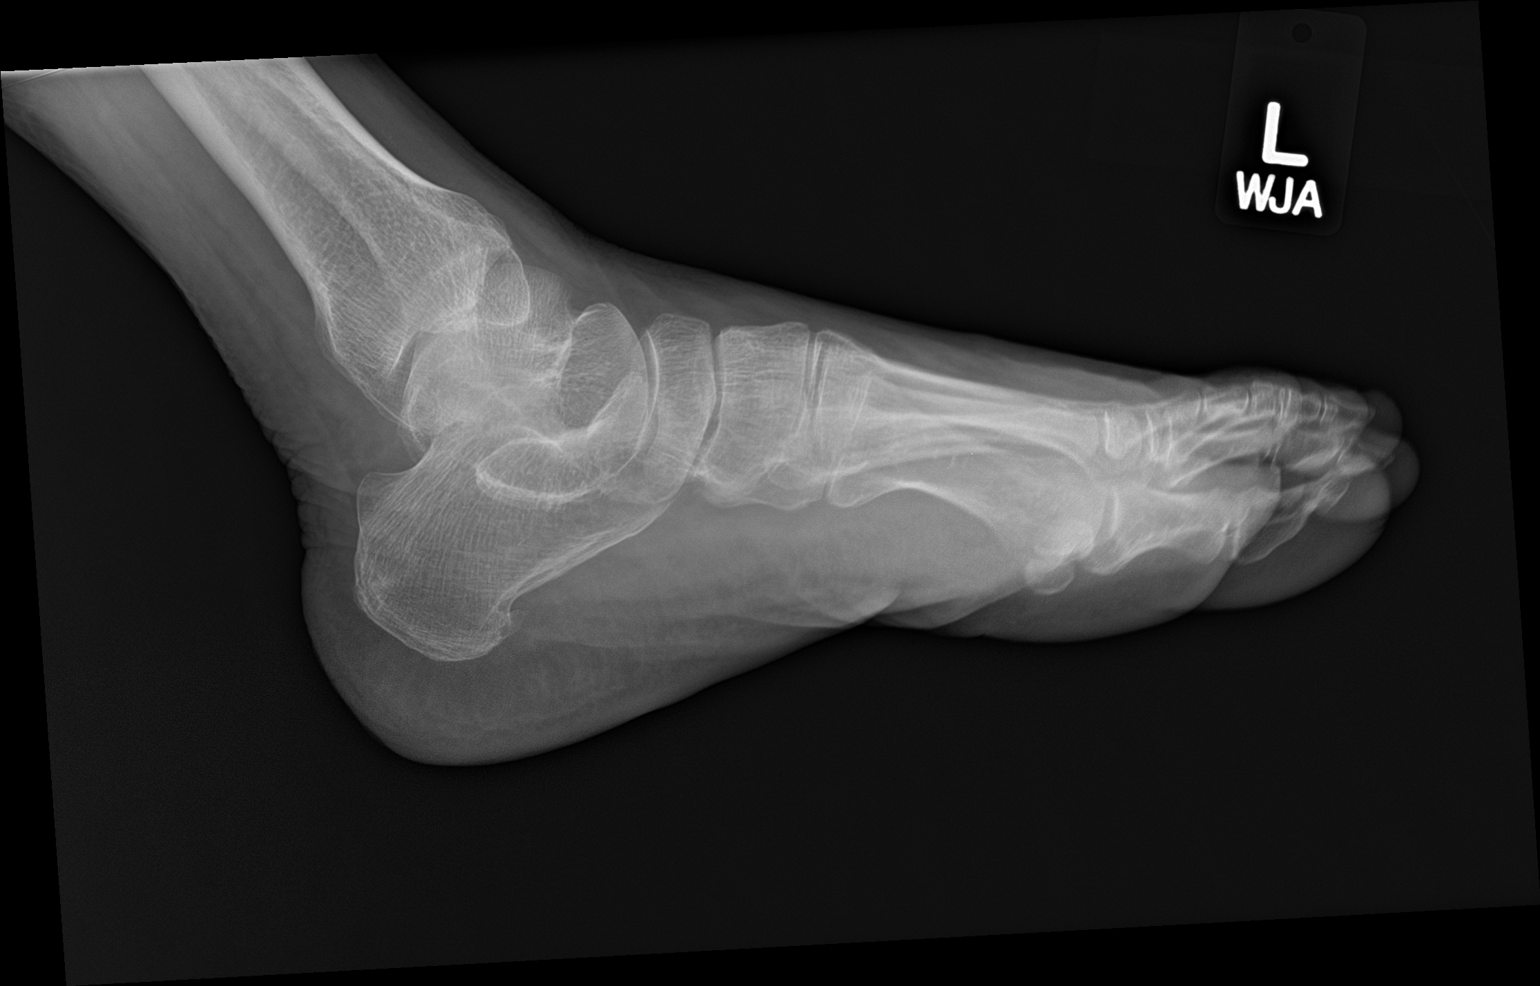

[3 of 3 positions shown; findings below may reference images not displayed]

FINDINGS: There is no acute fracture or dislocation. Mild osteopenia. The soft
tissues are grossly unremarkable.
IMPRESSION: Negative.

## 2017-08-14 NOTE — Congregational Nurse Program (Signed)
Congregational Nurse Program Note  Date of Encounter: 08/14/2017  Past Medical History: Past Medical History:  Diagnosis Date  . BPH (benign prostatic hyperplasia)     Encounter Details:     CNP Questionnaire - 08/14/17 1013      Patient Demographics   Is this a new or existing patient? 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 Not Applicable   Does patient have a medical provider? Yes   Patient referred to Follow up with established PCP   Was a mental health screening completed? (GAINS tool) No   Does patient have dental issues? No   Does patient have vision issues? Yes   Was a vision referral made? Yes   Does your patient have an abnormal blood pressure today? No   Since previous encounter, have you referred patient for abnormal blood pressure that resulted in a new diagnosis or medication change? No   Does your patient have an abnormal blood glucose today? No   Since previous encounter, have you referred patient for abnormal blood glucose that resulted in a new diagnosis or medication change? No   Was there a life-saving intervention made? No    Brief office visit for this Burmese speaking man sharing recent surgical experience, "appendectomy" on 07/25/17. Attending English classes this week without problems. Observed four small healed surgical sites of upper right, lower left and umbilical areas healed  w/o drainage. VS stable. Afebrile.  Reports slight pain upon walking. Follow-up appointment with surgeon 08/16. States, no problems. Denies problems eating and elimination or sleep. Continues to experience difficulty seeing clearly in class. Uses reading glasses. Follow-up prn with PCP. Schedule  eye exam. Return to nurse office prn. Ferol Luz, RN/CN

## 2017-11-17 ENCOUNTER — Observation Stay (HOSPITAL_COMMUNITY)
Admission: EM | Admit: 2017-11-17 | Discharge: 2017-11-18 | Disposition: A | Discharge status: Home / Self Care | Payer: Medicaid Other | Attending: Internal Medicine | Admitting: Internal Medicine

## 2017-11-17 ENCOUNTER — Other Ambulatory Visit: Payer: Self-pay

## 2017-11-17 ENCOUNTER — Emergency Department (HOSPITAL_COMMUNITY): Payer: Medicaid Other

## 2017-11-17 ENCOUNTER — Encounter (HOSPITAL_COMMUNITY): Payer: Self-pay | Admitting: *Deleted

## 2017-11-17 DIAGNOSIS — Z9079 Acquired absence of other genital organ(s): Secondary | ICD-10-CM | POA: Diagnosis not present

## 2017-11-17 DIAGNOSIS — D696 Thrombocytopenia, unspecified: Secondary | ICD-10-CM | POA: Diagnosis present

## 2017-11-17 DIAGNOSIS — N183 Chronic kidney disease, stage 3 (moderate): Secondary | ICD-10-CM | POA: Insufficient documentation

## 2017-11-17 DIAGNOSIS — F17210 Nicotine dependence, cigarettes, uncomplicated: Secondary | ICD-10-CM | POA: Diagnosis not present

## 2017-11-17 DIAGNOSIS — K8020 Calculus of gallbladder without cholecystitis without obstruction: Secondary | ICD-10-CM | POA: Insufficient documentation

## 2017-11-17 DIAGNOSIS — K922 Gastrointestinal hemorrhage, unspecified: Secondary | ICD-10-CM

## 2017-11-17 DIAGNOSIS — K210 Gastro-esophageal reflux disease with esophagitis: Secondary | ICD-10-CM | POA: Insufficient documentation

## 2017-11-17 DIAGNOSIS — E872 Acidosis: Secondary | ICD-10-CM | POA: Diagnosis not present

## 2017-11-17 DIAGNOSIS — N189 Chronic kidney disease, unspecified: Secondary | ICD-10-CM | POA: Diagnosis present

## 2017-11-17 DIAGNOSIS — N12 Tubulo-interstitial nephritis, not specified as acute or chronic: Secondary | ICD-10-CM | POA: Diagnosis not present

## 2017-11-17 DIAGNOSIS — E871 Hypo-osmolality and hyponatremia: Secondary | ICD-10-CM | POA: Diagnosis not present

## 2017-11-17 DIAGNOSIS — D631 Anemia in chronic kidney disease: Secondary | ICD-10-CM | POA: Insufficient documentation

## 2017-11-17 DIAGNOSIS — R42 Dizziness and giddiness: Secondary | ICD-10-CM | POA: Insufficient documentation

## 2017-11-17 DIAGNOSIS — K2950 Unspecified chronic gastritis without bleeding: Principal | ICD-10-CM | POA: Insufficient documentation

## 2017-11-17 DIAGNOSIS — N400 Enlarged prostate without lower urinary tract symptoms: Secondary | ICD-10-CM | POA: Diagnosis not present

## 2017-11-17 DIAGNOSIS — H9190 Unspecified hearing loss, unspecified ear: Secondary | ICD-10-CM | POA: Insufficient documentation

## 2017-11-17 DIAGNOSIS — D649 Anemia, unspecified: Secondary | ICD-10-CM | POA: Diagnosis not present

## 2017-11-17 DIAGNOSIS — N179 Acute kidney failure, unspecified: Secondary | ICD-10-CM | POA: Insufficient documentation

## 2017-11-17 DIAGNOSIS — K449 Diaphragmatic hernia without obstruction or gangrene: Secondary | ICD-10-CM | POA: Insufficient documentation

## 2017-11-17 DIAGNOSIS — I998 Other disorder of circulatory system: Secondary | ICD-10-CM

## 2017-11-17 DIAGNOSIS — K920 Hematemesis: Secondary | ICD-10-CM

## 2017-11-17 HISTORY — DX: Gastrointestinal hemorrhage, unspecified: K92.2

## 2017-11-17 LAB — URINALYSIS, ROUTINE W REFLEX MICROSCOPIC
Bacteria, UA: NONE SEEN
Bilirubin Urine: NEGATIVE
GLUCOSE, UA: NEGATIVE mg/dL
Ketones, ur: NEGATIVE mg/dL
Nitrite: NEGATIVE
PH: 6 (ref 5.0–8.0)
PROTEIN: NEGATIVE mg/dL
RBC / HPF: NONE SEEN RBC/hpf (ref 0–5)
SQUAMOUS EPITHELIAL / LPF: NONE SEEN
Specific Gravity, Urine: 1.01 (ref 1.005–1.030)

## 2017-11-17 LAB — COMPREHENSIVE METABOLIC PANEL
ALT: 17 U/L (ref 17–63)
AST: 31 U/L (ref 15–41)
Albumin: 3.6 g/dL (ref 3.5–5.0)
Alkaline Phosphatase: 85 U/L (ref 38–126)
Anion gap: 8 (ref 5–15)
BUN: 14 mg/dL (ref 6–20)
CHLORIDE: 107 mmol/L (ref 101–111)
CO2: 23 mmol/L (ref 22–32)
CREATININE: 1.16 mg/dL (ref 0.61–1.24)
Calcium: 8.8 mg/dL — ABNORMAL LOW (ref 8.9–10.3)
GFR calc Af Amer: >60 mL/min (ref >60)
GFR calc non Af Amer: 55 mL/min — ABNORMAL LOW (ref >60)
GLUCOSE: 117 mg/dL — AB (ref 65–99)
Potassium: 4.1 mmol/L (ref 3.5–5.1)
SODIUM: 138 mmol/L (ref 135–145)
Total Bilirubin: 0.7 mg/dL (ref 0.3–1.2)
Total Protein: 7.5 g/dL (ref 6.5–8.1)

## 2017-11-17 LAB — TYPE AND SCREEN
ABO/RH(D): O POS
Antibody Screen: NEGATIVE

## 2017-11-17 LAB — TROPONIN I: Troponin I: <0.03 ng/mL (ref <0.03)

## 2017-11-17 LAB — CBC
HCT: 36 % — ABNORMAL LOW (ref 39.0–52.0)
Hemoglobin: 12.1 g/dL — ABNORMAL LOW (ref 13.0–17.0)
MCH: 30.4 pg (ref 26.0–34.0)
MCHC: 33.6 g/dL (ref 30.0–36.0)
MCV: 90.5 fL (ref 78.0–100.0)
PLATELETS: 112 10*3/uL — AB (ref 150–400)
RBC: 3.98 MIL/uL — ABNORMAL LOW (ref 4.22–5.81)
RDW: 13.3 % (ref 11.5–15.5)
WBC: 6.3 10*3/uL (ref 4.0–10.5)

## 2017-11-17 LAB — ABO/RH: ABO/RH(D): O POS

## 2017-11-17 LAB — POC OCCULT BLOOD, ED: Fecal Occult Bld: NEGATIVE

## 2017-11-17 LAB — LIPASE, BLOOD: Lipase: 29 U/L (ref 11–51)

## 2017-11-17 LAB — PROTIME-INR
INR: 1.1
Prothrombin Time: 14.1 seconds (ref 11.4–15.2)

## 2017-11-17 LAB — HEMOGLOBIN AND HEMATOCRIT, BLOOD
HCT: 33 % — ABNORMAL LOW (ref 39.0–52.0)
HEMOGLOBIN: 10.9 g/dL — AB (ref 13.0–17.0)

## 2017-11-17 MED ORDER — ONDANSETRON HCL 4 MG/2ML IJ SOLN
4.0000 mg | Freq: Four times a day (QID) | INTRAMUSCULAR | Status: DC | PRN
  Administered 2017-11-17 – 2017-11-18 (×2): 4 mg via INTRAVENOUS

## 2017-11-17 MED ORDER — ACETAMINOPHEN 650 MG RE SUPP: 650.0000 mg | Freq: Four times a day (QID) | RECTAL | Status: DC | PRN

## 2017-11-17 MED ORDER — ONDANSETRON HCL 4 MG PO TABS: 4.0000 mg | ORAL_TABLET | Freq: Four times a day (QID) | ORAL | Status: DC | PRN

## 2017-11-17 MED ORDER — PANTOPRAZOLE SODIUM 40 MG IV SOLR: 40.0000 mg | Freq: Two times a day (BID) | INTRAVENOUS | Status: DC

## 2017-11-17 MED ORDER — ALBUTEROL SULFATE (2.5 MG/3ML) 0.083% IN NEBU: 2.5000 mg | INHALATION_SOLUTION | RESPIRATORY_TRACT | Status: DC | PRN

## 2017-11-17 MED ORDER — ACETAMINOPHEN 325 MG PO TABS
650.0000 mg | ORAL_TABLET | Freq: Four times a day (QID) | ORAL | Status: DC | PRN
  Administered 2017-11-18: 650 mg via ORAL
  Filled 2017-11-17: qty 2

## 2017-11-17 MED ORDER — ONDANSETRON HCL 4 MG/2ML IJ SOLN
4.0000 mg | Freq: Once | INTRAMUSCULAR | Status: DC | PRN
  Filled 2017-11-17 (×2): qty 2

## 2017-11-17 MED ORDER — SODIUM CHLORIDE 0.9 % IV SOLN
INTRAVENOUS | Status: DC
  Administered 2017-11-17: 20:00:00 via INTRAVENOUS
  Administered 2017-11-18 (×2): 75 mL/h via INTRAVENOUS

## 2017-11-17 MED ORDER — PANTOPRAZOLE SODIUM 40 MG IV SOLR
80.0000 mg | Freq: Once | INTRAVENOUS | Status: AC
  Administered 2017-11-17: 80 mg via INTRAVENOUS
  Filled 2017-11-17: qty 80

## 2017-11-17 MED ORDER — SODIUM CHLORIDE 0.9 % IV SOLN
Freq: Once | INTRAVENOUS | Status: AC
  Administered 2017-11-17: 17:00:00 via INTRAVENOUS

## 2017-11-17 MED ORDER — SODIUM CHLORIDE 0.9 % IV SOLN
8.0000 mg/h | INTRAVENOUS | Status: DC
  Administered 2017-11-17 – 2017-11-18 (×2): 8 mg/h via INTRAVENOUS
  Filled 2017-11-17 (×3): qty 80

## 2017-11-17 NOTE — ED Notes (Signed)
Admitting Provider at bedside. 

## 2017-11-17 NOTE — ED Notes (Signed)
Attempted to call report x 2.  First call unanswered as no Diplomatic Services operational officersecretary is working at this time.  Upon second attempt, RN was medicating a pt and will call back in 5 min.

## 2017-11-17 NOTE — H&P (Signed)
History and Physical    Martin Tanner Martin Tanner DOB: 05/21/1928 DOA: 11/17/2017  Referring MD/NP/PA: Buel ReamAlexandra Law, PA-C PCP: System, Pcp Not In  Patient coming from: Home via EMS  Chief Complaint: Vomiting  I have personally briefly reviewed patient's [facility]   HPI: Martin Tanner is a 83 y.o. Napali speaking male with medical history significant of BPH s/p transurethral resection of the prostate; presents with complaints of nausea and vomiting since around 2 PM this afternoon.  History is obtained with the use of interpreter services and patient's son-in-law giving most of history.  Patient had been in his normal state of health prior to onset of symptoms.  Emesis was noted to be significantly dark and he is previously had dark stools in the past.  Associated symptoms include dizziness/lightheadedness.  Denies any fever, chills, shortness of breath, chest pain, abdominal pain, dysuria, blood thinners, or NSAID use. Family reports a remote history of alcohol and tobacco use, but no no use in over 5 years.  He reportedly tried some anti-nausea medicine without relief prior to arrival.  ED Course: Upon admission into the emergency department patient was seen to be afebrile with blood pressure 121/83-178/104, and all other vital signs are relatively within normal limits.  Labs revealed WBC 6.3, hemoglobin 12.1, platelet count 112, BUN 14, and creatinine 1.16.  Urinalysis was negative for signs of infection.  Stool guaiacs were negative for any signs of blood.  Patient had one episode of emesis while in the ED which was tested and noted to be positive for blood.  Patient was given 1 L normal saline IV fluids, Zofran, and started on a Protonix drip.  Dr. Ewing SchleinMagod of GI was consulted and recommended keeping patient NPO overnight and they would see in the morning.  [facility] called to admit.   Review of Systems  Constitutional: Negative for fever and weight loss.  HENT:  Positive for hearing loss. Negative for nosebleeds.   Eyes: Negative for photophobia and pain.  Respiratory: Negative for shortness of breath and wheezing.   Cardiovascular: Negative for chest pain and leg swelling.  Gastrointestinal: Positive for nausea and vomiting. Negative for blood in stool.  Genitourinary: Negative for dysuria and frequency.  Musculoskeletal: Negative for falls, joint pain and myalgias.  Skin: Negative for itching and rash.  Neurological: Positive for dizziness.  Psychiatric/Behavioral: Negative for hallucinations and substance abuse.    Past Medical History:  Diagnosis Date  . BPH (benign prostatic hyperplasia)     Past Surgical History:  Procedure Laterality Date  . None    . TRANSURETHRAL RESECTION OF PROSTATE N/A 03/16/2013   Procedure: TRANSURETHRAL RESECTION OF THE PROSTATE WITH GYRUS INSTRUMENTS;  Surgeon: Marcine MatarStephen Dahlstedt, MD;  Location: [facility];  Service: Urology;  Laterality: N/A;     reports that he has been smoking cigarettes.  He has a 15.50 pack-year smoking history. he has never used smokeless tobacco. He reports that he does not drink alcohol or use drugs.  No Known Allergies  Family History  Problem Relation Age of Onset  . Other Unknown        Negative CAD  . Other Unknown        Negative DM2  . Other Unknown        Negative HTN  . Other Unknown        Negative Cancer    Prior to Admission medications   Not on File    Physical Exam:  Constitutional:  Elderly male who appears ill Vitals:   11/17/17 1635 11/17/17 1830 11/17/17 1845 11/17/17 1900  BP: (!) 178/104 (!) 144/84 133/89 121/83  Pulse: 77     Resp: 17 15 17 14   Temp: 97.8 F (36.6 C)     TempSrc: Oral     SpO2: 100%      Eyes: PERRL, lids and conjunctivae normal ENMT: Mucous membranes are dry. Posterior pharynx clear of any exudate or lesions.  Neck: normal, supple, no masses, no thyromegaly Respiratory: clear to auscultation bilaterally, no wheezing, no  crackles. Normal respiratory effort. No accessory muscle use.  Cardiovascular: Regular rate and rhythm, no murmurs / rubs / gallops. No extremity edema. 2+ pedal pulses. No carotid bruits.  Abdomen: no tenderness, no masses palpated. No hepatosplenomegaly. Bowel sounds positive.  Musculoskeletal: no clubbing / cyanosis. No joint deformity upper and lower extremities. Good ROM, no contractures. Normal muscle tone.  Skin: no rashes, lesions, ulcers. No induration Neurologic: CN 2-12 grossly intact. Sensation intact, DTR normal. Strength 5/5 in all 4.  Psychiatric: Normal judgment and insight. Alert and oriented x 3. Normal mood.     Labs on Admission: I have personally reviewed following labs and imaging studies  CBC: Recent Labs  Lab 11/17/17 1656  WBC 6.3  HGB 12.1*  HCT 36.0*  MCV 90.5  PLT 112*   Basic Metabolic Panel: Recent Labs  Lab 11/17/17 1656  NA 138  K 4.1  CL 107  CO2 23  GLUCOSE 117*  BUN 14  CREATININE 1.16  CALCIUM 8.8*   GFR: CrCl cannot be calculated (Unknown ideal weight.). Liver Function Tests: Recent Labs  Lab 11/17/17 1656  AST 31  ALT 17  ALKPHOS 85  BILITOT 0.7  PROT 7.5  ALBUMIN 3.6   Recent Labs  Lab 11/17/17 1656  LIPASE 29   No results for input(s): AMMONIA in the last 168 hours. Coagulation Profile: No results for input(s): INR, PROTIME in the last 168 hours. Cardiac Enzymes: No results for input(s): CKTOTAL, CKMB, CKMBINDEX, TROPONINI in the last 168 hours. BNP (last 3 results) No results for input(s): PROBNP in the last 8760 hours. HbA1C: No results for input(s): HGBA1C in the last 72 hours. CBG: No results for input(s): GLUCAP in the last 168 hours. Lipid Profile: No results for input(s): CHOL, HDL, LDLCALC, TRIG, CHOLHDL, LDLDIRECT in the last 72 hours. Thyroid Function Tests: No results for input(s): TSH, T4TOTAL, FREET4, T3FREE, THYROIDAB in the last 72 hours. Anemia Panel: No results for input(s): VITAMINB12,  FOLATE, FERRITIN, TIBC, IRON, RETICCTPCT in the last 72 hours. Urine analysis:    Component Value Date/Time   COLORURINE STRAW (A) 11/17/2017 1850   APPEARANCEUR CLEAR 11/17/2017 1850   LABSPEC 1.010 11/17/2017 1850   PHURINE 6.0 11/17/2017 1850   GLUCOSEU NEGATIVE 11/17/2017 1850   HGBUR MODERATE (A) 11/17/2017 1850   BILIRUBINUR NEGATIVE 11/17/2017 1850   BILIRUBINUR neg 02/23/2014 1818   KETONESUR NEGATIVE 11/17/2017 1850   PROTEINUR NEGATIVE 11/17/2017 1850   UROBILINOGEN 0.2 02/23/2014 1818   UROBILINOGEN 0.2 03/31/2013 1700   NITRITE NEGATIVE 11/17/2017 1850   LEUKOCYTESUR TRACE (A) 11/17/2017 1850   Sepsis Labs: No results found for this or any previous visit (from the past 240 hour(s)).   Radiological Exams on Admission: No results found.  EKG: Independently reviewed. Sinus   Assessment/Plan Upper GI bleed w/ hematemesis: Acute.  Patient presents after multiple episodes of vomiting dark emesis.  Emesis found to be positive for blood on testing.  Stool guaiacs  noted to be negative.  Patient was typed and screened and started on Protonix drip. - Admit to telemetry bed - NPO  - Monitor intake and output - Check chest x-ray - Continue Protonix gtt - IV fluids at 75 mL/h - Antiemetics as needed - Appreciate Dr. Ewing Schlein of GI consultative services, will follow up for further recommendations  Normocytic normochromic anemia: Chronic.  Patient's hemoglobin appears to be 12.1 on admission.  Vital signs are currently stable.  Patient has already been typed and screened for possible need of blood products. - Serial monitoring of H&H - Will transfuse blood products, if hemoglobin drops below 8 or patient becomes symptomatic  Chronic kidney disease stage III: Patient presents with a creatinine of 1.16 and baseline creatinine noted. 1.4- 1.5 per review of patient's hospital visits over the last 2 years. - Recheck BMP in a.m.  Thrombocytopenia: chronic.  Patient presents with a  platelet count of 112 on admission review of her record shows a platelet count been intermittently low since 2014.  Liver enzymes show an elevated AST to ALT ratio. - Continue to monitor  DVT prophylaxis: SCDs Code Status: full  Family Communication: Discussed plan of care with patient and family present at bedside. Disposition Plan: TBD Consults called: Gastroenterology Admission status: Observation  Clydie Braun MD [facility] Pager [phone]   If 7PM-7AM, please contact night-coverage [email or web address] Password [facility]  11/17/2017, 7:35 PM

## 2017-11-17 NOTE — ED Triage Notes (Signed)
Per EMS, pt from home, pt speaks Korea[place]ali, very unlimited [place].  Witnessed vomiting per EMS.

## 2017-11-17 NOTE — ED Notes (Signed)
Per Koreaepali interpreter, pt's father-in-law reports pt have been vomiting with dizziness for the past 3 hours.  Denies abd pain or urinary sxs.  EMS reports dark emesis.  LBM was yesterday.  Pt's BP is elevated and actively vomiting.  Pt's emesis appears dark green in color.

## 2017-11-17 NOTE — ED Provider Notes (Signed)
[facility] 3E CHF Provider Note   CSN: [id number] Arrival date & time: 11/17/17  1620     History   Chief Complaint Chief Complaint  Patient presents with  . Emesis    HPI Brekken Abad Manard is a 83 y.o. male history BPH and distant history of alcohol abuse who presents with a three-hour history of dark emesis and dizziness. Patient denies abdominal pain or fever. He had a bowel movement yesterday which was normal. Patient's son-in-Berna Gitto reports he has had dark stools in the past, however none recently. Patient denies chest pain or shortness of breath. Patient denies any recent alcohol use. Patient was feeling well yesterday.  The history is limited by a language barrier. A language interpreter was used.    Past Medical History:  Diagnosis Date  . BPH (benign prostatic hyperplasia)     Patient Active Problem List   Diagnosis Date Noted  . Hematemesis 11/17/2017  . Hyponatremia 04/01/2013  . Pyelonephritis 01/28/2013  . Metabolic acidosis 01/28/2013  . Thrombocytopenia (HCC) 01/28/2013  . ARF (acute renal failure) (HCC) 01/25/2013  . UTI (lower urinary tract infection) 01/25/2013  . Urinary retention 01/25/2013  . Anemia 01/25/2013  . Weakness generalized 01/25/2013    Past Surgical History:  Procedure Laterality Date  . None    . TRANSURETHRAL RESECTION OF PROSTATE N/A 03/16/2013   Procedure: TRANSURETHRAL RESECTION OF THE PROSTATE WITH GYRUS INSTRUMENTS;  Surgeon: Marcine Matar, MD;  Location: [facility];  Service: Urology;  Laterality: N/A;       Home Medications    Prior to Admission medications   Not on File    Family History Family History  Problem Relation Age of Onset  . Other Unknown        Negative CAD  . Other Unknown        Negative DM2  . Other Unknown        Negative HTN  . Other Unknown        Negative Cancer    Social History Social History   Tobacco Use  . Smoking status: Current Every Day Smoker    Packs/day:  0.25    Years: 62.00    Pack years: 15.50    Types: Cigarettes  . Smokeless tobacco: Never Used  Substance Use Topics  . Alcohol use: No    Alcohol/week: 0.0 oz  . Drug use: No     Allergies   Patient has no known allergies.   Review of Systems Review of Systems  Constitutional: Negative for chills and fever.  HENT: Negative for facial swelling and sore throat.   Respiratory: Negative for shortness of breath.   Cardiovascular: Negative for chest pain.  Gastrointestinal: Positive for nausea and vomiting. Negative for abdominal pain, blood in stool and diarrhea.  Genitourinary: Negative for dysuria.  Musculoskeletal: Negative for back pain.  Skin: Negative for rash and wound.  Neurological: Positive for dizziness and light-headedness. Negative for headaches.  Psychiatric/Behavioral: The patient is not nervous/anxious.      Physical Exam Updated Vital Signs BP (!) 135/92   Pulse 87   Temp 97.8 F (36.6 C) (Oral)   Resp 18   SpO2 99%   Physical Exam  Constitutional: He appears well-developed and well-nourished. No distress.  HENT:  Head: Normocephalic and atraumatic.  Mouth/Throat: Oropharynx is clear and moist. No oropharyngeal exudate.  Eyes: Conjunctivae are normal. Pupils are equal, round, and reactive to light. Right eye exhibits no discharge. Left eye exhibits no discharge. No  scleral icterus.  Neck: Normal range of motion. Neck supple. No thyromegaly present.  Cardiovascular: Normal rate, regular rhythm, normal heart sounds and intact distal pulses. Exam reveals no gallop and no friction rub.  No murmur heard. Pulmonary/Chest: Effort normal and breath sounds normal. No stridor. No respiratory distress. He has no wheezes. He has no rales.  Abdominal: Soft. Bowel sounds are normal. He exhibits no distension. There is no tenderness. There is no rebound and no guarding.  Patient vomiting dark, red tinged emesis  Musculoskeletal: He exhibits no edema.    Lymphadenopathy:    He has no cervical adenopathy.  Neurological: He is alert. Coordination normal.  Skin: Skin is warm and dry. No rash noted. He is not diaphoretic. No pallor.  Psychiatric: He has a normal mood and affect.  Nursing note and vitals reviewed.    ED Treatments / Results  Labs (all labs ordered are listed, but only abnormal results are displayed) Labs Reviewed  COMPREHENSIVE METABOLIC PANEL - Abnormal; Notable for the following components:      Result Value   Glucose, Bld 117 (*)    Calcium 8.8 (*)    GFR calc non Af Amer 55 (*)    All other components within normal limits  CBC - Abnormal; Notable for the following components:   RBC 3.98 (*)    Hemoglobin 12.1 (*)    HCT 36.0 (*)    Platelets 112 (*)    All other components within normal limits  URINALYSIS, ROUTINE W REFLEX MICROSCOPIC - Abnormal; Notable for the following components:   Color, Urine STRAW (*)    Hgb urine dipstick MODERATE (*)    Leukocytes, UA TRACE (*)    All other components within normal limits  HEMOGLOBIN AND HEMATOCRIT, BLOOD - Abnormal; Notable for the following components:   Hemoglobin 10.9 (*)    HCT 33.0 (*)    All other components within normal limits  LIPASE, BLOOD  TROPONIN I  PROTIME-INR  CBC  BASIC METABOLIC PANEL  POC OCCULT BLOOD, ED  TYPE AND SCREEN  ABO/RH    EKG  EKG Interpretation  Date/Time:  Sunday November 17 2017 17:10:16 EST Ventricular Rate:  85 PR Interval:    QRS Duration: 99 QT Interval:  366 QTC Calculation: 436 R Axis:   31 Text Interpretation:  Sinus rhythm Prolonged PR interval Probable left ventricular hypertrophy Anterior ST elevation, probably due to LVH ST more prominent than prior ECG Confirmed by Alvira MondaySchlossman, Erin ([id number]) on 11/17/2017 7:00:34 PM       Radiology Dg Chest Port 1 View  Result Date: 11/17/2017 CLINICAL DATA:  Patient with hematemesis. EXAM: PORTABLE CHEST 1 VIEW COMPARISON:  Chest radiograph 04/29/2015 FINDINGS:  Monitoring leads overlie the patient. Stable enlarged cardiac and mediastinal contours. Tortuosity of the thoracic aorta. No consolidative pulmonary opacities. Bilateral perihilar interstitial pulmonary opacities. No pleural effusion or pneumothorax. IMPRESSION: Perihilar interstitial opacities may represent edema or atypical infection. Cardiomegaly. Electronically Signed   By: Annia Beltrew  Davis M.D.   On: 11/17/2017 20:56    Procedures Procedures (including critical care time)  Medications Ordered in ED Medications  ondansetron (ZOFRAN) injection 4 mg (not administered)  pantoprazole (PROTONIX) 80 mg in sodium chloride 0.9 % 250 mL (0.32 mg/mL) infusion (8 mg/hr Intravenous Transfusing/Transfer 11/17/17 2107)  pantoprazole (PROTONIX) injection 40 mg (not administered)  0.9 %  sodium chloride infusion ( Intravenous Transfusing/Transfer 11/17/17 2107)  ondansetron (ZOFRAN) tablet 4 mg (not administered)    Or  ondansetron (ZOFRAN) injection 4 mg (  not administered)  acetaminophen (TYLENOL) tablet 650 mg (not administered)    Or  acetaminophen (TYLENOL) suppository 650 mg (not administered)  albuterol (PROVENTIL) (2.5 MG/3ML) 0.083% nebulizer solution 2.5 mg (not administered)  0.9 %  sodium chloride infusion ( Intravenous Stopped 11/17/17 1817)  pantoprazole (PROTONIX) 80 mg in sodium chloride 0.9 % 100 mL IVPB (0 mg Intravenous Stopped 11/17/17 2024)     Initial Impression / Assessment and Plan / ED Course  I have reviewed the triage vital signs and the nursing notes.  Pertinent labs & imaging results that were available during my care of the patient were reviewed by me and considered in my medical decision making (see chart for details).     Patient with upper GI bleed, confirmed emesis with Hemoccult.  Initial hemoglobin 12.1.  BUN, liver function tests within normal limits.  Troponin negative.  No abdominal tenderness.  IV fluids initiated, as well as Zofran and Protonix.  I consulted  gastroenterology and spoke with Dr. Ewing SchleinMagod who advised NPO status and will see the patient in the morning with anticipated endoscopy. Dr. Ewing SchleinMagod advised to make him aware if the patient begins to have more hematemesis.  He would like hospitalist to admit.  I consulted Dr. Katrinka BlazingSmith with [facility] who will admit the patient for further evaluation and treatment. I discussed patient case with Dr. Dalene SeltzerSchlossman who guided the patient's management and agrees with plan.  Patient vitals stable throughout ED course.   Final Clinical Impressions(s) / ED Diagnoses   Final diagnoses:  Hematemesis with nausea    ED Discharge Orders    None       Emi HolesLaw, Zarianna Dicarlo M, PA-C 11/17/17 2144    Alvira MondaySchlossman, Erin, MD 11/18/17 1515

## 2017-11-18 ENCOUNTER — Encounter (HOSPITAL_COMMUNITY)
Admission: EM | Disposition: A | Discharge status: Home / Self Care | Payer: Self-pay | Source: Home / Self Care | Attending: Emergency Medicine

## 2017-11-18 ENCOUNTER — Encounter (HOSPITAL_COMMUNITY): Payer: Self-pay

## 2017-11-18 ENCOUNTER — Other Ambulatory Visit: Payer: Self-pay

## 2017-11-18 ENCOUNTER — Observation Stay (HOSPITAL_COMMUNITY): Payer: Medicaid Other | Admitting: Anesthesiology

## 2017-11-18 DIAGNOSIS — K2950 Unspecified chronic gastritis without bleeding: Secondary | ICD-10-CM | POA: Diagnosis not present

## 2017-11-18 DIAGNOSIS — I998 Other disorder of circulatory system: Secondary | ICD-10-CM

## 2017-11-18 DIAGNOSIS — K920 Hematemesis: Secondary | ICD-10-CM

## 2017-11-18 DIAGNOSIS — K210 Gastro-esophageal reflux disease with esophagitis: Secondary | ICD-10-CM | POA: Diagnosis not present

## 2017-11-18 DIAGNOSIS — D649 Anemia, unspecified: Secondary | ICD-10-CM

## 2017-11-18 DIAGNOSIS — N189 Chronic kidney disease, unspecified: Secondary | ICD-10-CM | POA: Diagnosis present

## 2017-11-18 DIAGNOSIS — N183 Chronic kidney disease, stage 3 (moderate): Secondary | ICD-10-CM

## 2017-11-18 DIAGNOSIS — D631 Anemia in chronic kidney disease: Secondary | ICD-10-CM | POA: Insufficient documentation

## 2017-11-18 DIAGNOSIS — K449 Diaphragmatic hernia without obstruction or gangrene: Secondary | ICD-10-CM | POA: Diagnosis not present

## 2017-11-18 HISTORY — PX: ESOPHAGOGASTRODUODENOSCOPY (EGD) WITH PROPOFOL: SHX5813

## 2017-11-18 LAB — BASIC METABOLIC PANEL
Anion gap: 4 — ABNORMAL LOW (ref 5–15)
BUN: 13 mg/dL (ref 6–20)
CHLORIDE: 111 mmol/L (ref 101–111)
CO2: 24 mmol/L (ref 22–32)
Calcium: 8.2 mg/dL — ABNORMAL LOW (ref 8.9–10.3)
Creatinine, Ser: 1.14 mg/dL (ref 0.61–1.24)
GFR calc non Af Amer: 56 mL/min — ABNORMAL LOW (ref >60)
Glucose, Bld: 81 mg/dL (ref 65–99)
Potassium: 4.1 mmol/L (ref 3.5–5.1)
SODIUM: 139 mmol/L (ref 135–145)

## 2017-11-18 LAB — CBC
HCT: 31.8 % — ABNORMAL LOW (ref 39.0–52.0)
HEMOGLOBIN: 10.5 g/dL — AB (ref 13.0–17.0)
MCH: 29.8 pg (ref 26.0–34.0)
MCHC: 33 g/dL (ref 30.0–36.0)
MCV: 90.3 fL (ref 78.0–100.0)
PLATELETS: 116 10*3/uL — AB (ref 150–400)
RBC: 3.52 MIL/uL — ABNORMAL LOW (ref 4.22–5.81)
RDW: 13.6 % (ref 11.5–15.5)
WBC: 4.1 10*3/uL (ref 4.0–10.5)

## 2017-11-18 SURGERY — ESOPHAGOGASTRODUODENOSCOPY (EGD) WITH PROPOFOL
Anesthesia: Monitor Anesthesia Care

## 2017-11-18 MED ORDER — PANTOPRAZOLE SODIUM 40 MG PO TBEC
40.0000 mg | DELAYED_RELEASE_TABLET | Freq: Every day | ORAL | Status: DC
  Administered 2017-11-18: 40 mg via ORAL
  Filled 2017-11-18: qty 1

## 2017-11-18 MED ORDER — ENSURE ENLIVE PO LIQD
237.0000 mL | Freq: Three times a day (TID) | ORAL | Status: DC
  Administered 2017-11-18: 237 mL via ORAL
  Filled 2017-11-18: qty 237

## 2017-11-18 MED ORDER — GLYCOPYRROLATE 0.2 MG/ML IV SOSY
PREFILLED_SYRINGE | INTRAVENOUS | Status: DC | PRN
  Administered 2017-11-18: .2 mg via INTRAVENOUS

## 2017-11-18 MED ORDER — PROPOFOL 10 MG/ML IV BOLUS
INTRAVENOUS | Status: DC | PRN
  Administered 2017-11-18 (×2): 40 mg via INTRAVENOUS
  Administered 2017-11-18: 20 mg via INTRAVENOUS

## 2017-11-18 MED ORDER — SODIUM CHLORIDE 0.9 % IV SOLN: INTRAVENOUS | Status: DC

## 2017-11-18 MED ORDER — ENSURE ENLIVE PO LIQD: 237.0000 mL | Freq: Three times a day (TID) | ORAL | 12 refills | Status: DC

## 2017-11-18 MED ORDER — PANTOPRAZOLE SODIUM 40 MG PO TBEC
40.0000 mg | DELAYED_RELEASE_TABLET | Freq: Every day | ORAL | 1 refills | Status: DC
Start: 2017-11-19 — End: 2018-01-11

## 2017-11-18 MED ORDER — PROPOFOL 500 MG/50ML IV EMUL
INTRAVENOUS | Status: DC | PRN
  Administered 2017-11-18: 50 ug/kg/min via INTRAVENOUS

## 2017-11-18 SURGICAL SUPPLY — 14 items

## 2017-11-18 NOTE — Progress Notes (Signed)
Reviewed discharge information with grandson.

## 2017-11-18 NOTE — Progress Notes (Signed)
Patient taken via bed transport to Endo for Upper GI.  Patient is non-engish speaking.  This information was given to Endo staff that called for hand-off report.  She stated they will use interpreting service in their department to obtain consent.

## 2017-11-18 NOTE — Progress Notes (Signed)
Patient complained of headache this AM. Tylenol given. Patient vomited once when he arrived. Since than no vomiting or any signs of bleeding. Hemoglobin 10.5 this morning.  Will contine to monitor.  Tesha Archambeau, RN

## 2017-11-18 NOTE — Progress Notes (Signed)
Nepali interpreter used for admission with rn [phone] Deo.    Nepali interpreter used for gastroenterologist and anesthesiologist [phone] [facility].  Patient not able to sign consent.  Procedure explained by MD.  Consent signed by two RNs.

## 2017-11-18 NOTE — Progress Notes (Signed)
Initial Nutrition Assessment  DOCUMENTATION CODES:   Underweight  INTERVENTION:   -Ensure Enlive po TID, each supplement provides 350 kcal and 20 grams of protein  NUTRITION DIAGNOSIS:   Predicted suboptimal nutrient intake related to acute illness as evidenced by (underweight status).  GOAL:   Patient will meet greater than or equal to 90% of their needs  MONITOR:   PO intake, Supplement acceptance, Labs, Weight trends, Skin, I & O's  REASON FOR ASSESSMENT:   Malnutrition Screening Tool    ASSESSMENT:   Martin Tanner is a 83 y.o. [place]ali speaking male with medical history significant of BPH s/p transurethral resection of the prostate; presents with complaints of nausea and vomiting since around 2 PM this afternoon.  11/26- s/p upper endo, which revealed small hiatal hernia, mild to moderate reflux esophagitis, and chronic hiatal hernia  Attempted to speak to examine pt x 2, however, pt either asleep or in with multiple staff members at times of visits.   Reviewed wt hx; wt ranging between 96-103#. No recent wt hx to assess wt changes.   Given nausea and vomiting, suspect intake was poor PTA. Due to pt's small frame and underweight status, pt would likely benefit from supplements which RD will order. Per congregational nurse visit in 07/2017, pt is not food insecure. Suspect pt may have some degree of malnutrition, however, unable to confirm at this time.  Labs reviewed.   Diet Order:  DIET SOFT Room service appropriate? Yes; Fluid consistency: Thin Diet general  EDUCATION NEEDS:   Education needs have been addressed  Skin:  Skin Assessment: Reviewed RN Assessment  Last BM:  11/16/17  Height:   Ht Readings from Last 1 Encounters:  11/17/17 5\' 2"  (1.575 m)    Weight:   Wt Readings from Last 1 Encounters:  11/18/17 96 lb (43.5 kg)    Ideal Body Weight:  53.6 kg  BMI:  Body mass index is 17.56 kg/m.  Estimated Nutritional Needs:   Kcal:   1350-1550  Protein:  65-80 grams  Fluid:  1.3-1.5 L    Martin Tanner, RD, LDN, CDE Pager: [phone] After hours Pager: [phone]

## 2017-11-18 NOTE — Discharge Summary (Signed)
Physician Discharge Summary  Martin Tanner Salva ZOX:[id number] DOB: 08/30/1928 DOA: 11/17/2017  PCP: System, Pcp Not In  Admit date: 11/17/2017 Discharge date: 11/18/2017   Recommendations for Outpatient Follow-Up:   1. Cbc 1 week 2. PRN GI follow up   Discharge Diagnosis:   Principal Problem:   Hematemesis Active Problems:   Anemia   Thrombocytopenia (HCC)   Chronic kidney disease, stage III (moderate) (HCC)   Hemangiectasia   Discharge disposition:  Home:  Discharge Condition: Improved.  Diet recommendation:reg  Wound care: None.   History of Present Illness:   Martin Tanner is a 83 y.o. [place] speaking male with medical history significant of BPH s/p transurethral resection of the prostate; presents with complaints of nausea and vomiting since around 2 PM this afternoon.  History is obtained with the use of interpreter services and patient's son-in-law giving most of history.  Patient had been in his normal state of health prior to onset of symptoms.  Emesis was noted to be significantly dark and he is previously had dark stools in the past.  Associated symptoms include dizziness/lightheadedness.  Denies any fever, chills, shortness of breath, chest pain, abdominal pain, dysuria, blood thinners, or NSAID use. Family reports a remote history of alcohol and tobacco use, but no no use in over 5 years.  He reportedly tried some anti-nausea medicine without relief prior to arrival.     Hospital Course by Problem:   Mild hematemesis due to suspected gastritits -s/p EGD -PPI started -CBC 1 week -h/h here stable     Medical Consultants:    GI   Discharge Exam:   Vitals:   11/18/17 0958 11/18/17 1200  BP: 129/67 133/76  Pulse: 88 81  Resp: 15 18  Temp:    SpO2: 100% 100%   Vitals:   11/18/17 0949 11/18/17 0955 11/18/17 0958 11/18/17 1200  BP:  (!) 108/54 129/67 133/76  Pulse: 94 88 88 81  Resp: 15 15 15 18   Temp:  98.8 F (37.1 C)    TempSrc:   Axillary    SpO2: 99% 99% 100% 100%  Weight:      Height:        Gen:  NAD    The results of significant diagnostics from this hospitalization (including imaging, microbiology, ancillary and laboratory) are listed below for reference.     Procedures and Diagnostic Studies:   Dg Chest Port 1 View  Result Date: 11/17/2017 CLINICAL DATA:  Patient with hematemesis. EXAM: PORTABLE CHEST 1 VIEW COMPARISON:  Chest radiograph 04/29/2015 FINDINGS: Monitoring leads overlie the patient. Stable enlarged cardiac and mediastinal contours. Tortuosity of the thoracic aorta. No consolidative pulmonary opacities. Bilateral perihilar interstitial pulmonary opacities. No pleural effusion or pneumothorax. IMPRESSION: Perihilar interstitial opacities may represent edema or atypical infection. Cardiomegaly. Electronically Signed   By: Annia Beltrew  Davis M.D.   On: 11/17/2017 20:56     Labs:   Basic Metabolic Panel: Recent Labs  Lab 11/17/17 1656 11/18/17 0426  NA 138 139  K 4.1 4.1  CL 107 111  CO2 23 24  GLUCOSE 117* 81  BUN 14 13  CREATININE 1.16 1.14  CALCIUM 8.8* 8.2*   GFR Estimated Creatinine Clearance: 28.1 mL/min (by C-G formula based on SCr of 1.14 mg/dL). Liver Function Tests: Recent Labs  Lab 11/17/17 1656  AST 31  ALT 17  ALKPHOS 85  BILITOT 0.7  PROT 7.5  ALBUMIN 3.6   Recent Labs  Lab 11/17/17 1656  LIPASE 29   No  results for input(s): AMMONIA in the last 168 hours. Coagulation profile Recent Labs  Lab 11/17/17 1656  INR 1.10    CBC: Recent Labs  Lab 11/17/17 1656 11/17/17 2051 11/18/17 0426  WBC 6.3  --  4.1  HGB 12.1* 10.9* 10.5*  HCT 36.0* 33.0* 31.8*  MCV 90.5  --  90.3  PLT 112*  --  116*   Cardiac Enzymes: Recent Labs  Lab 11/17/17 1909  TROPONINI <0.03   BNP: Invalid input(s): POCBNP CBG: No results for input(s): GLUCAP in the last 168 hours. D-Dimer No results for input(s): DDIMER in the last 72 hours. Hgb A1c No results for input(s):  HGBA1C in the last 72 hours. Lipid Profile No results for input(s): CHOL, HDL, LDLCALC, TRIG, CHOLHDL, LDLDIRECT in the last 72 hours. Thyroid function studies No results for input(s): TSH, T4TOTAL, T3FREE, THYROIDAB in the last 72 hours.  Invalid input(s): FREET3 Anemia work up No results for input(s): VITAMINB12, FOLATE, FERRITIN, TIBC, IRON, RETICCTPCT in the last 72 hours. Microbiology No results found for this or any previous visit (from the past 240 hour(s)).   Discharge Instructions:   Discharge Instructions    Diet general   Complete by:  As directed    Discharge instructions   Complete by:  As directed    Will need repeat CBC in 1-2 weeks Return to ER with any signs of bleeding   Increase activity slowly   Complete by:  As directed      Allergies as of 11/18/2017   No Known Allergies     Medication List    TAKE these medications   feeding supplement (ENSURE ENLIVE) Liqd Take 237 mLs by mouth 3 (three) times daily between meals.   pantoprazole 40 MG tablet Commonly known as:  PROTONIX Take 1 tablet (40 mg total) by mouth daily. Start taking on:  11/19/2017      Follow-up Information    Vida RiggerMagod, Marc, MD Follow up.   Specialty:  Gastroenterology Why:  as neededOffice will call patient Contact information: [address] [place] [address] [phone]        [facility]. Go on 12/05/2017.   Why:  @8 :30am Contact information: [address] [place] [phone]           Time coordinating discharge: 25 min  Signed:  Joseph ArtJessica U Evangelina Delancey   [facility] 11/18/2017, 4:46 PM

## 2017-11-18 NOTE — Consult Note (Signed)
Reason for Consult: Hematemesis Referring Physician: Hospital team  Martin Tanner is an 83 y.o. male.  HPI: Patient seen and examined and discussed via video translator and his hospital computer chart was reviewed and his case discussed with the ER team last night and he has had no further upper GI bleeding since last night and he has not had this before and he does not have any pain and he is on an aspirin a day but no other blood thinners or nonsteroidals and he used to smoke and drink more than he has recently and currently he has no complaints  Past Medical History:  Diagnosis Date  . BPH (benign prostatic hyperplasia)   . Upper GI bleed 11/17/2017    Past Surgical History:  Procedure Laterality Date  . None    . TRANSURETHRAL RESECTION OF PROSTATE N/A 03/16/2013   Procedure: TRANSURETHRAL RESECTION OF THE PROSTATE WITH GYRUS INSTRUMENTS;  Surgeon: Franchot Gallo, MD;  Location: [facility];  Service: Urology;  Laterality: N/A;    Family History  Problem Relation Age of Onset  . Other Unknown        Negative CAD  . Other Unknown        Negative DM2  . Other Unknown        Negative HTN  . Other Unknown        Negative Cancer    Social History:  reports that he has been smoking cigarettes.  He has a 15.50 pack-year smoking history. he has never used smokeless tobacco. He reports that he does not drink alcohol or use drugs.  Allergies: No Known Allergies  Medications: I have reviewed the patient's current medications.  Results for orders placed or performed during the hospital encounter of 11/17/17 (from the past 48 hour(s))  Lipase, blood     Status: None   Collection Time: 11/17/17  4:56 PM  Result Value Ref Range   Lipase 29 11 - 51 U/L  Comprehensive metabolic panel     Status: Abnormal   Collection Time: 11/17/17  4:56 PM  Result Value Ref Range   Sodium 138 135 - 145 mmol/L   Potassium 4.1 3.5 - 5.1 mmol/L   Chloride 107 101 - 111 mmol/L   CO2 23 22 - 32 mmol/L    Glucose, Bld 117 (H) 65 - 99 mg/dL   BUN 14 6 - 20 mg/dL   Creatinine, Ser 1.16 0.61 - 1.24 mg/dL   Calcium 8.8 (L) 8.9 - 10.3 mg/dL   Total Protein 7.5 6.5 - 8.1 g/dL   Albumin 3.6 3.5 - 5.0 g/dL   AST 31 15 - 41 U/L   ALT 17 17 - 63 U/L   Alkaline Phosphatase 85 38 - 126 U/L   Total Bilirubin 0.7 0.3 - 1.2 mg/dL   GFR calc non Af Amer 55 (L) >60 mL/min   GFR calc Af Amer >60 >60 mL/min    Comment: (NOTE) The eGFR has been calculated using the CKD EPI equation. This calculation has not been validated in all clinical situations. eGFR's persistently <60 mL/min signify possible Chronic Kidney Disease.    Anion gap 8 5 - 15  CBC     Status: Abnormal   Collection Time: 11/17/17  4:56 PM  Result Value Ref Range   WBC 6.3 4.0 - 10.5 K/uL   RBC 3.98 (L) 4.22 - 5.81 MIL/uL   Hemoglobin 12.1 (L) 13.0 - 17.0 g/dL   HCT 36.0 (L) 39.0 - 52.0 %  MCV 90.5 78.0 - 100.0 fL   MCH 30.4 26.0 - 34.0 pg   MCHC 33.6 30.0 - 36.0 g/dL   RDW 13.3 11.5 - 15.5 %   Platelets 112 (L) 150 - 400 K/uL    Comment: REPEATED TO VERIFY SPECIMEN CHECKED FOR CLOTS PLATELET COUNT CONFIRMED BY SMEAR   Protime-INR     Status: None   Collection Time: 11/17/17  4:56 PM  Result Value Ref Range   Prothrombin Time 14.1 11.4 - 15.2 seconds   INR 1.10   Type and screen [place]     Status: None   Collection Time: 11/17/17  5:04 PM  Result Value Ref Range   ABO/RH(D) O POS    Antibody Screen NEG    Sample Expiration 11/20/2017   ABO/Rh     Status: None   Collection Time: 11/17/17  5:04 PM  Result Value Ref Range   ABO/RH(D) O POS   POC occult blood, ED Provider will collect     Status: None   Collection Time: 11/17/17  6:21 PM  Result Value Ref Range   Fecal Occult Bld NEGATIVE NEGATIVE  Urinalysis, Routine w reflex microscopic     Status: Abnormal   Collection Time: 11/17/17  6:50 PM  Result Value Ref Range   Color, Urine STRAW (A) YELLOW   APPearance CLEAR CLEAR   Specific  Gravity, Urine 1.010 1.005 - 1.030   pH 6.0 5.0 - 8.0   Glucose, UA NEGATIVE NEGATIVE mg/dL   Hgb urine dipstick MODERATE (A) NEGATIVE   Bilirubin Urine NEGATIVE NEGATIVE   Ketones, ur NEGATIVE NEGATIVE mg/dL   Protein, ur NEGATIVE NEGATIVE mg/dL   Nitrite NEGATIVE NEGATIVE   Leukocytes, UA TRACE (A) NEGATIVE   RBC / HPF NONE SEEN 0 - 5 RBC/hpf   WBC, UA 0-5 0 - 5 WBC/hpf   Bacteria, UA NONE SEEN NONE SEEN   Squamous Epithelial / LPF NONE SEEN NONE SEEN  Troponin I     Status: None   Collection Time: 11/17/17  7:09 PM  Result Value Ref Range   Troponin I <0.03 <0.03 ng/mL  Hemoglobin and hematocrit, blood     Status: Abnormal   Collection Time: 11/17/17  8:51 PM  Result Value Ref Range   Hemoglobin 10.9 (L) 13.0 - 17.0 g/dL   HCT 33.0 (L) 39.0 - 52.0 %  CBC     Status: Abnormal   Collection Time: 11/18/17  4:26 AM  Result Value Ref Range   WBC 4.1 4.0 - 10.5 K/uL   RBC 3.52 (L) 4.22 - 5.81 MIL/uL   Hemoglobin 10.5 (L) 13.0 - 17.0 g/dL   HCT 31.8 (L) 39.0 - 52.0 %   MCV 90.3 78.0 - 100.0 fL   MCH 29.8 26.0 - 34.0 pg   MCHC 33.0 30.0 - 36.0 g/dL   RDW 13.6 11.5 - 15.5 %   Platelets 116 (L) 150 - 400 K/uL    Comment: CONSISTENT WITH PREVIOUS RESULT  Basic metabolic panel     Status: Abnormal   Collection Time: 11/18/17  4:26 AM  Result Value Ref Range   Sodium 139 135 - 145 mmol/L   Potassium 4.1 3.5 - 5.1 mmol/L   Chloride 111 101 - 111 mmol/L   CO2 24 22 - 32 mmol/L   Glucose, Bld 81 65 - 99 mg/dL   BUN 13 6 - 20 mg/dL   Creatinine, Ser 1.14 0.61 - 1.24 mg/dL   Calcium 8.2 (L) 8.9 -  10.3 mg/dL   GFR calc non Af Amer 56 (L) >60 mL/min   GFR calc Af Amer >60 >60 mL/min    Comment: (NOTE) The eGFR has been calculated using the CKD EPI equation. This calculation has not been validated in all clinical situations. eGFR's persistently <60 mL/min signify possible Chronic Kidney Disease.    Anion gap 4 (L) 5 - 15    Dg Chest Port 1 View  Result Date:  11/17/2017 CLINICAL DATA:  Patient with hematemesis. EXAM: PORTABLE CHEST 1 VIEW COMPARISON:  Chest radiograph 04/29/2015 FINDINGS: Monitoring leads overlie the patient. Stable enlarged cardiac and mediastinal contours. Tortuosity of the thoracic aorta. No consolidative pulmonary opacities. Bilateral perihilar interstitial pulmonary opacities. No pleural effusion or pneumothorax. IMPRESSION: Perihilar interstitial opacities may represent edema or atypical infection. Cardiomegaly. Electronically Signed   By: Lovey Newcomer M.D.   On: 11/17/2017 20:56    ROS negative except above Blood pressure (!) 141/85, pulse 80, temperature 98.1 F (36.7 C), temperature source Oral, resp. rate 16, height _0  (1.575 m), weight 43.5 kg (96 lb), SpO2 99 %. Physical Exam vital signs stable afebrile no acute distress lying comfortably in the bed exam please see preassessment evaluation previous CT reviewed from 2014 of note he does have gallstones but normal-appearing liver and spleen labs reviewed stable hemoglobin normal BUN chronic low platelet count normal liver tests  Assessment/Plan: Seemingly self-limited hematemesis Plan: The risks benefits methods of endoscopy was discussed via the translator as above and will proceed with anesthesia assistance with further workup and plans pending those findings  [place] E 11/18/2017, 9:16 AM

## 2017-11-18 NOTE — Anesthesia Procedure Notes (Signed)
Procedure Name: MAC Date/Time: 11/18/2017 9:29 AM Performed by: Imagene Riches, CRNA Pre-anesthesia Checklist: Patient identified, Emergency Drugs available, Suction available and Patient being monitored Patient Re-evaluated:Patient Re-evaluated prior to induction Oxygen Delivery Method: Nasal cannula Induction Type: IV induction

## 2017-11-18 NOTE — Progress Notes (Signed)
New Admission Note:   Arrival Method: from ED Mental Orientation: Alert and oriented x 4  Telemetry:3e box 26 Assessment: Completed Skin: Intact Pain:0/10 Tubes: None Safety Measures: Safety Fall Prevention Plan has been discussed  Admission:  [facility] Orientation: Patient has been orientated to the room, unit and staff.  Family: at bedside  Orders to be reviewed and implemented. Will continue to monitor the patient. Call light has been placed within reach and bed alarm has been activated.   Bettey MareJasmina Sang Blount, RN Phone: [phone]

## 2017-11-18 NOTE — Op Note (Signed)
[facility] Patient Name: Martin Tanner Procedure Date : 11/18/2017 MRN: [id number] Attending MD: Vida RiggerMarc Ayrianna Mcginniss , MD Date of Birth: 11/29/1928 CSN: [id number] Age: 83 Admit Type: Inpatient Procedure:                Upper GI endoscopy Indications:              Hematemesis Providers:                Vida RiggerMarc Rubee Vega, MD, Dwain SarnaPatricia Ford, RN, Kandice RobinsonsGuillaume Awaka,                            Technician Referring MD:              Medicines:                Propofol total dose 200 mg IV,0.2 milligrams                            glycopyrrolate Complications:            No immediate complications. Estimated Blood Loss:     Estimated blood loss: none. Procedure:                Pre-Anesthesia Assessment:                           - Prior to the procedure, a History and Physical                            was performed, and patient medications and                            allergies were reviewed. The patient's tolerance of                            previous anesthesia was also reviewed. The risks                            and benefits of the procedure and the sedation                            options and risks were discussed with the patient.                            All questions were answered, and informed consent                            was obtained. Prior Anticoagulants: The patient has                            taken aspirin, last dose was 1 day prior to                            procedure. ASA Grade Assessment: II - A patient                            with  mild systemic disease. After reviewing the                            risks and benefits, the patient was deemed in                            satisfactory condition to undergo the procedure.                           After obtaining informed consent, the endoscope was                            passed under direct vision. Throughout the                            procedure, the patient's blood pressure, pulse, and             oxygen saturations were monitored continuously. The                            was introduced through the mouth, and advanced to                            the second part of duodenum. The upper GI endoscopy                            was accomplished without difficulty. The patient                            tolerated the procedure well. Scope In: Scope Out: Findings:      The larynx was normal.      Mild-to-moderate esophagitis with no bleeding was found.      A small hiatal hernia was present.      Localized mild inflammation characterized by linear erosions was found       in the cardia. compatible with mild Cameron erosions      The duodenal bulb, first portion of the duodenum and second portion of       the duodenum were normal.      The exam was otherwise without abnormality. Impression:               - Normal larynx.                           - Mild-to-moderate reflux esophagitis.                           - Small hiatal hernia.                           - Chronic gastritis.                           - Normal duodenal bulb, first portion of the                            duodenum and  second portion of the duodenum.                           - The examination was otherwise normal.                           - No specimens collected. Moderate Sedation:      moderate sedation-none Recommendation:           - Patient has a contact number available for                            emergencies. The signs and symptoms of potential                            delayed complications were discussed with the                            patient. Return to normal activities tomorrow.                            Written discharge instructions were provided to the                            patient.                           - Soft diet today. and care with aspirin and                            nonsteroidals                           - Use Protonix (pantoprazole) 40 mg PO daily                             indefinitely.                           - Return to GI clinic PRN.                           - Telephone GI clinic if symptomatic PRN. hopefully                            can go home soon if no signs of further bleedingand                            follow-up with his primary care physician n a few                            weeks to recheck lab work and make sure no further                            workup and plans are needed                           -  Continue present medications. Procedure Code(s):        --- Professional ---                           [phone], Esophagogastroduodenoscopy, flexible,                            transoral; diagnostic, including collection of                            specimen(s) by brushing or washing, when performed                            (separate procedure) Diagnosis Code(s):        --- Professional ---                           K21.0, Gastro-esophageal reflux disease with                            esophagitis                           K44.9, Diaphragmatic hernia without obstruction or                            gangrene                           K29.50, Unspecified chronic gastritis without                            bleeding                           K92.0, Hematemesis CPT copyright 2016 American Medical Association. All rights reserved. The codes documented in this report are preliminary and upon coder review may  be revised to meet current compliance requirements. Vida Rigger, MD 11/18/2017 9:51:09 AM This report has been signed electronically. Number of Addenda: 0

## 2017-11-18 NOTE — Progress Notes (Signed)
Patient returned from Endoscopy.  Report from RN stated Dr. Ewing SchleinMagod found esophagitis, gastritis, and small hiatal hernia and treatment plan is to advance his diet to soft food and protonix tablet daily.  IV Protonix discontinued.  Per [employer] interpreting service, above report information given to patient and his visitor at bedside.  Head to toe assessment completed via interpreter.  Patient and visitor updated on plan of care.

## 2017-11-18 NOTE — Transfer of Care (Signed)
Immediate Anesthesia Transfer of Care Note  Patient: Martin Tanner  Procedure(s) Performed: ESOPHAGOGASTRODUODENOSCOPY (EGD) WITH PROPOFOL (N/A )  Patient Location: PACU  Anesthesia Type:MAC  Level of Consciousness: drowsy  Airway & Oxygen Therapy: Patient Spontanous Breathing  Post-op Assessment: Report given to RN and Post -op Vital signs reviewed and stable  Post vital signs: Reviewed and stable  Last Vitals:  Vitals:   11/18/17 0910 11/18/17 0949  BP: (!) 141/85   Pulse: 80 94  Resp: 16 15  Temp: 36.7 C   SpO2: 99% 99%    Last Pain:  Vitals:   11/18/17 0910  TempSrc: Oral      Patients Stated Pain Goal: 0 (90/30/09 2330)  Complications: No apparent anesthesia complications

## 2017-11-18 NOTE — Anesthesia Postprocedure Evaluation (Signed)
Anesthesia Post Note  Patient: [place] Bahadur Raine  Procedure(s) Performed: ESOPHAGOGASTRODUODENOSCOPY (EGD) WITH PROPOFOL (N/A )     Patient location during evaluation: PACU Anesthesia Type: MAC Level of consciousness: awake and alert Pain management: pain level controlled Vital Signs Assessment: post-procedure vital signs reviewed and stable Respiratory status: spontaneous breathing, nonlabored ventilation and respiratory function stable Cardiovascular status: stable and blood pressure returned to baseline Anesthetic complications: no    Last Vitals:  Vitals:   11/18/17 0955 11/18/17 0958  BP: (!) 108/54 129/67  Pulse: 88 88  Resp: 15 15  Temp: 37.1 C   SpO2: 99% 100%    Last Pain:  Vitals:   11/18/17 1045  TempSrc:   PainSc: 0-No pain                 Audry Pili

## 2017-11-18 NOTE — Progress Notes (Signed)
Admission history obtained from patient via interpreter and family members at bedside.  Nancye Grumbine, RN

## 2017-11-18 NOTE — Progress Notes (Signed)
Used [employer] interpreter service and reviewed discharge instructions with pt and pt's family member. Answered all of their questions and reviewed medications and appointments. Pt is stable and ready for discharge. Waiting on grandson to pick him up.

## 2017-11-18 NOTE — Anesthesia Preprocedure Evaluation (Addendum)
Anesthesia Evaluation  Patient identified by MRN, date of birth, ID band Patient awake    Reviewed: Allergy & Precautions, H&P , NPO status , Patient's Chart, lab work & pertinent test results  Airway Mallampati: II  TM Distance: >3 FB Neck ROM: full    Dental  (+) Dental Advisory Given   Pulmonary neg pulmonary ROS, Current Smoker,    breath sounds clear to auscultation       Cardiovascular negative cardio ROS   Rhythm:regular Rate:Normal     Neuro/Psych negative neurological ROS  negative psych ROS   GI/Hepatic negative GI ROS, Neg liver ROS, UGIB   Endo/Other  negative endocrine ROS  Renal/GU Renal InsufficiencyRenal disease   BPH    Musculoskeletal   Abdominal   Peds  Hematology  (+) anemia , Thrombocytopenia   Anesthesia Other Findings    Reproductive/Obstetrics negative OB ROS                             Anesthesia Physical  Anesthesia Plan  ASA: II  Anesthesia Plan: MAC   Post-op Pain Management:    Induction: Intravenous  PONV Risk Score and Plan: Propofol infusion and Treatment may vary due to age or medical condition  Airway Management Planned: Natural Airway and Nasal Cannula  Additional Equipment: None  Intra-op Plan:   Post-operative Plan:   Informed Consent: I have reviewed the patients History and Physical, chart, labs and discussed the procedure including the risks, benefits and alternatives for the proposed anesthesia with the patient or authorized representative who has indicated his/her understanding and acceptance.   Dental Advisory Given  Plan Discussed with: CRNA  Anesthesia Plan Comments:         Anesthesia Quick Evaluation

## 2017-12-05 ENCOUNTER — Inpatient Hospital Stay: Payer: Medicaid Other

## 2018-01-09 ENCOUNTER — Emergency Department (HOSPITAL_COMMUNITY): Payer: Medicaid Other

## 2018-01-09 ENCOUNTER — Encounter (HOSPITAL_COMMUNITY): Payer: Self-pay

## 2018-01-09 ENCOUNTER — Other Ambulatory Visit: Payer: Self-pay

## 2018-01-09 ENCOUNTER — Inpatient Hospital Stay (HOSPITAL_COMMUNITY)
Admission: EM | Admit: 2018-01-09 | Discharge: 2018-01-11 | DRG: 193 | Disposition: A | Discharge status: Home / Self Care | Payer: Medicaid Other | Attending: Nephrology | Admitting: Nephrology

## 2018-01-09 DIAGNOSIS — R109 Unspecified abdominal pain: Secondary | ICD-10-CM | POA: Diagnosis present

## 2018-01-09 DIAGNOSIS — E860 Dehydration: Secondary | ICD-10-CM | POA: Diagnosis present

## 2018-01-09 DIAGNOSIS — D696 Thrombocytopenia, unspecified: Secondary | ICD-10-CM | POA: Diagnosis present

## 2018-01-09 DIAGNOSIS — N189 Chronic kidney disease, unspecified: Secondary | ICD-10-CM

## 2018-01-09 DIAGNOSIS — J189 Pneumonia, unspecified organism: Principal | ICD-10-CM | POA: Diagnosis present

## 2018-01-09 DIAGNOSIS — N183 Chronic kidney disease, stage 3 (moderate): Secondary | ICD-10-CM | POA: Diagnosis present

## 2018-01-09 DIAGNOSIS — F17210 Nicotine dependence, cigarettes, uncomplicated: Secondary | ICD-10-CM | POA: Diagnosis present

## 2018-01-09 DIAGNOSIS — N400 Enlarged prostate without lower urinary tract symptoms: Secondary | ICD-10-CM | POA: Diagnosis present

## 2018-01-09 DIAGNOSIS — Z79899 Other long term (current) drug therapy: Secondary | ICD-10-CM

## 2018-01-09 DIAGNOSIS — D631 Anemia in chronic kidney disease: Secondary | ICD-10-CM | POA: Diagnosis present

## 2018-01-09 DIAGNOSIS — N179 Acute kidney failure, unspecified: Secondary | ICD-10-CM | POA: Diagnosis present

## 2018-01-09 DIAGNOSIS — R42 Dizziness and giddiness: Secondary | ICD-10-CM

## 2018-01-09 DIAGNOSIS — E43 Unspecified severe protein-calorie malnutrition: Secondary | ICD-10-CM | POA: Diagnosis present

## 2018-01-09 DIAGNOSIS — R5381 Other malaise: Secondary | ICD-10-CM | POA: Diagnosis present

## 2018-01-09 DIAGNOSIS — R29700 NIHSS score 0: Secondary | ICD-10-CM | POA: Diagnosis present

## 2018-01-09 DIAGNOSIS — Y95 Nosocomial condition: Secondary | ICD-10-CM | POA: Diagnosis present

## 2018-01-09 DIAGNOSIS — Z8719 Personal history of other diseases of the digestive system: Secondary | ICD-10-CM

## 2018-01-09 DIAGNOSIS — K920 Hematemesis: Secondary | ICD-10-CM | POA: Diagnosis present

## 2018-01-09 DIAGNOSIS — Z681 Body mass index (BMI) 19 or less, adult: Secondary | ICD-10-CM

## 2018-01-09 DIAGNOSIS — H9190 Unspecified hearing loss, unspecified ear: Secondary | ICD-10-CM | POA: Diagnosis present

## 2018-01-09 DIAGNOSIS — D649 Anemia, unspecified: Secondary | ICD-10-CM | POA: Diagnosis present

## 2018-01-09 DIAGNOSIS — R112 Nausea with vomiting, unspecified: Secondary | ICD-10-CM

## 2018-01-09 HISTORY — DX: Anemia, unspecified: D64.9

## 2018-01-09 HISTORY — DX: Chronic kidney disease, unspecified: N18.9

## 2018-01-09 LAB — URINALYSIS, ROUTINE W REFLEX MICROSCOPIC
BILIRUBIN URINE: NEGATIVE
Bacteria, UA: NONE SEEN
GLUCOSE, UA: NEGATIVE mg/dL
KETONES UR: NEGATIVE mg/dL
LEUKOCYTES UA: NEGATIVE
NITRITE: NEGATIVE
Protein, ur: NEGATIVE mg/dL
Specific Gravity, Urine: 1.009 (ref 1.005–1.030)
WBC, UA: NONE SEEN WBC/hpf (ref 0–5)
pH: 7 (ref 5.0–8.0)

## 2018-01-09 LAB — COMPREHENSIVE METABOLIC PANEL
ALT: 11 U/L — ABNORMAL LOW (ref 17–63)
ANION GAP: 12 (ref 5–15)
AST: 22 U/L (ref 15–41)
Albumin: 3.4 g/dL — ABNORMAL LOW (ref 3.5–5.0)
Alkaline Phosphatase: 83 U/L (ref 38–126)
BUN: 12 mg/dL (ref 6–20)
CHLORIDE: 104 mmol/L (ref 101–111)
CO2: 22 mmol/L (ref 22–32)
Calcium: 8.5 mg/dL — ABNORMAL LOW (ref 8.9–10.3)
Creatinine, Ser: 1.26 mg/dL — ABNORMAL HIGH (ref 0.61–1.24)
GFR, EST AFRICAN AMERICAN: 57 mL/min — AB (ref >60)
GFR, EST NON AFRICAN AMERICAN: 49 mL/min — AB (ref >60)
Glucose, Bld: 95 mg/dL (ref 65–99)
POTASSIUM: 4.3 mmol/L (ref 3.5–5.1)
Sodium: 138 mmol/L (ref 135–145)
Total Bilirubin: 1.1 mg/dL (ref 0.3–1.2)
Total Protein: 7.2 g/dL (ref 6.5–8.1)

## 2018-01-09 LAB — CBC WITH DIFFERENTIAL/PLATELET
BASOS ABS: 0 10*3/uL (ref 0.0–0.1)
Basophils Relative: 0 %
EOS PCT: 3 %
Eosinophils Absolute: 0.2 10*3/uL (ref 0.0–0.7)
HCT: 34.7 % — ABNORMAL LOW (ref 39.0–52.0)
Hemoglobin: 11.3 g/dL — ABNORMAL LOW (ref 13.0–17.0)
LYMPHS ABS: 0.5 10*3/uL — AB (ref 0.7–4.0)
LYMPHS PCT: 10 %
MCH: 30.2 pg (ref 26.0–34.0)
MCHC: 32.6 g/dL (ref 30.0–36.0)
MCV: 92.8 fL (ref 78.0–100.0)
MONO ABS: 0.2 10*3/uL (ref 0.1–1.0)
Monocytes Relative: 4 %
Neutro Abs: 3.9 10*3/uL (ref 1.7–7.7)
Neutrophils Relative %: 83 %
Platelets: 92 10*3/uL — ABNORMAL LOW (ref 150–400)
RBC: 3.74 MIL/uL — ABNORMAL LOW (ref 4.22–5.81)
RDW: 13.4 % (ref 11.5–15.5)
WBC: 4.7 10*3/uL (ref 4.0–10.5)

## 2018-01-09 LAB — I-STAT CG4 LACTIC ACID, ED: Lactic Acid, Venous: 1.26 mmol/L (ref 0.5–1.9)

## 2018-01-09 LAB — LIPASE, BLOOD: Lipase: 25 U/L (ref 11–51)

## 2018-01-09 LAB — POC OCCULT BLOOD, ED: Fecal Occult Bld: NEGATIVE

## 2018-01-09 MED ORDER — SENNOSIDES-DOCUSATE SODIUM 8.6-50 MG PO TABS: 1.0000 | ORAL_TABLET | Freq: Every evening | ORAL | Status: DC | PRN

## 2018-01-09 MED ORDER — ALBUTEROL SULFATE (2.5 MG/3ML) 0.083% IN NEBU
2.5000 mg | INHALATION_SOLUTION | RESPIRATORY_TRACT | Status: DC | PRN
Start: 2018-01-09 — End: 2018-01-11

## 2018-01-09 MED ORDER — DIPHENHYDRAMINE HCL 50 MG/ML IJ SOLN
25.0000 mg | Freq: Once | INTRAMUSCULAR | Status: AC
  Administered 2018-01-09: 25 mg via INTRAVENOUS
  Filled 2018-01-09: qty 1

## 2018-01-09 MED ORDER — VANCOMYCIN HCL 500 MG IV SOLR: 500.0000 mg | INTRAVENOUS | Status: DC

## 2018-01-09 MED ORDER — MECLIZINE HCL 25 MG PO TABS
12.5000 mg | ORAL_TABLET | Freq: Once | ORAL | Status: AC
Start: 2018-01-09 — End: 2018-01-09
  Administered 2018-01-09: 12.5 mg via ORAL
  Filled 2018-01-09: qty 1

## 2018-01-09 MED ORDER — DEXTROSE 5 % IV SOLN: 2.0000 g | Freq: Once | INTRAVENOUS | Status: DC

## 2018-01-09 MED ORDER — ONDANSETRON HCL 4 MG/2ML IJ SOLN
4.0000 mg | Freq: Once | INTRAMUSCULAR | Status: AC
  Administered 2018-01-09: 4 mg via INTRAVENOUS
  Filled 2018-01-09: qty 2

## 2018-01-09 MED ORDER — CEFEPIME HCL 2 G IJ SOLR
2.0000 g | Freq: Once | INTRAMUSCULAR | Status: AC
  Administered 2018-01-09: 2 g via INTRAVENOUS
  Filled 2018-01-09: qty 2

## 2018-01-09 MED ORDER — SODIUM CHLORIDE 0.9 % IV BOLUS (SEPSIS)
1000.0000 mL | Freq: Once | INTRAVENOUS | Status: AC
  Administered 2018-01-09: 1000 mL via INTRAVENOUS

## 2018-01-09 MED ORDER — ACETAMINOPHEN 650 MG RE SUPP: 650.0000 mg | Freq: Four times a day (QID) | RECTAL | Status: DC | PRN

## 2018-01-09 MED ORDER — HYDROCODONE-ACETAMINOPHEN 5-325 MG PO TABS: 1.0000 | ORAL_TABLET | ORAL | Status: DC | PRN

## 2018-01-09 MED ORDER — BISACODYL 10 MG RE SUPP: 10.0000 mg | Freq: Every day | RECTAL | Status: DC | PRN

## 2018-01-09 MED ORDER — PANTOPRAZOLE SODIUM 40 MG PO TBEC
40.0000 mg | DELAYED_RELEASE_TABLET | Freq: Every day | ORAL | Status: DC
  Administered 2018-01-09 – 2018-01-11 (×3): 40 mg via ORAL
  Filled 2018-01-09 (×3): qty 1

## 2018-01-09 MED ORDER — GUAIFENESIN ER 600 MG PO TB12
600.0000 mg | ORAL_TABLET | Freq: Two times a day (BID) | ORAL | Status: DC | PRN
Start: 2018-01-09 — End: 2018-01-11

## 2018-01-09 MED ORDER — MECLIZINE HCL 25 MG PO TABS: 12.5000 mg | ORAL_TABLET | Freq: Once | ORAL | Status: DC

## 2018-01-09 MED ORDER — ONDANSETRON HCL 4 MG/2ML IJ SOLN: 4.0000 mg | Freq: Four times a day (QID) | INTRAMUSCULAR | Status: DC | PRN

## 2018-01-09 MED ORDER — LACTATED RINGERS IV SOLN
INTRAVENOUS | Status: DC
  Administered 2018-01-09 – 2018-01-10 (×2): via INTRAVENOUS

## 2018-01-09 MED ORDER — ONDANSETRON HCL 4 MG PO TABS: 4.0000 mg | ORAL_TABLET | Freq: Four times a day (QID) | ORAL | Status: DC | PRN

## 2018-01-09 MED ORDER — ACETAMINOPHEN 325 MG PO TABS: 650.0000 mg | ORAL_TABLET | Freq: Four times a day (QID) | ORAL | Status: DC | PRN

## 2018-01-09 MED ORDER — VANCOMYCIN HCL IN DEXTROSE 1-5 GM/200ML-% IV SOLN
1000.0000 mg | Freq: Once | INTRAVENOUS | Status: DC
  Filled 2018-01-09: qty 200

## 2018-01-09 MED ORDER — DEXTROSE 5 % IV SOLN
500.0000 mg | INTRAVENOUS | Status: DC
  Administered 2018-01-09 – 2018-01-10 (×2): 500 mg via INTRAVENOUS
  Filled 2018-01-09 (×3): qty 500

## 2018-01-09 NOTE — ED Provider Notes (Signed)
[facility] EMERGENCY DEPARTMENT Provider Note   CSN: [id number] Arrival date & time: 01/09/18  1216     History   Chief Complaint No chief complaint on file.   HPI Martin Tanner is a 84 y.o. male.  HPI   84 year old Nepali speaking male with history of anemia, chronic kidney disease, brought here via EMS from home for evaluation of dizziness.  History is limited despite using language interpreter.  Patient is hard of hearing and does not answer questions appropriately.  Patient report developing nausea, and has had persistent vomitus at least 5-6 times since yesterday.  He also report feeling lightheadedness and dizzy with position change.  He denies any associated pain specifically no chest pain, trouble breathing, abdominal pain or back pain or dysuria.  He report having similar episode like this a month ago in which he was seen in the ER and was admitted for evaluation of hematemesis.  Patient was found to have mild hematemesis due to suspected gastritis and was subsequently discharged home with PPI after he had an EGD.  He mentioned symptoms felt similar to prior.  He denies any specific treatment tried.  States he does not taking medication on a regular basis.  Past Medical History:  Diagnosis Date  . BPH (benign prostatic hyperplasia)   . Upper GI bleed 11/17/2017    Patient Active Problem List   Diagnosis Date Noted  . Anemia due to chronic kidney disease 11/18/2017  . Chronic kidney disease, stage III (moderate) (HCC) 11/18/2017  . 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 Hematemesis 11/17/2017  . Hyponatremia 04/01/2013  . Pyelonephritis 01/28/2013  . Metabolic acidosis 01/28/2013  . Thrombocytopenia (HCC) 01/28/2013  . ARF (acute renal failure) (HCC) 01/25/2013  . UTI (lower urinary tract infection) 01/25/2013  . Urinary retention 01/25/2013  . Anemia 01/25/2013  . Weakness generalized 01/25/2013    Past Surgical History:  Procedure Laterality Date  .  ESOPHAGOGASTRODUODENOSCOPY (EGD) WITH PROPOFOL N/A 11/18/2017   Procedure: ESOPHAGOGASTRODUODENOSCOPY (EGD) WITH PROPOFOL;  Surgeon: Vida Rigger, MD;  Location: [facility] ENDOSCOPY;  Service: Endoscopy;  Laterality: N/A;  . None    . TRANSURETHRAL RESECTION OF PROSTATE N/A 03/16/2013   Procedure: TRANSURETHRAL RESECTION OF THE PROSTATE WITH GYRUS INSTRUMENTS;  Surgeon: Marcine Matar, MD;  Location: [facility] ORS;  Service: Urology;  Laterality: N/A;       Home Medications    Prior to Admission medications   Medication Sig Start Date End Date Taking? Authorizing Provider  feeding supplement, ENSURE ENLIVE, (ENSURE ENLIVE) LIQD Take 237 mLs by mouth 3 (three) times daily between meals. 11/18/17   Joseph Art, DO  pantoprazole (PROTONIX) 40 MG tablet Take 1 tablet (40 mg total) by mouth daily. 11/19/17   Joseph Art, DO    Family History Family History  Problem Relation Age of Onset  . Other Unknown        Negative CAD  . Other Unknown        Negative DM2  . Other Unknown        Negative HTN  . Other Unknown        Negative Cancer    Social History Social History   Tobacco Use  . Smoking status: Current Every Day Smoker    Packs/day: 0.25    Years: 62.00    Pack years: 15.50    Types: Cigarettes  . Smokeless tobacco: Never Used  Substance Use Topics  . Alcohol use: No    Alcohol/week: 0.0 oz  .  Drug use: No     Allergies   Patient has no known allergies.   Review of Systems Review of Systems  Unable to perform ROS: Other  Hard of hearing   Physical Exam Updated Vital Signs BP (!) 173/88 (BP Location: Right Arm)   Pulse 73   Temp 98.9 F (37.2 C) (Oral)   Resp 16   Physical Exam  Constitutional: He appears well-developed and well-nourished. No distress.  Frail-appearing male lying in bed in no acute discomfort.  HENT:  Head: Atraumatic.  Poor dentition, moist mucosa  Eyes: Conjunctivae and EOM are normal. Pupils are equal, round, and reactive to light.   No nystagmus  Neck: Normal range of motion. Neck supple.  No nuchal rigidity  Cardiovascular: Normal rate and regular rhythm.  Pulmonary/Chest: Effort normal and breath sounds normal.  Abdominal: Soft. He exhibits no distension. There is no tenderness.  Genitourinary: Rectal exam shows guaiac negative stool.  Musculoskeletal: He exhibits tenderness (Left lower leg is mildly edematous and mildly erythematous with tender to palpation to the dorsum of foot.  Intact dorsalis pedis pulse).  5 out of 5 strength all 4 extremities  Neurological: He is alert.  Alert to self and situation.  Unable to tell dates  Skin: No rash noted.  Psychiatric: He has a normal mood and affect.  Nursing note and vitals reviewed.    ED Treatments / Results  Labs (all labs ordered are listed, but only abnormal results are displayed) Labs Reviewed  CBC WITH DIFFERENTIAL/PLATELET - Abnormal; Notable for the following components:      Result Value   RBC 3.74 (*)    Hemoglobin 11.3 (*)    HCT 34.7 (*)    Platelets 92 (*)    Lymphs Abs 0.5 (*)    All other components within normal limits  COMPREHENSIVE METABOLIC PANEL - Abnormal; Notable for the following components:   Creatinine, Ser 1.26 (*)    Calcium 8.5 (*)    Albumin 3.4 (*)    ALT 11 (*)    GFR calc non Af Amer 49 (*)    GFR calc Af Amer 57 (*)    All other components within normal limits  URINALYSIS, ROUTINE W REFLEX MICROSCOPIC - Abnormal; Notable for the following components:   Color, Urine STRAW (*)    Hgb urine dipstick SMALL (*)    Squamous Epithelial / LPF 0-5 (*)    All other components within normal limits  LIPASE, BLOOD  POC OCCULT BLOOD, ED    EKG  EKG Interpretation  Date/Time:  Thursday January 09 2018 13:12:33 EST Ventricular Rate:  81 PR Interval:  170 QRS Duration: 96 QT Interval:  358 QTC Calculation: 415 R Axis:   63 Text Interpretation:  Normal sinus rhythm Normal ECG No significant change since last tracing  Confirmed by Alvira Monday ([id number]) on 01/09/2018 2:27:51 PM       Radiology Dg Chest 2 View  Result Date: 01/09/2018 CLINICAL DATA:  Nausea and vomiting for 18 hours EXAM: CHEST  2 VIEW COMPARISON:  November 17, 2017 FINDINGS: The heart size and mediastinal contours are stable. The aorta is tortuous. Mild diffuse increased pulmonary interstitium is identified bilaterally. There no focal pneumonia or pleural effusion. The visualized skeletal structures are unremarkable. IMPRESSION: Mild diffuse increased pulmonary interstitium noted bilaterally which may represent edema or atypical infection. Electronically Signed   By: Sherian Rein M.D.   On: 01/09/2018 14:11   Ct Head Wo Contrast  Result Date: 01/09/2018 CLINICAL  DATA:  Vertigo. EXAM: CT HEAD WITHOUT CONTRAST TECHNIQUE: Contiguous axial images were obtained from the base of the skull through the vertex without intravenous contrast. COMPARISON:  None. FINDINGS: Brain: Moderate diffuse cortical atrophy is noted. Mild chronic ischemic white matter disease is noted. Probable old infarction is seen in the pons. Old lacunar infarction is noted in right basal ganglia. No mass effect or midline shift is noted. Ventricular size is within normal limits. There is no evidence of mass lesion, hemorrhage or acute infarction. Vascular: No hyperdense vessel or unexpected calcification. Skull: Normal. Negative for fracture or focal lesion. Sinuses/Orbits: Mild bilateral ethmoid sinusitis. Other: None. IMPRESSION: Moderate diffuse cortical atrophy. Mild chronic ischemic white matter disease. 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 No acute intracranial abnormality seen. Electronically Signed   By: Lupita RaiderJames  Green Jr, M.D.   On: 01/09/2018 14:25    Procedures Procedures (including critical care time)  Medications Ordered in ED Medications  ceFEPIme (MAXIPIME) 2 g in dextrose 5 % 50 mL IVPB (not administered)  vancomycin (VANCOCIN) IVPB 1000 mg/200 mL premix (not  administered)  vancomycin (VANCOCIN) 500 mg in sodium chloride 0.9 % 100 mL IVPB (not administered)  ondansetron (ZOFRAN) injection 4 mg (4 mg Intravenous Given 01/09/18 1312)  meclizine (ANTIVERT) tablet 12.5 mg (12.5 mg Oral Given 01/09/18 1327)  sodium chloride 0.9 % bolus 1,000 mL (1,000 mLs Intravenous New Bag/Given 01/09/18 1322)     Initial Impression / Assessment and Plan / ED Course  I have reviewed the triage vital signs and the nursing notes.  Pertinent labs & imaging results that were available during my care of the patient were reviewed by me and considered in my medical decision making (see chart for details).     BP 134/75   Pulse 84   Temp 98.9 F (37.2 C)   Resp 18   Ht 4\' 10"  (1.473 m)   Wt 43.1 kg (95 lb)   SpO2 98%   BMI 19.86 kg/m    Final Clinical Impressions(s) / ED Diagnoses   Final diagnoses:  HCAP (healthcare-associated pneumonia)  Intractable vomiting with nausea, unspecified vomiting type  Vertigo    ED Discharge Orders    None     1:01 PM Patient here with nausea and vomiting, and report having lightheadedness and dizziness with positional change.  Has similar episode like this a month ago in which he was hospitalized for hematemesis which was thought related to relate to mild gastritis.  Patient discharged home with PPI.  I feel that his symptoms is likely related to the recent hospitalization.  I have low suspicion for stroke causing him to feel this way.  However, will obtain head CT scan and screening labs along with EKG, chest x-ray, and UA.  He did have some nonproductive cough while he was in the room.  He also has redness and swelling to his left lower extremity in which he mentioned more than a year.  He was treated for suspected cellulitis with antibiotic in the past as well as suspected gout.  He does not appear to be complaining about increased foot pain no leg pain.  2:39 PM Patient is coughing profusely in the room, chest x-ray  showing mild diffuse increased pulmonary incision noted bilaterally which may represent edema or atypical infection.  Given his age, renal disease history, and he appears to be either hard of hearing or potentially altered mental states he may benefit from admission.  Pt has a PORT score of 118, hospitalization is recommended.  3:51 PM Head CT scan without acute abnormalities.  UA without urinary tract infection.  Fecal blood test is negative.  No leukocytosis.  Lactic acid is pending.  Creatinine is mildly elevated at 1.26, IV fluid given.  Appreciate consultation from [facility] who agrees to see pt and will admit for further evaluation and treatment of potential HCAP.  Pt started on cefepime and vancomycin.  Pt did received antinausea medication and he's feeling better.    4:10 PM Pt developed itchiness after receiving cefepime.  No prior hx of allergy.  Will discontinue cefepime and initiate aztreonam in its place.  Benadryl given for reaction.        Fayrene Helper, PA-C 01/09/18 1611    Alvira Monday, MD 01/09/18 1840

## 2018-01-09 NOTE — ED Notes (Signed)
Pt began to have generalized itching, abx stopped.

## 2018-01-09 NOTE — ED Triage Notes (Signed)
Arrived via EMS from home. Nausea and emesis for 18 hours. EMS administered Zofran 4 MG IVP and 0.9 NS 500 ml prior to arrival. CBG 110

## 2018-01-09 NOTE — ED Notes (Signed)
Spoke with provider do not need blood cultures for patient.

## 2018-01-09 NOTE — ED Notes (Signed)
Attempted report 

## 2018-01-09 NOTE — ED Notes (Signed)
Use language line via ipad to explain to patient procedure to obtain occult blood sample.  Verbalized understanding.

## 2018-01-09 NOTE — Progress Notes (Signed)
Pharmacy Antibiotic Note  Martin Tanner is a 84 y.o. male admitted on 01/09/2018 with pneumonia.  Pharmacy has been consulted for vancomycin dosing. Pt is afebrile. CKD with Scr 1.26. Hx of UTI/pyelo.   Plan: Vancomycin 1000mg  IV x1 Vancomycin 500 IV every 24 hours.  Goal trough 15-20 mcg/mL.  F/u renal fxn, trough @ SS, clinical resolution F/u cefepime and other gram neg coverage  Height: 4\' 10"  (147.3 cm) Weight: 95 lb (43.1 kg) IBW/kg (Calculated) : 45.4  Temp (24hrs), Avg:98.9 F (37.2 C), Min:98.9 F (37.2 C), Max:98.9 F (37.2 C)  Recent Labs  Lab 01/09/18 1321  CREATININE 1.26*    Estimated Creatinine Clearance: 24.7 mL/min (A) (by C-G formula based on SCr of 1.26 mg/dL (H)).    No Known Allergies  Antimicrobials this admission: Vanc 1/17 >>   Microbiology results: Pending  Thank you for allowing pharmacy to be a part of this patient's care.  Orlan Aversa 01/09/2018 2:49 PM

## 2018-01-09 NOTE — H&P (Signed)
History and Physical    Martin Tanner:[id number] DOB: 1928/11/22 DOA: 01/09/2018   PCP: System, Pcp Not In   Patient coming from:  Home    Chief Complaint: Dizziness and nausea  HPI: Martin Tanner is a 84 y.o. male panel with medical history significant for anemia, chronic kidney disease, prior history of GI bleed, presenting via EMS from home, for evaluation of dizziness, nausea and some vomiting, about 5-6 times since yesterday.  He was feeling lightheaded and dizzy, especially with position change, denying headaches, or vision changes.  Hedenies any breathing issues abdominal pain, back pain or dysuria.  History is limited, despite using language interpreter by EDP, being hard of hearing.  His son is in the room with him, unable to answer some questions, but with his English is also limited.  No apparent chest pain, or palpitations.  No shortness of breath.  However, the patient was coughing, he reported some hemoptysis. Because of the cough, chest x-ray was performed, which showed mild diffuse increased pulmonary infiltrate, representing edema versus atypical infection.  He is being admitted for the management of his symptoms, as well as for the diagnosis of atypical pneumonia.  PORT score of 118, hospitalization is recommended.  Other history cannot be obtained at this time.  ED Course:  BP 125/79   Pulse 87   Temp 98.9 F (37.2 C)   Resp 18   Ht 4\' 10"  (1.473 m)   Wt 43.1 kg (95 lb)   SpO2 97%   BMI 19.86 kg/m   CT of the head was negative EKG was in sinus rhythm without any ACS findings Chest x-ray as above, showed possible atypical pneumonia UA is negative He was given a dose of cefepime and vancomycin the ED. Antiemetics, with significant improvement of his symptoms. No leukocytosis Creatinine was mildly elevated at 1.26, receiving IV fluids Lactic acid is also 1.26  Review of Systems:  As per HPI otherwise all other systems reviewed and are negative  Past Medical  History:  Diagnosis Date  . Anemia   . BPH (benign prostatic hyperplasia)   . Chronic kidney disease   . Upper GI bleed 11/17/2017    Past Surgical History:  Procedure Laterality Date  . ESOPHAGOGASTRODUODENOSCOPY (EGD) WITH PROPOFOL N/A 11/18/2017   Procedure: ESOPHAGOGASTRODUODENOSCOPY (EGD) WITH PROPOFOL;  Surgeon: Vida Rigger, MD;  Location: [facility] ENDOSCOPY;  Service: Endoscopy;  Laterality: N/A;  . None    . TRANSURETHRAL RESECTION OF PROSTATE N/A 03/16/2013   Procedure: TRANSURETHRAL RESECTION OF THE PROSTATE WITH GYRUS INSTRUMENTS;  Surgeon: Marcine Matar, MD;  Location: [facility] ORS;  Service: Urology;  Laterality: N/A;    Social History Social History   Socioeconomic History  . Marital status: Widowed    Spouse name: Not on file  . Number of children: Not on file  . Years of education: Not on file  . Highest education level: Not on file  Social Needs  . Financial resource strain: Not on file  . Food insecurity - worry: Not on file  . Food insecurity - inability: Not on file  . Transportation needs - medical: Not on file  . Transportation needs - non-medical: Not on file  Occupational History  . Not on file  Tobacco Use  . Smoking status: Current Every Day Smoker    Packs/day: 0.25    Years: 62.00    Pack years: 15.50    Types: Cigarettes  . Smokeless tobacco: Never Used  Substance and Sexual Activity  .  Alcohol use: No    Alcohol/week: 0.0 oz  . Drug use: No  . Sexual activity: No  Other Topics Concern  . Not on file  Social History Narrative  . Not on file     No Known Allergies  Family History  Problem Relation Age of Onset  . Other Unknown        Negative CAD  . Other Unknown        Negative DM2  . Other Unknown        Negative HTN  . Other Unknown        Negative Cancer      Prior to Admission medications   Medication Sig Start Date End Date Taking? Authorizing Provider  feeding supplement, ENSURE ENLIVE, (ENSURE ENLIVE) LIQD Take 237 mLs  by mouth 3 (three) times daily between meals. 11/18/17   Joseph ArtVann, Jessica U, DO  pantoprazole (PROTONIX) 40 MG tablet Take 1 tablet (40 mg total) by mouth daily. 11/19/17   Joseph ArtVann, Jessica U, DO    Physical Exam:  Vitals:   01/09/18 1345 01/09/18 1430 01/09/18 1500 01/09/18 1530  BP: (!) 148/76 (!) 142/72 134/75 125/79  Pulse: 84 84 84 87  Resp: 16 16 18 18   Temp:      TempSrc:      SpO2: 100% 99% 98% 97%  Weight:      Height:       Constitutional: NAD, calm, comfortable, frail, cachectic, awake, but unable to engage in conversation to the language barrier Eyes: PERRL, lids and conjunctivae normal ENMT: Mucous membranes are dry without exudate or lesions, vision poor Neck: normal, supple, no masses, no thyromegaly Respiratory:slightly decreased breath sounds at the bases, no wheezing, no crackles. Normal respiratory effort  Cardiovascular: Regular rate and rhythm,  murmur, rubs or gallops. No extremity edema. 2+ pedal pulses. No carotid bruits.  Abdomen: Soft, non tender, No hepatosplenomegaly. Bowel sounds positive.  Musculoskeletal: no clubbing / cyanosis. Moves all extremities Skin: no jaundice, No lesions.  Neurologic: Sensation intact  Strength equal in all extremities     Labs on Admission: I have personally reviewed following labs and imaging studies  CBC: Recent Labs  Lab 01/09/18 1321  WBC 4.7  NEUTROABS 3.9  HGB 11.3*  HCT 34.7*  MCV 92.8  PLT 92*    Basic Metabolic Panel: Recent Labs  Lab 01/09/18 1321  NA 138  K 4.3  CL 104  CO2 22  GLUCOSE 95  BUN 12  CREATININE 1.26*  CALCIUM 8.5*    GFR: Estimated Creatinine Clearance: 24.7 mL/min (A) (by C-G formula based on SCr of 1.26 mg/dL (H)).  Liver Function Tests: Recent Labs  Lab 01/09/18 1321  AST 22  ALT 11*  ALKPHOS 83  BILITOT 1.1  PROT 7.2  ALBUMIN 3.4*   Recent Labs  Lab 01/09/18 1321  LIPASE 25   No results for input(s): AMMONIA in the last 168 hours.  Coagulation Profile: No  results for input(s): INR, PROTIME in the last 168 hours.  Cardiac Enzymes: No results for input(s): CKTOTAL, CKMB, CKMBINDEX, TROPONINI in the last 168 hours.  BNP (last 3 results) No results for input(s): PROBNP in the last 8760 hours.  HbA1C: No results for input(s): HGBA1C in the last 72 hours.  CBG: No results for input(s): GLUCAP in the last 168 hours.  Lipid Profile: No results for input(s): CHOL, HDL, LDLCALC, TRIG, CHOLHDL, LDLDIRECT in the last 72 hours.  Thyroid Function Tests: No results for input(s): TSH, T4TOTAL, FREET4,  T3FREE, THYROIDAB in the last 72 hours.  Anemia Panel: No results for input(s): VITAMINB12, FOLATE, FERRITIN, TIBC, IRON, RETICCTPCT in the last 72 hours.  Urine analysis:    Component Value Date/Time   COLORURINE STRAW (A) 01/09/2018 1349   APPEARANCEUR CLEAR 01/09/2018 1349   LABSPEC 1.009 01/09/2018 1349   PHURINE 7.0 01/09/2018 1349   GLUCOSEU NEGATIVE 01/09/2018 1349   HGBUR SMALL (A) 01/09/2018 1349   BILIRUBINUR NEGATIVE 01/09/2018 1349   BILIRUBINUR neg 02/23/2014 1818   KETONESUR NEGATIVE 01/09/2018 1349   PROTEINUR NEGATIVE 01/09/2018 1349   UROBILINOGEN 0.2 02/23/2014 1818   UROBILINOGEN 0.2 03/31/2013 1700   NITRITE NEGATIVE 01/09/2018 1349   LEUKOCYTESUR NEGATIVE 01/09/2018 1349    Sepsis Labs: @LABRCNTIP (procalcitonin:4,lacticidven:4) )No results found for this or any previous visit (from the past 240 hour(s)).   Radiological Exams on Admission: Dg Chest 2 View  Result Date: 01/09/2018 CLINICAL DATA:  Nausea and vomiting for 18 hours EXAM: CHEST  2 VIEW COMPARISON:  November 17, 2017 FINDINGS: The heart size and mediastinal contours are stable. The aorta is tortuous. Mild diffuse increased pulmonary interstitium is identified bilaterally. There no focal pneumonia or pleural effusion. The visualized skeletal structures are unremarkable. IMPRESSION: Mild diffuse increased pulmonary interstitium noted bilaterally which may  represent edema or atypical infection. Electronically Signed   By: Sherian Rein M.D.   On: 01/09/2018 14:11   Ct Head Wo Contrast  Result Date: 01/09/2018 CLINICAL DATA:  Vertigo. EXAM: CT HEAD WITHOUT CONTRAST TECHNIQUE: Contiguous axial images were obtained from the base of the skull through the vertex without intravenous contrast. COMPARISON:  None. FINDINGS: Brain: Moderate diffuse cortical atrophy is noted. Mild chronic ischemic white matter disease is noted. Probable old infarction is seen in the pons. Old lacunar infarction is noted in right basal ganglia. No mass effect or midline shift is noted. Ventricular size is within normal limits. There is no evidence of mass lesion, hemorrhage or acute infarction. Vascular: No hyperdense vessel or unexpected calcification. Skull: Normal. Negative for fracture or focal lesion. Sinuses/Orbits: Mild bilateral ethmoid sinusitis. Other: None. IMPRESSION: Moderate diffuse cortical atrophy. Mild chronic ischemic white matter disease. 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 No acute intracranial abnormality seen. Electronically Signed   By: Lupita Raider, M.D.   On: 01/09/2018 14:25    EKG: Independently reviewed.  Assessment/Plan Principal Problem:   Community acquired pneumonia Active Problems:   ARF (acute renal failure) (HCC)   Anemia   Hematemesis   Anemia due to chronic kidney disease   Chronic kidney disease, stage III (moderate) (HCC)   Nausea and vomiting   Incidental bilateral atypical pneumonia, CURB 65 1 WBC normal lactic acid 0.26 received vancomycin and cefepime at the ED.  However, this appears to be community-acquired, therefore will downgrade the antibiotic to atypical coverage. Pneumonia order set Azithromycin per pharmacy Oxygen as needed sputum cultures  IV antibiotics with per protocol Procalcitonin,Strep pneumo urine antigen, Legionella urine antigen IV fluids Mucinex Albuterol as needed  Nausea, dizziness, vomiting, in the  setting of increased mucus congestion, dehydration.  History of GI bleed in the past the patient received IV fluids, antiemetics, as well as Compazine, and his symptoms improved significantly.  His vital signs are stable, he is afebrile.  CT of the head is negative.  EKG without ACS.   Antiemetics as needed We will continue to monitor  Anemia of chronic disease, recent history of GIB Hemoglobin on admission stable at 11.6  Repeat CBC in am  No transfusion is  indicated at this time Continue Protonix for his history of GI bleed.  Chronic kidney disease stage    baseline creatinine    Current Cr is  Lab Results  Component Value Date   CREATININE 1.26 (H) 01/09/2018   CREATININE 1.14 11/18/2017   CREATININE 1.16 11/17/2017   IVF  Hold NSAIDS   Chronic CKD: likely due to dehydration   baseline creatinine    Current Cr is 1.14, currently 1.26. Lab Results  Component Value Date   CREATININE 1.26 (H) 01/09/2018   CREATININE 1.14 11/18/2017   CREATININE 1.16 11/17/2017   Hold NSAIDs IVF  Follow BMP in a.m.      DVT prophylaxis:  SCD  Code Status:  FUll    Family Communication:  Discussed with patient's son.  He wishes to have a work excuse, which is only legal for the patient, but no for the family members.  Therefore, that cannot be obtained, after session with physicians.  Will obtain interpreter if needed. disposition Plan: Expect patient to be discharged to home after condition improves Consults called:    None  Admission status: MEdsurg Obs   Marlowe Kays, PA-C [facility]   Amion text  [phone]   01/09/2018, 4:31 PM

## 2018-01-09 NOTE — ED Notes (Signed)
Provider and nurse using ipad language line to speak with patient.

## 2018-01-10 ENCOUNTER — Inpatient Hospital Stay (HOSPITAL_COMMUNITY): Payer: Medicaid Other

## 2018-01-10 DIAGNOSIS — R5381 Other malaise: Secondary | ICD-10-CM | POA: Diagnosis present

## 2018-01-10 DIAGNOSIS — Z79899 Other long term (current) drug therapy: Secondary | ICD-10-CM | POA: Diagnosis not present

## 2018-01-10 DIAGNOSIS — E43 Unspecified severe protein-calorie malnutrition: Secondary | ICD-10-CM | POA: Diagnosis present

## 2018-01-10 DIAGNOSIS — R109 Unspecified abdominal pain: Secondary | ICD-10-CM | POA: Diagnosis present

## 2018-01-10 DIAGNOSIS — R112 Nausea with vomiting, unspecified: Secondary | ICD-10-CM | POA: Diagnosis not present

## 2018-01-10 DIAGNOSIS — F17210 Nicotine dependence, cigarettes, uncomplicated: Secondary | ICD-10-CM | POA: Diagnosis present

## 2018-01-10 DIAGNOSIS — D696 Thrombocytopenia, unspecified: Secondary | ICD-10-CM | POA: Diagnosis present

## 2018-01-10 DIAGNOSIS — E860 Dehydration: Secondary | ICD-10-CM | POA: Diagnosis present

## 2018-01-10 DIAGNOSIS — N400 Enlarged prostate without lower urinary tract symptoms: Secondary | ICD-10-CM | POA: Diagnosis present

## 2018-01-10 DIAGNOSIS — N179 Acute kidney failure, unspecified: Secondary | ICD-10-CM | POA: Diagnosis present

## 2018-01-10 DIAGNOSIS — Y95 Nosocomial condition: Secondary | ICD-10-CM | POA: Diagnosis present

## 2018-01-10 DIAGNOSIS — Z8719 Personal history of other diseases of the digestive system: Secondary | ICD-10-CM | POA: Diagnosis not present

## 2018-01-10 DIAGNOSIS — D631 Anemia in chronic kidney disease: Secondary | ICD-10-CM | POA: Diagnosis present

## 2018-01-10 DIAGNOSIS — N183 Chronic kidney disease, stage 3 (moderate): Secondary | ICD-10-CM | POA: Diagnosis present

## 2018-01-10 DIAGNOSIS — R42 Dizziness and giddiness: Secondary | ICD-10-CM | POA: Diagnosis not present

## 2018-01-10 DIAGNOSIS — J189 Pneumonia, unspecified organism: Principal | ICD-10-CM

## 2018-01-10 DIAGNOSIS — R29700 NIHSS score 0: Secondary | ICD-10-CM | POA: Diagnosis present

## 2018-01-10 DIAGNOSIS — H9190 Unspecified hearing loss, unspecified ear: Secondary | ICD-10-CM | POA: Diagnosis present

## 2018-01-10 DIAGNOSIS — Z681 Body mass index (BMI) 19 or less, adult: Secondary | ICD-10-CM | POA: Diagnosis not present

## 2018-01-10 DIAGNOSIS — K920 Hematemesis: Secondary | ICD-10-CM | POA: Diagnosis present

## 2018-01-10 LAB — CBC
HCT: 31.4 % — ABNORMAL LOW (ref 39.0–52.0)
Hemoglobin: 10.4 g/dL — ABNORMAL LOW (ref 13.0–17.0)
MCH: 30.5 pg (ref 26.0–34.0)
MCHC: 33.1 g/dL (ref 30.0–36.0)
MCV: 92.1 fL (ref 78.0–100.0)
PLATELETS: 96 10*3/uL — AB (ref 150–400)
RBC: 3.41 MIL/uL — ABNORMAL LOW (ref 4.22–5.81)
RDW: 13.3 % (ref 11.5–15.5)
WBC: 3.2 10*3/uL — AB (ref 4.0–10.5)

## 2018-01-10 LAB — BASIC METABOLIC PANEL
Anion gap: 9 (ref 5–15)
BUN: 15 mg/dL (ref 6–20)
CO2: 24 mmol/L (ref 22–32)
CREATININE: 1.38 mg/dL — AB (ref 0.61–1.24)
Calcium: 8.6 mg/dL — ABNORMAL LOW (ref 8.9–10.3)
Chloride: 107 mmol/L (ref 101–111)
GFR calc Af Amer: 51 mL/min — ABNORMAL LOW (ref >60)
GFR calc non Af Amer: 44 mL/min — ABNORMAL LOW (ref >60)
Glucose, Bld: 74 mg/dL (ref 65–99)
POTASSIUM: 4.4 mmol/L (ref 3.5–5.1)
SODIUM: 140 mmol/L (ref 135–145)

## 2018-01-10 LAB — STREP PNEUMONIAE URINARY ANTIGEN: STREP PNEUMO URINARY ANTIGEN: NEGATIVE

## 2018-01-10 MED ORDER — IOPAMIDOL (ISOVUE-300) INJECTION 61%
INTRAVENOUS | Status: AC
  Administered 2018-01-10: 20:00:00
  Filled 2018-01-10: qty 30

## 2018-01-10 MED ORDER — IOPAMIDOL (ISOVUE-300) INJECTION 61%
INTRAVENOUS | Status: AC
  Administered 2018-01-10: 75 mL
  Filled 2018-01-10: qty 75

## 2018-01-10 MED ORDER — LACTATED RINGERS IV BOLUS (SEPSIS)
500.0000 mL | Freq: Once | INTRAVENOUS | Status: AC
  Administered 2018-01-10: 500 mL via INTRAVENOUS

## 2018-01-10 MED ORDER — SODIUM CHLORIDE 0.9 % IV SOLN
INTRAVENOUS | Status: DC
  Administered 2018-01-10 – 2018-01-11 (×3): via INTRAVENOUS

## 2018-01-10 NOTE — Progress Notes (Signed)
PROGRESS NOTE    Martin Tanner  ZOX:[id number] DOB: 21-Sep-1928 DOA: 01/09/2018 PCP: System, Pcp Not In   Brief Narrative: 84 year old male with history of chronic kidney disease stage III, prior GI bleed, bilateral hydronephrosis, BPH, prior admission for nausea vomiting presented with generalized weakness, nausea, vomiting and decreased oral intake.  Denied chest pain, shortness of breath, headache or fever. Chest x-ray with possible atypical pneumonia therefore started on antibiotics and admitted for further evaluation.  Assessment & Plan:  #Intractable nausea/vomiting/abdominal pain/dehydration: Patient has had these symptoms in the past as well.  He looks dry on physical exam and has very minimal oral intake.  Reported nausea but no vomiting.  I reviewed his CT scan from prior which was consistent with hydronephrosis.  I am planning for CT scan of abdomen pelvis to further evaluate.  Continue IV fluid, Zofran and supportive care.  #Severe protein calorie malnutrition: Dietary referral.  Evaluation ongoing as above.  #Atypical pneumonia/community core pneumonia: Patient reported no shortness of breath and not hypoxic.  Continue azithromycin and supportive care.  #Chronic kidney disease stage III: Continue IV fluid.  Monitor BMP.  Avoid for toxins.  #Chronic thrombocytopenia: Monitor CBC.  No sign of bleeding.  #Physical deconditioning: PT OT evaluation.  DVT prophylaxis:SCD Code Status: Full code Family Communication: Discussed with patient's son-in-law this morning Disposition Plan: Likely discharge home in 1-2 days    Consultants:   None  Procedures: None Antimicrobials: Azithromycin  Subjective: Seen and examined at bedside.  Patient reported nausea, decreased oral intake and not feeling well.  He has the symptoms going on and recently worsened.  Denied fever, chills, chest pain, shortness of breath or cough.  Patient son-in-law at bedside.  This provider can speak  N[place]ali language.  Objective: Vitals:   01/09/18 2027 01/09/18 2031 01/10/18 0502 01/10/18 1346  BP: 127/70  115/66 (!) 159/78  Pulse: 76  78 84  Resp: 18  16 16   Temp: 99 F (37.2 C)  97.7 F (36.5 C) 98 F (36.7 C)  TempSrc: Oral  Oral Oral  SpO2: 99%  98% 100%  Weight:  43.8 kg (96 lb 9.6 oz) 43.8 kg (96 lb 9 oz)   Height:        Intake/Output Summary (Last 24 hours) at 01/10/2018 1500 Last data filed at 01/10/2018 1349 Gross per 24 hour  Intake 1065 ml  Output 750 ml  Net 315 ml   Filed Weights   01/09/18 1302 01/09/18 2031 01/10/18 0502  Weight: 43.1 kg (95 lb) 43.8 kg (96 lb 9.6 oz) 43.8 kg (96 lb 9 oz)    Examination:  General exam: Thin cachectic elderly looking male lying on bed Respiratory system: Clear to auscultation. Respiratory effort normal. No wheezing or crackle Cardiovascular system: S1 & S2 heard, RRR.  No pedal edema. Gastrointestinal system: Abdomen is nondistended, soft and nontender. Normal bowel sounds heard. Central nervous system: Alert awake and following commands Extremities: Symmetric 5 x 5 power. Skin: No rashes, lesions or ulcers     Data Reviewed: I have personally reviewed following labs and imaging studies  CBC: Recent Labs  Lab 01/09/18 1321 01/10/18 0402  WBC 4.7 3.2*  NEUTROABS 3.9  --   HGB 11.3* 10.4*  HCT 34.7* 31.4*  MCV 92.8 92.1  PLT 92* 96*   Basic Metabolic Panel: Recent Labs  Lab 01/09/18 1321 01/10/18 0402  NA 138 140  K 4.3 4.4  CL 104 107  CO2 22 24  GLUCOSE 95 74  BUN 12 15  CREATININE 1.26* 1.38*  CALCIUM 8.5* 8.6*   GFR: Estimated Creatinine Clearance: 22.9 mL/min (A) (by C-G formula based on SCr of 1.38 mg/dL (H)). Liver Function Tests: Recent Labs  Lab 01/09/18 1321  AST 22  ALT 11*  ALKPHOS 83  BILITOT 1.1  PROT 7.2  ALBUMIN 3.4*   Recent Labs  Lab 01/09/18 1321  LIPASE 25   No results for input(s): AMMONIA in the last 168 hours. Coagulation Profile: No results for input(s):  INR, PROTIME in the last 168 hours. Cardiac Enzymes: No results for input(s): CKTOTAL, CKMB, CKMBINDEX, TROPONINI in the last 168 hours. BNP (last 3 results) No results for input(s): PROBNP in the last 8760 hours. HbA1C: No results for input(s): HGBA1C in the last 72 hours. CBG: No results for input(s): GLUCAP in the last 168 hours. Lipid Profile: No results for input(s): CHOL, HDL, LDLCALC, TRIG, CHOLHDL, LDLDIRECT in the last 72 hours. Thyroid Function Tests: No results for input(s): TSH, T4TOTAL, FREET4, T3FREE, THYROIDAB in the last 72 hours. Anemia Panel: No results for input(s): VITAMINB12, FOLATE, FERRITIN, TIBC, IRON, RETICCTPCT in the last 72 hours. Sepsis Labs: Recent Labs  Lab 01/09/18 1735  LATICACIDVEN 1.26    Recent Results (from the past 240 hour(s))  Culture, blood (routine x 2) Call MD if unable to obtain prior to antibiotics being given     Status: None (Preliminary result)   Collection Time: 01/09/18  5:24 PM  Result Value Ref Range Status   Specimen Description BLOOD RIGHT ANTECUBITAL  Final   Special Requests   Final    BOTTLES DRAWN AEROBIC AND ANAEROBIC Blood Culture adequate volume   Culture NO GROWTH < 24 HOURS  Final   Report Status PENDING  Incomplete  Culture, blood (routine x 2) Call MD if unable to obtain prior to antibiotics being given     Status: None (Preliminary result)   Collection Time: 01/09/18  5:30 PM  Result Value Ref Range Status   Specimen Description BLOOD BLOOD RIGHT FOREARM  Final   Special Requests   Final    BOTTLES DRAWN AEROBIC ONLY Blood Culture adequate volume   Culture NO GROWTH < 12 HOURS  Final   Report Status PENDING  Incomplete         Radiology Studies: Dg Chest 2 View  Result Date: 01/09/2018 CLINICAL DATA:  Nausea and vomiting for 18 hours EXAM: CHEST  2 VIEW COMPARISON:  November 17, 2017 FINDINGS: The heart size and mediastinal contours are stable. The aorta is tortuous. Mild diffuse increased pulmonary  interstitium is identified bilaterally. There no focal pneumonia or pleural effusion. The visualized skeletal structures are unremarkable. IMPRESSION: Mild diffuse increased pulmonary interstitium noted bilaterally which may represent edema or atypical infection. Electronically Signed   By: Sherian Rein M.D.   On: 01/09/2018 14:11   Ct Head Wo Contrast  Result Date: 01/09/2018 CLINICAL DATA:  Vertigo. EXAM: CT HEAD WITHOUT CONTRAST TECHNIQUE: Contiguous axial images were obtained from the base of the skull through the vertex without intravenous contrast. COMPARISON:  None. FINDINGS: Brain: Moderate diffuse cortical atrophy is noted. Mild chronic ischemic white matter disease is noted. Probable old infarction is seen in the pons. Old lacunar infarction is noted in right basal ganglia. No mass effect or midline shift is noted. Ventricular size is within normal limits. There is no evidence of mass lesion, hemorrhage or acute infarction. Vascular: No hyperdense vessel or unexpected calcification. Skull: Normal. Negative for fracture or focal lesion. Sinuses/Orbits:  Mild bilateral ethmoid sinusitis. Other: None. IMPRESSION: Moderate diffuse cortical atrophy. Mild chronic ischemic white matter disease. 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 No acute intracranial abnormality seen. Electronically Signed   By: Lupita RaiderJames  Green Jr, M.D.   On: 01/09/2018 14:25        Scheduled Meds: . pantoprazole  40 mg Oral Daily   Continuous Infusions: . sodium chloride    . azithromycin Stopped (01/09/18 1943)  . lactated ringers 75 mL/hr at 01/10/18 0952     LOS: 0 days    Dron Jaynie CollinsPrasad Bhandari, MD [facility] Pager [phone]  If 7PM-7AM, please contact night-coverage [email or web address] Password [email or web address] 01/10/2018, 3:00 PM

## 2018-01-11 LAB — RENAL FUNCTION PANEL
ANION GAP: 10 (ref 5–15)
Albumin: 2.7 g/dL — ABNORMAL LOW (ref 3.5–5.0)
BUN: 28 mg/dL — ABNORMAL HIGH (ref 6–20)
CALCIUM: 8.1 mg/dL — AB (ref 8.9–10.3)
CHLORIDE: 105 mmol/L (ref 101–111)
CO2: 24 mmol/L (ref 22–32)
CREATININE: 1.62 mg/dL — AB (ref 0.61–1.24)
GFR, EST AFRICAN AMERICAN: 42 mL/min — AB (ref >60)
GFR, EST NON AFRICAN AMERICAN: 36 mL/min — AB (ref >60)
Glucose, Bld: 106 mg/dL — ABNORMAL HIGH (ref 65–99)
Phosphorus: 3 mg/dL (ref 2.5–4.6)
Potassium: 4.3 mmol/L (ref 3.5–5.1)
Sodium: 139 mmol/L (ref 135–145)

## 2018-01-11 LAB — CBC
HCT: 30.2 % — ABNORMAL LOW (ref 39.0–52.0)
HEMOGLOBIN: 10 g/dL — AB (ref 13.0–17.0)
MCH: 30.1 pg (ref 26.0–34.0)
MCHC: 33.1 g/dL (ref 30.0–36.0)
MCV: 91 fL (ref 78.0–100.0)
PLATELETS: 99 10*3/uL — AB (ref 150–400)
RBC: 3.32 MIL/uL — AB (ref 4.22–5.81)
RDW: 13.2 % (ref 11.5–15.5)
WBC: 3 10*3/uL — AB (ref 4.0–10.5)

## 2018-01-11 LAB — BASIC METABOLIC PANEL
Anion gap: 10 (ref 5–15)
BUN: 22 mg/dL — ABNORMAL HIGH (ref 6–20)
CALCIUM: 8.2 mg/dL — AB (ref 8.9–10.3)
CO2: 24 mmol/L (ref 22–32)
CREATININE: 1.52 mg/dL — AB (ref 0.61–1.24)
Chloride: 106 mmol/L (ref 101–111)
GFR calc Af Amer: 45 mL/min — ABNORMAL LOW (ref >60)
GFR calc non Af Amer: 39 mL/min — ABNORMAL LOW (ref >60)
Glucose, Bld: 110 mg/dL — ABNORMAL HIGH (ref 65–99)
Potassium: 3.6 mmol/L (ref 3.5–5.1)
SODIUM: 140 mmol/L (ref 135–145)

## 2018-01-11 MED ORDER — ENSURE ENLIVE PO LIQD: 237.0000 mL | Freq: Three times a day (TID) | ORAL | Status: DC

## 2018-01-11 MED ORDER — ADULT MULTIVITAMIN W/MINERALS CH: 1.0000 | ORAL_TABLET | Freq: Every day | ORAL | Status: DC

## 2018-01-11 MED ORDER — ONDANSETRON HCL 4 MG PO TABS: 4.0000 mg | ORAL_TABLET | Freq: Four times a day (QID) | ORAL | 1 refills | Status: DC | PRN

## 2018-01-11 MED ORDER — ENSURE ENLIVE PO LIQD: 237.0000 mL | Freq: Three times a day (TID) | ORAL | 1 refills | Status: DC

## 2018-01-11 MED ORDER — AZITHROMYCIN 250 MG PO TABS: 250.0000 mg | ORAL_TABLET | Freq: Every day | ORAL | 0 refills | Status: AC

## 2018-01-11 MED ORDER — PANTOPRAZOLE SODIUM 40 MG PO TBEC: 40.0000 mg | DELAYED_RELEASE_TABLET | Freq: Every day | ORAL | 1 refills | Status: DC

## 2018-01-11 MED ORDER — MULTI-VITAMIN/MINERALS PO TABS: 1.0000 | ORAL_TABLET | Freq: Every day | ORAL | 2 refills | Status: AC

## 2018-01-11 NOTE — Progress Notes (Signed)
Patient discharge teaching given, including activity, diet, follow-up appoints, and medications. Patient verbalized understanding of all discharge instructions. IV access was d/c'd. Vitals are stable. Skin is intact except as charted in most recent assessments. Pt to be escorted out by NT, to be driven home by family. 

## 2018-01-11 NOTE — Evaluation (Signed)
Occupational Therapy Evaluation Patient Details Name: Martin Tanner MRN: [id number] DOB: Mar 22, 1928 Today's Date: 01/11/2018    History of Present Illness Yamato Richards Pherigo is a 84 y.o. male admitted on 01/09/2018 with pneumonia   Clinical Impression   Pt st baseline independent level of function and feeling much better, ready to d/c later, family present    Follow Up Recommendations  No OT follow up    Equipment Recommendations  None recommended by OT    Recommendations for Other Services       Precautions / Restrictions Precautions Precautions: None Restrictions Weight Bearing Restrictions: No      Mobility Bed Mobility Overal bed mobility: Independent                Transfers Overall transfer level: Independent                    Balance Overall balance assessment: No apparent balance deficits (not formally assessed)                                         ADL either performed or assessed with clinical judgement   ADL Overall ADL's : At baseline;Independent                                             Vision Baseline Vision/History: Wears glasses Wears Glasses: Reading only Patient Visual Report: No change from baseline       Perception     Praxis      Pertinent Vitals/Pain Pain Assessment: No/denies pain     Hand Dominance Right   Extremity/Trunk Assessment Upper Extremity Assessment Upper Extremity Assessment: Overall WFL for tasks assessed   Lower Extremity Assessment Lower Extremity Assessment: Defer to PT evaluation   Cervical / Trunk Assessment Cervical / Trunk Assessment: Normal   Communication Communication Communication: Prefers language other than [place];Other (comment)(speaks Nepali, family interpreted)   Cognition Arousal/Alertness: Awake/alert Behavior During Therapy: WFL for tasks assessed/performed Overall Cognitive Status: Within Functional Limits for tasks assessed                                     General Comments       Exercises     Shoulder Instructions      Home Living Family/patient expects to be discharged to:: Private residence Living Arrangements: Other relatives Available Help at Discharge: Family Type of Home: House Home Access: Stairs to enter Secretary/administrator of Steps: 2 Entrance Stairs-Rails: Right Home Layout: One level     Bathroom Shower/Tub: Producer, television/film/video: Standard     Home Equipment: Environmental consultant - 2 wheels;Shower seat          Prior Functioning/Environment Level of Independence: Independent                 OT Problem List: Decreased activity tolerance      OT Treatment/Interventions:      OT Goals(Current goals can be found in the care plan section) Acute Rehab OT Goals Patient Stated Goal: go home OT Goal Formulation: All assessment and education complete, DC therapy  OT Frequency:     Barriers to D/C:    no barriers  Co-evaluation              AM-PAC PT "6 Clicks" Daily Activity     Outcome Measure Help from another person eating meals?: None Help from another person taking care of personal grooming?: None Help from another person toileting, which includes using toliet, bedpan, or urinal?: None Help from another person bathing (including washing, rinsing, drying)?: None Help from another person to put on and taking off regular upper body clothing?: None Help from another person to put on and taking off regular lower body clothing?: None 6 Click Score: 24   End of Session    Activity Tolerance: Patient tolerated treatment well Patient left: in bed;with call bell/phone within reach;with family/visitor present(sitting EOB)  OT Visit Diagnosis: Adult, failure to thrive (R62.7)                Time: [id number] OT Time Calculation (min): 16 min Charges:  OT General Charges $OT Visit: 1 Visit OT Evaluation $OT Eval Low Complexity: 1 Low G-Codes:  OT G-codes **NOT FOR INPATIENT CLASS** Functional Assessment Tool Used: AM-PAC 6 Clicks Daily Activity     Galen ManilaSpencer, Shealyn Sean Jeanette 01/11/2018, 12:42 PM

## 2018-01-11 NOTE — Progress Notes (Signed)
PT Cancellation Note  Patient Details Name: Martin Tanner MRN: [id number] DOB: 03/18/1928   Cancelled Treatment:     Attempted to visit with patent, RN states patient has just d/c to home. Per OT, patient returned to baseline function and safe to return home at this time.   Etta GrandchildSean Rashida Ladouceur, PT, DPT Acute Rehab Services Pager: [phone]     Etta GrandchildSean  Tamara Monteith 01/11/2018, 1:31 PM

## 2018-01-11 NOTE — Progress Notes (Signed)
Initial Nutrition Assessment  DOCUMENTATION CODES:   Severe malnutrition in context of chronic illness  INTERVENTION:   Pt at high refeeding risk; recommend monitor K, Mg, and P labs as pt begins to eat more  Ensure Enlive po TID, each supplement provides 350 kcal and 20 grams of protein  Magic cup TID with meals, each supplement provides 290 kcal and 9 grams of protein  MVI daily   Dysphagia 3 diet   NUTRITION DIAGNOSIS:   Severe Malnutrition related to chronic illness(CKD, advanced age ) as evidenced by severe fat depletion, severe muscle depletion.  GOAL:   Patient will meet greater than or equal to 90% of their needs  MONITOR:   PO intake, Supplement acceptance, Labs, Weight trends, I & O's, Skin  REASON FOR ASSESSMENT:   Consult Assessment of nutrition requirement/status  ASSESSMENT:   84 year old male with history of chronic kidney disease stage III, prior GI bleed, bilateral hydronephrosis, BPH, prior admission for nausea vomiting presented with generalized weakness, nausea, vomiting and decreased oral intake.   Visited pt's room today. Unable to obtain nutrition related history r/t the language barrier. Pt appears severely malnourished on exam. Per MD, pt with poor appetite and oral intake pta. Per chart, pt appears to be weight stable. RD will order supplements and MVI to help pt meet his estimated needs. Pt noted to have hiatal hernia on last admit. Pt also noted to have poor dentition; RD will downgrade pt to dysphagia 3 diet. Pt with h/o iron deficiency; recommend check iron, folate, B12, TIBC, and ferritin labs. Pt at high refeeding risk; recommend monitor K, Mg, and P labs as pt begins to eat more.   Medications reviewed and include: protonix, NaCl @100ml /hr, azithromycin  Labs reviewed: BUN 28(H), creat 1.62(H), Ca 8.1(L), P 3.0 wnl, alb 2.7(L) Wbc- 3.0(L), Hct 10.0(L), Hct 30.2(L)  Nutrition-Focused physical exam completed. Findings are severe fat and  muscle depletions over entire body, and severe edema LUE.   Diet Order:  Diet regular Room service appropriate? Yes; Fluid consistency: Thin  EDUCATION NEEDS:   No education needs have been identified at this time  Skin:  Reviewed RN Assessment  Last BM:  PTA  Height:   Ht Readings from Last 1 Encounters:  01/09/18 4\' 10"  (1.473 m)    Weight:   Wt Readings from Last 1 Encounters:  01/11/18 98 lb 5.2 oz (44.6 kg)    Ideal Body Weight:  43.9 kg  BMI:  Body mass index is 20.55 kg/m.  Estimated Nutritional Needs:   Kcal:  1300-1500kcal/day   Protein:  67-76g/day   Fluid:  >1.3L/day   Betsey Holidayasey Varun Jourdan MS, RD, LDN Pager #[phone] After Hours Pager: [phone]

## 2018-01-11 NOTE — Discharge Summary (Signed)
Physician Discharge Summary  Martin Tanner Martin Tanner [id number] DOB: 01/20/1928 DOA: 01/09/2018  PCP: System, Pcp Not In  Admit date: 01/09/2018 Discharge date: 01/11/2018  Admitted From:home Disposition:home  Recommendations for Outpatient Follow-up:  1. Follow up with PCP in 1-2 weeks 2. Please obtain BMP/CBC in one week  Home Health:no Equipment/Devices:none Discharge Condition:stable CODE STATUS:full code Diet recommendation:regular, as tolerated  Brief/Interim Summary: 84 year old male with history of chronic kidney disease stage III, prior GI bleed, bilateral hydronephrosis, BPH, prior admission for nausea vomiting presented with generalized weakness, nausea, vomiting and decreased oral intake.  Denied chest pain, shortness of breath, headache or fever. Chest x-ray with possible atypical pneumonia therefore started on antibiotics and admitted for further evaluation.  #Intractable nausea/vomiting/abdominal pain/dehydration: CT scan of the abdomen pelvis with no acute finding.  Patient symptoms improved.  Able to tolerate diet well.  Prescribed Protonix, Zofran and multivitamin.  Discussed with the patient's grandson.  Recommend to follow-up with PCP and possibly needs GI for further evaluation as an outpatient.    #Severe protein calorie malnutrition: Sent reported no difficulty with swallowing.  Discussed with the patient and family members regarding nutritious diet.  Supplement ordered.  #Atypical pneumonia/community core pneumonia: Patient reported no shortness of breath and not hypoxic.    3 more days of oral azithromycin to complete the course.  #Chronic kidney disease stage III: Serum creatinine level likely around baseline.  Monitor BMP.  #Chronic thrombocytopenia: Monitor CBC.  No sign of bleeding.  #Physical deconditioning: Provide support at home.  Patient lives with his family. Discharge Diagnoses:  Principal Problem:   Community acquired pneumonia Active Problems:  Anemia   Hematemesis   Anemia due to chronic kidney disease   CKD (chronic kidney disease)   Nausea and vomiting   Pneumonia    Discharge Instructions  Discharge Instructions    Call MD for:  difficulty breathing, headache or visual disturbances   Complete by:  As directed    Call MD for:  extreme fatigue   Complete by:  As directed    Call MD for:  hives   Complete by:  As directed    Call MD for:  persistant dizziness or light-headedness   Complete by:  As directed    Call MD for:  persistant nausea and vomiting   Complete by:  As directed    Call MD for:  severe uncontrolled pain   Complete by:  As directed    Call MD for:  temperature >100.4   Complete by:  As directed    Diet general   Complete by:  As directed    Increase activity slowly   Complete by:  As directed      Allergies as of 01/11/2018   No Known Allergies     Medication List    TAKE these medications   acetaminophen 325 MG tablet Commonly known as:  TYLENOL Take 650 mg by mouth every 6 (six) hours as needed.   azithromycin 250 MG tablet Commonly known as:  ZITHROMAX Take 1 tablet (250 mg total) by mouth daily for 3 doses.   feeding supplement (ENSURE ENLIVE) Liqd Take 237 mLs by mouth 3 (three) times daily between meals.   multivitamin with minerals tablet Take 1 tablet by mouth daily.   ondansetron 4 MG tablet Commonly known as:  ZOFRAN Take 1 tablet (4 mg total) by mouth every 6 (six) hours as needed for nausea.   pantoprazole 40 MG tablet Commonly known as:  PROTONIX Take 1 tablet (  40 mg total) by mouth daily.      Follow-up Information    [facility]. Schedule an appointment as soon as possible for a visit in 2 week(s).   Contact information: [address] [place] [address] [phone]         No Known Allergies  Consultations: None  Procedures/Studies: CT scan  Subjective: Seen and examined at bedside.   Reported that he finished his breakfast today.  Denied nausea vomiting or abdominal pain.  No chest pain or shortness of breath.  He denies any difficulty swallowing.  He has chronic generalized weakness.  This provider can speak patient's language, Nepali.  Discharge Exam: Vitals:   01/11/18 0601 01/11/18 0630  BP: 128/65 128/65  Pulse: 80 80  Resp: 18 18  Temp: 98.4 F (36.9 C) 98.4 F (36.9 C)  SpO2: 97% 97%   Vitals:   01/10/18 2053 01/11/18 0500 01/11/18 0601 01/11/18 0630  BP: 113/62  128/65 128/65  Pulse: 79  80 80  Resp: 18  18 18   Temp: 98.5 F (36.9 C)  98.4 F (36.9 C) 98.4 F (36.9 C)  TempSrc: Oral  Oral Oral  SpO2: 95%  97% 97%  Weight:  44.6 kg (98 lb 5.2 oz)    Height:        General: Thin elderly looking male sitting in bed comfortable. Cardiovascular: RRR, S1/S2 +, no rubs, no gallops Respiratory: CTA bilaterally, no wheezing, no rhonchi Abdominal: Soft, NT, ND, bowel sounds + Extremities: no edema, no cyanosis    The results of significant diagnostics from this hospitalization (including imaging, microbiology, ancillary and laboratory) are listed below for reference.     Microbiology: Recent Results (from the past 240 hour(s))  Culture, blood (routine x 2) Call MD if unable to obtain prior to antibiotics being given     Status: None (Preliminary result)   Collection Time: 01/09/18  5:24 PM  Result Value Ref Range Status   Specimen Description BLOOD RIGHT ANTECUBITAL  Final   Special Requests   Final    BOTTLES DRAWN AEROBIC AND ANAEROBIC Blood Culture adequate volume   Culture NO GROWTH < 24 HOURS  Final   Report Status PENDING  Incomplete  Culture, blood (routine x 2) Call MD if unable to obtain prior to antibiotics being given     Status: None (Preliminary result)   Collection Time: 01/09/18  5:30 PM  Result Value Ref Range Status   Specimen Description BLOOD BLOOD RIGHT FOREARM  Final   Special Requests   Final    BOTTLES DRAWN AEROBIC ONLY  Blood Culture adequate volume   Culture NO GROWTH < 12 HOURS  Final   Report Status PENDING  Incomplete     Labs: BNP (last 3 results) No results for input(s): BNP in the last 8760 hours. Basic Metabolic Panel: Recent Labs  Lab 01/09/18 1321 01/10/18 0402 01/11/18 0228 01/11/18 0946  NA 138 140 139 140  K 4.3 4.4 4.3 3.6  CL 104 107 105 106  CO2 22 24 24 24   GLUCOSE 95 74 106* 110*  BUN 12 15 28* 22*  CREATININE 1.26* 1.38* 1.62* 1.52*  CALCIUM 8.5* 8.6* 8.1* 8.2*  PHOS  --   --  3.0  --    Liver Function Tests: Recent Labs  Lab 01/09/18 1321 01/11/18 0228  AST 22  --   ALT 11*  --   ALKPHOS 83  --   BILITOT 1.1  --  PROT 7.2  --   ALBUMIN 3.4* 2.7*   Recent Labs  Lab 01/09/18 1321  LIPASE 25   No results for input(s): AMMONIA in the last 168 hours. CBC: Recent Labs  Lab 01/09/18 1321 01/10/18 0402 01/11/18 0228  WBC 4.7 3.2* 3.0*  NEUTROABS 3.9  --   --   HGB 11.3* 10.4* 10.0*  HCT 34.7* 31.4* 30.2*  MCV 92.8 92.1 91.0  PLT 92* 96* 99*   Cardiac Enzymes: No results for input(s): CKTOTAL, CKMB, CKMBINDEX, TROPONINI in the last 168 hours. BNP: Invalid input(s): POCBNP CBG: No results for input(s): GLUCAP in the last 168 hours. D-Dimer No results for input(s): DDIMER in the last 72 hours. Hgb A1c No results for input(s): HGBA1C in the last 72 hours. Lipid Profile No results for input(s): CHOL, HDL, LDLCALC, TRIG, CHOLHDL, LDLDIRECT in the last 72 hours. Thyroid function studies No results for input(s): TSH, T4TOTAL, T3FREE, THYROIDAB in the last 72 hours.  Invalid input(s): FREET3 Anemia work up No results for input(s): VITAMINB12, FOLATE, FERRITIN, TIBC, IRON, RETICCTPCT in the last 72 hours. Urinalysis    Component Value Date/Time   COLORURINE STRAW (A) 01/09/2018 1349   APPEARANCEUR CLEAR 01/09/2018 1349   LABSPEC 1.009 01/09/2018 1349   PHURINE 7.0 01/09/2018 1349   GLUCOSEU NEGATIVE 01/09/2018 1349   HGBUR SMALL (A) 01/09/2018 1349    BILIRUBINUR NEGATIVE 01/09/2018 1349   BILIRUBINUR neg 02/23/2014 1818   KETONESUR NEGATIVE 01/09/2018 1349   PROTEINUR NEGATIVE 01/09/2018 1349   UROBILINOGEN 0.2 02/23/2014 1818   UROBILINOGEN 0.2 03/31/2013 1700   NITRITE NEGATIVE 01/09/2018 1349   LEUKOCYTESUR NEGATIVE 01/09/2018 1349   Sepsis Labs Invalid input(s): PROCALCITONIN,  WBC,  LACTICIDVEN Microbiology Recent Results (from the past 240 hour(s))  Culture, blood (routine x 2) Call MD if unable to obtain prior to antibiotics being given     Status: None (Preliminary result)   Collection Time: 01/09/18  5:24 PM  Result Value Ref Range Status   Specimen Description BLOOD RIGHT ANTECUBITAL  Final   Special Requests   Final    BOTTLES DRAWN AEROBIC AND ANAEROBIC Blood Culture adequate volume   Culture NO GROWTH < 24 HOURS  Final   Report Status PENDING  Incomplete  Culture, blood (routine x 2) Call MD if unable to obtain prior to antibiotics being given     Status: None (Preliminary result)   Collection Time: 01/09/18  5:30 PM  Result Value Ref Range Status   Specimen Description BLOOD BLOOD RIGHT FOREARM  Final   Special Requests   Final    BOTTLES DRAWN AEROBIC ONLY Blood Culture adequate volume   Culture NO GROWTH < 12 HOURS  Final   Report Status PENDING  Incomplete     Time coordinating discharge: 30 minutes  SIGNED:   Maxie Barb, MD  [facility] 01/11/2018, 11:24 AM  If 7PM-7AM, please contact night-coverage [email or web address] Password [email or web address]

## 2018-01-14 LAB — CULTURE, BLOOD (ROUTINE X 2)
Culture: NO GROWTH
Culture: NO GROWTH
SPECIAL REQUESTS: ADEQUATE
SPECIAL REQUESTS: ADEQUATE

## 2018-12-24 DIAGNOSIS — H539 Unspecified visual disturbance: Secondary | ICD-10-CM | POA: Diagnosis not present

## 2018-12-25 DIAGNOSIS — H539 Unspecified visual disturbance: Secondary | ICD-10-CM | POA: Diagnosis not present

## 2018-12-26 DIAGNOSIS — H539 Unspecified visual disturbance: Secondary | ICD-10-CM | POA: Diagnosis not present

## 2018-12-27 DIAGNOSIS — H539 Unspecified visual disturbance: Secondary | ICD-10-CM | POA: Diagnosis not present

## 2018-12-28 DIAGNOSIS — H539 Unspecified visual disturbance: Secondary | ICD-10-CM | POA: Diagnosis not present

## 2018-12-29 DIAGNOSIS — H539 Unspecified visual disturbance: Secondary | ICD-10-CM | POA: Diagnosis not present

## 2018-12-30 DIAGNOSIS — H539 Unspecified visual disturbance: Secondary | ICD-10-CM | POA: Diagnosis not present

## 2018-12-31 DIAGNOSIS — H539 Unspecified visual disturbance: Secondary | ICD-10-CM | POA: Diagnosis not present

## 2019-01-01 DIAGNOSIS — H539 Unspecified visual disturbance: Secondary | ICD-10-CM | POA: Diagnosis not present

## 2019-01-02 DIAGNOSIS — H539 Unspecified visual disturbance: Secondary | ICD-10-CM | POA: Diagnosis not present

## 2019-01-03 DIAGNOSIS — H539 Unspecified visual disturbance: Secondary | ICD-10-CM | POA: Diagnosis not present

## 2019-01-04 DIAGNOSIS — H539 Unspecified visual disturbance: Secondary | ICD-10-CM | POA: Diagnosis not present

## 2019-01-05 DIAGNOSIS — H539 Unspecified visual disturbance: Secondary | ICD-10-CM | POA: Diagnosis not present

## 2019-01-06 DIAGNOSIS — H539 Unspecified visual disturbance: Secondary | ICD-10-CM | POA: Diagnosis not present

## 2019-01-07 DIAGNOSIS — H539 Unspecified visual disturbance: Secondary | ICD-10-CM | POA: Diagnosis not present

## 2019-01-08 DIAGNOSIS — H539 Unspecified visual disturbance: Secondary | ICD-10-CM | POA: Diagnosis not present

## 2019-01-09 DIAGNOSIS — H539 Unspecified visual disturbance: Secondary | ICD-10-CM | POA: Diagnosis not present

## 2019-01-10 DIAGNOSIS — H539 Unspecified visual disturbance: Secondary | ICD-10-CM | POA: Diagnosis not present

## 2019-01-11 DIAGNOSIS — H539 Unspecified visual disturbance: Secondary | ICD-10-CM | POA: Diagnosis not present

## 2019-01-12 DIAGNOSIS — H539 Unspecified visual disturbance: Secondary | ICD-10-CM | POA: Diagnosis not present

## 2019-01-13 DIAGNOSIS — H539 Unspecified visual disturbance: Secondary | ICD-10-CM | POA: Diagnosis not present

## 2019-01-14 DIAGNOSIS — H539 Unspecified visual disturbance: Secondary | ICD-10-CM | POA: Diagnosis not present

## 2019-01-15 DIAGNOSIS — H539 Unspecified visual disturbance: Secondary | ICD-10-CM | POA: Diagnosis not present

## 2019-01-16 DIAGNOSIS — H539 Unspecified visual disturbance: Secondary | ICD-10-CM | POA: Diagnosis not present

## 2019-01-17 DIAGNOSIS — H539 Unspecified visual disturbance: Secondary | ICD-10-CM | POA: Diagnosis not present

## 2019-01-18 DIAGNOSIS — H539 Unspecified visual disturbance: Secondary | ICD-10-CM | POA: Diagnosis not present

## 2019-01-19 DIAGNOSIS — H539 Unspecified visual disturbance: Secondary | ICD-10-CM | POA: Diagnosis not present

## 2019-01-20 DIAGNOSIS — H539 Unspecified visual disturbance: Secondary | ICD-10-CM | POA: Diagnosis not present

## 2019-01-21 DIAGNOSIS — H539 Unspecified visual disturbance: Secondary | ICD-10-CM | POA: Diagnosis not present

## 2019-01-22 DIAGNOSIS — H539 Unspecified visual disturbance: Secondary | ICD-10-CM | POA: Diagnosis not present

## 2019-01-23 DIAGNOSIS — H539 Unspecified visual disturbance: Secondary | ICD-10-CM | POA: Diagnosis not present

## 2019-01-24 DIAGNOSIS — H539 Unspecified visual disturbance: Secondary | ICD-10-CM | POA: Diagnosis not present

## 2019-01-25 DIAGNOSIS — H539 Unspecified visual disturbance: Secondary | ICD-10-CM | POA: Diagnosis not present

## 2019-01-26 DIAGNOSIS — H539 Unspecified visual disturbance: Secondary | ICD-10-CM | POA: Diagnosis not present

## 2019-01-27 DIAGNOSIS — H539 Unspecified visual disturbance: Secondary | ICD-10-CM | POA: Diagnosis not present

## 2019-01-28 DIAGNOSIS — H539 Unspecified visual disturbance: Secondary | ICD-10-CM | POA: Diagnosis not present

## 2019-01-29 DIAGNOSIS — H539 Unspecified visual disturbance: Secondary | ICD-10-CM | POA: Diagnosis not present

## 2019-01-30 DIAGNOSIS — H539 Unspecified visual disturbance: Secondary | ICD-10-CM | POA: Diagnosis not present

## 2019-01-31 DIAGNOSIS — H539 Unspecified visual disturbance: Secondary | ICD-10-CM | POA: Diagnosis not present

## 2019-02-01 DIAGNOSIS — H539 Unspecified visual disturbance: Secondary | ICD-10-CM | POA: Diagnosis not present

## 2019-02-02 DIAGNOSIS — H539 Unspecified visual disturbance: Secondary | ICD-10-CM | POA: Diagnosis not present

## 2019-02-03 DIAGNOSIS — H539 Unspecified visual disturbance: Secondary | ICD-10-CM | POA: Diagnosis not present

## 2019-02-04 DIAGNOSIS — H539 Unspecified visual disturbance: Secondary | ICD-10-CM | POA: Diagnosis not present

## 2019-02-05 ENCOUNTER — Ambulatory Visit: Payer: Medicaid Other | Admitting: Family Medicine

## 2019-02-05 DIAGNOSIS — H539 Unspecified visual disturbance: Secondary | ICD-10-CM | POA: Diagnosis not present

## 2019-03-09 DIAGNOSIS — H539 Unspecified visual disturbance: Secondary | ICD-10-CM | POA: Diagnosis not present

## 2019-03-10 DIAGNOSIS — H539 Unspecified visual disturbance: Secondary | ICD-10-CM | POA: Diagnosis not present

## 2019-03-11 DIAGNOSIS — H539 Unspecified visual disturbance: Secondary | ICD-10-CM | POA: Diagnosis not present

## 2019-03-12 DIAGNOSIS — H539 Unspecified visual disturbance: Secondary | ICD-10-CM | POA: Diagnosis not present

## 2019-03-13 DIAGNOSIS — H539 Unspecified visual disturbance: Secondary | ICD-10-CM | POA: Diagnosis not present

## 2019-03-14 DIAGNOSIS — H539 Unspecified visual disturbance: Secondary | ICD-10-CM | POA: Diagnosis not present

## 2019-03-15 DIAGNOSIS — H539 Unspecified visual disturbance: Secondary | ICD-10-CM | POA: Diagnosis not present

## 2019-03-16 DIAGNOSIS — H539 Unspecified visual disturbance: Secondary | ICD-10-CM | POA: Diagnosis not present

## 2019-03-17 DIAGNOSIS — H539 Unspecified visual disturbance: Secondary | ICD-10-CM | POA: Diagnosis not present

## 2019-03-18 DIAGNOSIS — H539 Unspecified visual disturbance: Secondary | ICD-10-CM | POA: Diagnosis not present

## 2019-03-19 DIAGNOSIS — H539 Unspecified visual disturbance: Secondary | ICD-10-CM | POA: Diagnosis not present

## 2019-03-20 DIAGNOSIS — H539 Unspecified visual disturbance: Secondary | ICD-10-CM | POA: Diagnosis not present

## 2019-03-21 DIAGNOSIS — H539 Unspecified visual disturbance: Secondary | ICD-10-CM | POA: Diagnosis not present

## 2019-03-22 DIAGNOSIS — H539 Unspecified visual disturbance: Secondary | ICD-10-CM | POA: Diagnosis not present

## 2019-03-23 DIAGNOSIS — H539 Unspecified visual disturbance: Secondary | ICD-10-CM | POA: Diagnosis not present

## 2019-03-24 DIAGNOSIS — H539 Unspecified visual disturbance: Secondary | ICD-10-CM | POA: Diagnosis not present

## 2019-03-25 DIAGNOSIS — H539 Unspecified visual disturbance: Secondary | ICD-10-CM | POA: Diagnosis not present

## 2019-03-26 DIAGNOSIS — H539 Unspecified visual disturbance: Secondary | ICD-10-CM | POA: Diagnosis not present

## 2019-03-27 DIAGNOSIS — H539 Unspecified visual disturbance: Secondary | ICD-10-CM | POA: Diagnosis not present

## 2019-03-28 DIAGNOSIS — H539 Unspecified visual disturbance: Secondary | ICD-10-CM | POA: Diagnosis not present

## 2019-03-29 DIAGNOSIS — H539 Unspecified visual disturbance: Secondary | ICD-10-CM | POA: Diagnosis not present

## 2019-03-30 DIAGNOSIS — H539 Unspecified visual disturbance: Secondary | ICD-10-CM | POA: Diagnosis not present

## 2019-03-31 DIAGNOSIS — H539 Unspecified visual disturbance: Secondary | ICD-10-CM | POA: Diagnosis not present

## 2019-04-01 DIAGNOSIS — H539 Unspecified visual disturbance: Secondary | ICD-10-CM | POA: Diagnosis not present

## 2019-04-02 DIAGNOSIS — H539 Unspecified visual disturbance: Secondary | ICD-10-CM | POA: Diagnosis not present

## 2019-04-03 DIAGNOSIS — H539 Unspecified visual disturbance: Secondary | ICD-10-CM | POA: Diagnosis not present

## 2019-04-04 DIAGNOSIS — H539 Unspecified visual disturbance: Secondary | ICD-10-CM | POA: Diagnosis not present

## 2019-04-05 DIAGNOSIS — H539 Unspecified visual disturbance: Secondary | ICD-10-CM | POA: Diagnosis not present

## 2019-04-06 DIAGNOSIS — H539 Unspecified visual disturbance: Secondary | ICD-10-CM | POA: Diagnosis not present

## 2019-04-07 DIAGNOSIS — H539 Unspecified visual disturbance: Secondary | ICD-10-CM | POA: Diagnosis not present

## 2019-04-08 DIAGNOSIS — H539 Unspecified visual disturbance: Secondary | ICD-10-CM | POA: Diagnosis not present

## 2019-04-09 DIAGNOSIS — H539 Unspecified visual disturbance: Secondary | ICD-10-CM | POA: Diagnosis not present

## 2019-04-10 DIAGNOSIS — H539 Unspecified visual disturbance: Secondary | ICD-10-CM | POA: Diagnosis not present

## 2019-04-11 DIAGNOSIS — H539 Unspecified visual disturbance: Secondary | ICD-10-CM | POA: Diagnosis not present

## 2019-04-12 DIAGNOSIS — H539 Unspecified visual disturbance: Secondary | ICD-10-CM | POA: Diagnosis not present

## 2019-04-13 DIAGNOSIS — H539 Unspecified visual disturbance: Secondary | ICD-10-CM | POA: Diagnosis not present

## 2019-04-14 DIAGNOSIS — H539 Unspecified visual disturbance: Secondary | ICD-10-CM | POA: Diagnosis not present

## 2019-04-15 DIAGNOSIS — H539 Unspecified visual disturbance: Secondary | ICD-10-CM | POA: Diagnosis not present

## 2019-04-16 DIAGNOSIS — H539 Unspecified visual disturbance: Secondary | ICD-10-CM | POA: Diagnosis not present

## 2019-04-17 DIAGNOSIS — H539 Unspecified visual disturbance: Secondary | ICD-10-CM | POA: Diagnosis not present

## 2019-04-18 DIAGNOSIS — H539 Unspecified visual disturbance: Secondary | ICD-10-CM | POA: Diagnosis not present

## 2019-04-19 DIAGNOSIS — H539 Unspecified visual disturbance: Secondary | ICD-10-CM | POA: Diagnosis not present

## 2019-04-20 DIAGNOSIS — H539 Unspecified visual disturbance: Secondary | ICD-10-CM | POA: Diagnosis not present

## 2019-04-21 DIAGNOSIS — H539 Unspecified visual disturbance: Secondary | ICD-10-CM | POA: Diagnosis not present

## 2019-04-22 DIAGNOSIS — H539 Unspecified visual disturbance: Secondary | ICD-10-CM | POA: Diagnosis not present

## 2019-04-23 DIAGNOSIS — H539 Unspecified visual disturbance: Secondary | ICD-10-CM | POA: Diagnosis not present

## 2019-04-24 DIAGNOSIS — H539 Unspecified visual disturbance: Secondary | ICD-10-CM | POA: Diagnosis not present

## 2019-04-25 DIAGNOSIS — H539 Unspecified visual disturbance: Secondary | ICD-10-CM | POA: Diagnosis not present

## 2019-04-26 DIAGNOSIS — H539 Unspecified visual disturbance: Secondary | ICD-10-CM | POA: Diagnosis not present

## 2019-04-27 DIAGNOSIS — H539 Unspecified visual disturbance: Secondary | ICD-10-CM | POA: Diagnosis not present

## 2019-04-28 DIAGNOSIS — H539 Unspecified visual disturbance: Secondary | ICD-10-CM | POA: Diagnosis not present

## 2019-04-29 DIAGNOSIS — H539 Unspecified visual disturbance: Secondary | ICD-10-CM | POA: Diagnosis not present

## 2019-04-30 DIAGNOSIS — H539 Unspecified visual disturbance: Secondary | ICD-10-CM | POA: Diagnosis not present

## 2019-05-01 DIAGNOSIS — H539 Unspecified visual disturbance: Secondary | ICD-10-CM | POA: Diagnosis not present

## 2019-05-02 DIAGNOSIS — H539 Unspecified visual disturbance: Secondary | ICD-10-CM | POA: Diagnosis not present

## 2019-05-03 DIAGNOSIS — H539 Unspecified visual disturbance: Secondary | ICD-10-CM | POA: Diagnosis not present

## 2019-05-04 DIAGNOSIS — H539 Unspecified visual disturbance: Secondary | ICD-10-CM | POA: Diagnosis not present

## 2019-05-05 DIAGNOSIS — H539 Unspecified visual disturbance: Secondary | ICD-10-CM | POA: Diagnosis not present

## 2019-05-06 DIAGNOSIS — H539 Unspecified visual disturbance: Secondary | ICD-10-CM | POA: Diagnosis not present

## 2019-05-07 DIAGNOSIS — H539 Unspecified visual disturbance: Secondary | ICD-10-CM | POA: Diagnosis not present

## 2019-05-08 ENCOUNTER — Emergency Department (HOSPITAL_COMMUNITY)
Admission: EM | Admit: 2019-05-08 | Discharge: 2019-05-08 | Disposition: A | Discharge status: Home / Self Care | Payer: Medicaid Other | Attending: Emergency Medicine | Admitting: Emergency Medicine

## 2019-05-08 ENCOUNTER — Other Ambulatory Visit: Payer: Self-pay

## 2019-05-08 ENCOUNTER — Encounter (HOSPITAL_COMMUNITY): Payer: Self-pay | Admitting: Emergency Medicine

## 2019-05-08 ENCOUNTER — Emergency Department (HOSPITAL_COMMUNITY): Payer: Medicaid Other

## 2019-05-08 DIAGNOSIS — R531 Weakness: Secondary | ICD-10-CM | POA: Diagnosis not present

## 2019-05-08 DIAGNOSIS — R404 Transient alteration of awareness: Secondary | ICD-10-CM | POA: Diagnosis not present

## 2019-05-08 DIAGNOSIS — U071 COVID-19: Secondary | ICD-10-CM

## 2019-05-08 DIAGNOSIS — J984 Other disorders of lung: Secondary | ICD-10-CM | POA: Diagnosis not present

## 2019-05-08 DIAGNOSIS — F17210 Nicotine dependence, cigarettes, uncomplicated: Secondary | ICD-10-CM | POA: Insufficient documentation

## 2019-05-08 DIAGNOSIS — R300 Dysuria: Secondary | ICD-10-CM | POA: Insufficient documentation

## 2019-05-08 DIAGNOSIS — N189 Chronic kidney disease, unspecified: Secondary | ICD-10-CM | POA: Insufficient documentation

## 2019-05-08 DIAGNOSIS — H539 Unspecified visual disturbance: Secondary | ICD-10-CM | POA: Diagnosis not present

## 2019-05-08 DIAGNOSIS — R112 Nausea with vomiting, unspecified: Secondary | ICD-10-CM | POA: Insufficient documentation

## 2019-05-08 DIAGNOSIS — Z79899 Other long term (current) drug therapy: Secondary | ICD-10-CM | POA: Insufficient documentation

## 2019-05-08 DIAGNOSIS — R509 Fever, unspecified: Secondary | ICD-10-CM | POA: Diagnosis not present

## 2019-05-08 DIAGNOSIS — R0902 Hypoxemia: Secondary | ICD-10-CM | POA: Diagnosis not present

## 2019-05-08 LAB — SARS CORONAVIRUS 2 BY RT PCR (HOSPITAL ORDER, PERFORMED IN ~~LOC~~ HOSPITAL LAB): SARS Coronavirus 2: POSITIVE — AB

## 2019-05-08 LAB — CBC WITH DIFFERENTIAL/PLATELET
Abs Immature Granulocytes: 0.01 10*3/uL (ref 0.00–0.07)
Basophils Absolute: 0 10*3/uL (ref 0.0–0.1)
Basophils Relative: 1 %
Eosinophils Absolute: 0 10*3/uL (ref 0.0–0.5)
Eosinophils Relative: 0 %
HCT: 36.1 % — ABNORMAL LOW (ref 39.0–52.0)
Hemoglobin: 12.3 g/dL — ABNORMAL LOW (ref 13.0–17.0)
Immature Granulocytes: 1 %
Lymphocytes Relative: 22 %
Lymphs Abs: 0.5 10*3/uL — ABNORMAL LOW (ref 0.7–4.0)
MCH: 30.2 pg (ref 26.0–34.0)
MCHC: 34.1 g/dL (ref 30.0–36.0)
MCV: 88.7 fL (ref 80.0–100.0)
Monocytes Absolute: 0.2 10*3/uL (ref 0.1–1.0)
Monocytes Relative: 8 %
Neutro Abs: 1.4 10*3/uL — ABNORMAL LOW (ref 1.7–7.7)
Neutrophils Relative %: 68 %
Platelets: DECREASED 10*3/uL (ref 150–400)
RBC: 4.07 MIL/uL — ABNORMAL LOW (ref 4.22–5.81)
RDW: 13.8 % (ref 11.5–15.5)
WBC: 2.1 10*3/uL — ABNORMAL LOW (ref 4.0–10.5)
nRBC: 0 % (ref 0.0–0.2)

## 2019-05-08 LAB — URINALYSIS, ROUTINE W REFLEX MICROSCOPIC
Bilirubin Urine: NEGATIVE
Glucose, UA: NEGATIVE mg/dL
Ketones, ur: 5 mg/dL — AB
Leukocytes,Ua: NEGATIVE
Nitrite: NEGATIVE
Protein, ur: 100 mg/dL — AB
Specific Gravity, Urine: 1.016 (ref 1.005–1.030)
pH: 5 (ref 5.0–8.0)

## 2019-05-08 LAB — COMPREHENSIVE METABOLIC PANEL
ALT: 20 U/L (ref 0–44)
AST: 56 U/L — ABNORMAL HIGH (ref 15–41)
Albumin: 3.3 g/dL — ABNORMAL LOW (ref 3.5–5.0)
Alkaline Phosphatase: 65 U/L (ref 38–126)
Anion gap: 10 (ref 5–15)
BUN: 24 mg/dL — ABNORMAL HIGH (ref 8–23)
CO2: 21 mmol/L — ABNORMAL LOW (ref 22–32)
Calcium: 8.6 mg/dL — ABNORMAL LOW (ref 8.9–10.3)
Chloride: 105 mmol/L (ref 98–111)
Creatinine, Ser: 1.57 mg/dL — ABNORMAL HIGH (ref 0.61–1.24)
GFR calc Af Amer: 45 mL/min — ABNORMAL LOW (ref >60)
GFR calc non Af Amer: 39 mL/min — ABNORMAL LOW (ref >60)
Glucose, Bld: 94 mg/dL (ref 70–99)
Potassium: 5 mmol/L (ref 3.5–5.1)
Sodium: 136 mmol/L (ref 135–145)
Total Bilirubin: 1.1 mg/dL (ref 0.3–1.2)
Total Protein: 7.6 g/dL (ref 6.5–8.1)

## 2019-05-08 LAB — LACTIC ACID, PLASMA: Lactic Acid, Venous: 1.3 mmol/L (ref 0.5–1.9)

## 2019-05-08 MED ORDER — LACTATED RINGERS IV BOLUS
1000.0000 mL | Freq: Once | INTRAVENOUS | Status: AC
  Administered 2019-05-08: 14:00:00 1000 mL via INTRAVENOUS

## 2019-05-08 MED ORDER — ACETAMINOPHEN 650 MG RE SUPP
650.0000 mg | Freq: Once | RECTAL | Status: AC
  Administered 2019-05-08: 650 mg via RECTAL
  Filled 2019-05-08: qty 1

## 2019-05-08 NOTE — ED Provider Notes (Signed)
6:21 PM Patient continues to look good.  Normal vital signs.  No increased work of breathing.  O2 sats are 99% on room air.  Urinalysis demonstrates no signs of infection.  At this time I do not think the patient needs acute hospitalization.  He likely would be at heightened risk in the hospital than they would be at home.  I discussed this case with his grandson.  Grandson understands the importance of self quarantine and isolation from other family members.  Instructed to return to the emergency department for new or worsening symptoms      Azalia Bilis, MD 05/08/19 Rickey Primus

## 2019-05-08 NOTE — ED Triage Notes (Signed)
Pt BIB [facility] from home complaining of weakness and fever for the last 5 days. Pt family states that pt have episode of n/v/d this morning. Pt temperature with EMS was 103. Pt needs interpreter.

## 2019-05-08 NOTE — ED Notes (Signed)
Patient given water and sandwich. 

## 2019-05-08 NOTE — ED Notes (Signed)
Nurse Navigator communication: Per primary RN, Dr. Lockie Mola has been in contact with the son of the patient.

## 2019-05-08 NOTE — ED Notes (Signed)
Nurse Navigator communication: Martin Tanner, the pts grandson has been provided update and understands the patient may be discharged soon. Contact number is [phone]

## 2019-05-08 NOTE — ED Notes (Signed)
While using the interpreter patient denies any pain at this time. States he may have some urinary issues.

## 2019-05-08 NOTE — ED Notes (Signed)
Pt states he had not eaten or drank anything in 2 days.

## 2019-05-08 NOTE — ED Provider Notes (Signed)
[facility] EMERGENCY DEPARTMENT Provider Note   CSN: [id number] Arrival date & time: 05/08/19  1239    History   Chief Complaint Chief Complaint  Patient presents with  . Fever  . Weakness    HPI Martin Tanner is a 85 y.o. male.     The history is provided by the patient and a relative. A language interpreter was used.  Fever  Max temp prior to arrival:  101 Temp source:  Oral Severity:  Mild Onset quality:  Gradual Duration:  5 days Timing:  Intermittent Progression:  Waxing and waning Chronicity:  New Relieved by:  Nothing Worsened by:  Nothing Associated symptoms: chills, dysuria, nausea and vomiting   Associated symptoms: no chest pain, no cough, no ear pain, no rash and no sore throat     Past Medical History:  Diagnosis Date  . Anemia   . BPH (benign prostatic hyperplasia)   . Chronic kidney disease   . Upper GI bleed 11/17/2017    Patient Active Problem List   Diagnosis Date Noted  . Community acquired pneumonia 01/09/2018  . Nausea and vomiting 01/09/2018  . Pneumonia 01/09/2018  . Anemia due to chronic kidney disease 11/18/2017  . CKD (chronic kidney disease) 11/18/2017  . 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 Hematemesis 11/17/2017  . Hyponatremia 04/01/2013  . Pyelonephritis 01/28/2013  . Metabolic acidosis 01/28/2013  . Thrombocytopenia (HCC) 01/28/2013  . UTI (lower urinary tract infection) 01/25/2013  . Urinary retention 01/25/2013  . Anemia 01/25/2013  . Weakness generalized 01/25/2013    Past Surgical History:  Procedure Laterality Date  . ESOPHAGOGASTRODUODENOSCOPY (EGD) WITH PROPOFOL N/A 11/18/2017   Procedure: ESOPHAGOGASTRODUODENOSCOPY (EGD) WITH PROPOFOL;  Surgeon: Vida Rigger, MD;  Location: [facility] ENDOSCOPY;  Service: Endoscopy;  Laterality: N/A;  . None    . TRANSURETHRAL RESECTION OF PROSTATE N/A 03/16/2013   Procedure: TRANSURETHRAL RESECTION OF THE PROSTATE WITH GYRUS INSTRUMENTS;  Surgeon: Marcine Matar, MD;   Location: [facility] ORS;  Service: Urology;  Laterality: N/A;        Home Medications    Prior to Admission medications   Medication Sig Start Date End Date Taking? Authorizing Provider  acetaminophen (TYLENOL) 325 MG tablet Take 650 mg by mouth every 6 (six) hours as needed.    [provider]  feeding supplement, ENSURE ENLIVE, (ENSURE ENLIVE) LIQD Take 237 mLs by mouth 3 (three) times daily between meals. 01/11/18   Maxie Barb, MD  ondansetron (ZOFRAN) 4 MG tablet Take 1 tablet (4 mg total) by mouth every 6 (six) hours as needed for nausea. 01/11/18   Maxie Barb, MD  pantoprazole (PROTONIX) 40 MG tablet Take 1 tablet (40 mg total) by mouth daily. 01/11/18   Maxie Barb, MD    Family History Family History  Problem Relation Age of Onset  . Other Other        Negative CAD  . Other Other        Negative DM2  . Other Other        Negative HTN  . Other Other        Negative Cancer    Social History Social History   Tobacco Use  . Smoking status: Current Every Day Smoker    Packs/day: 0.25    Years: 62.00    Pack years: 15.50    Types: Cigarettes  . Smokeless tobacco: Never Used  Substance Use Topics  . Alcohol use: No    Alcohol/week: 0.0 standard drinks  .  Drug use: No     Allergies   Patient has no known allergies.   Review of Systems Review of Systems  Constitutional: Positive for chills and fever.  HENT: Negative for ear pain and sore throat.   Eyes: Negative for pain and visual disturbance.  Respiratory: Negative for cough and shortness of breath.   Cardiovascular: Negative for chest pain and palpitations.  Gastrointestinal: Positive for nausea and vomiting. Negative for abdominal pain.  Genitourinary: Positive for dysuria. Negative for flank pain and hematuria.  Musculoskeletal: Negative for arthralgias and back pain.  Skin: Negative for color change and rash.  Neurological: Negative for seizures and syncope.  All other  systems reviewed and are negative.    Physical Exam Updated Vital Signs  ED Triage Vitals [05/08/19 1247]  Enc Vitals Group     BP (!) 132/92     Pulse Rate 78     Resp 20     Temp (!) 102.9 F (39.4 C)     Temp Source Rectal     SpO2 98 %     Weight      Height      Head Circumference      Peak Flow      Pain Score      Pain Loc      Pain Edu?      Excl. in GC?     Physical Exam Vitals signs and nursing note reviewed.  Constitutional:      General: He is not in acute distress.    Appearance: He is well-developed. He is not ill-appearing.  HENT:     Head: Normocephalic and atraumatic.     Mouth/Throat:     Mouth: Mucous membranes are dry.  Eyes:     Extraocular Movements: Extraocular movements intact.     Conjunctiva/sclera: Conjunctivae normal.     Pupils: Pupils are equal, round, and reactive to light.  Neck:     Musculoskeletal: Normal range of motion and neck supple.  Cardiovascular:     Rate and Rhythm: Normal rate and regular rhythm.     Pulses: Normal pulses.     Heart sounds: Normal heart sounds. No murmur.  Pulmonary:     Effort: Pulmonary effort is normal. No respiratory distress.     Breath sounds: Normal breath sounds.  Abdominal:     General: There is no distension.     Palpations: Abdomen is soft.     Tenderness: There is no abdominal tenderness.  Musculoskeletal: Normal range of motion.  Skin:    General: Skin is warm and dry.     Capillary Refill: Capillary refill takes less than 2 seconds.  Neurological:     General: No focal deficit present.     Mental Status: He is alert. Mental status is at baseline.  Psychiatric:        Mood and Affect: Mood normal.      ED Treatments / Results  Labs (all labs ordered are listed, but only abnormal results are displayed) Labs Reviewed  SARS CORONAVIRUS 2 (HOSPITAL ORDER, PERFORMED IN [place] [facility]) - Abnormal; Notable for the following components:      Result Value   SARS  Coronavirus 2 POSITIVE (*)    All other components within normal limits  COMPREHENSIVE METABOLIC PANEL - Abnormal; Notable for the following components:   CO2 21 (*)    BUN 24 (*)    Creatinine, Ser 1.57 (*)    Calcium 8.6 (*)  Albumin 3.3 (*)    AST 56 (*)    GFR calc non Af Amer 39 (*)    GFR calc Af Amer 45 (*)    All other components within normal limits  CBC WITH DIFFERENTIAL/PLATELET - Abnormal; Notable for the following components:   WBC 2.1 (*)    RBC 4.07 (*)    Hemoglobin 12.3 (*)    HCT 36.1 (*)    Neutro Abs 1.4 (*)    Lymphs Abs 0.5 (*)    All other components within normal limits  CULTURE, BLOOD (ROUTINE X 2)  CULTURE, BLOOD (ROUTINE X 2)  URINE CULTURE  LACTIC ACID, PLASMA  LACTIC ACID, PLASMA  URINALYSIS, ROUTINE W REFLEX MICROSCOPIC    EKG EKG Interpretation  Date/Time:  Friday May 08 2019 13:15:23 EDT Ventricular Rate:  84 PR Interval:    QRS Duration: 89 QT Interval:  350 QTC Calculation: 414 R Axis:   66 Text Interpretation:  Sinus rhythm Confirmed by Virgina NorfolkAdam, Batul Diego [phone]([id number]) on 05/08/2019 1:18:54 PM   Radiology Dg Chest Port 1 View  Result Date: 05/08/2019 CLINICAL DATA:  Fevers EXAM: PORTABLE CHEST 1 VIEW COMPARISON:  01/09/2018 FINDINGS: Cardiac shadow is stable. Aortic calcifications are noted. The lungs are well aerated bilaterally. No focal infiltrate is seen. Mild increased interstitial markings are noted stable from the prior exam. This may represent mild edema. No bony abnormality is seen. IMPRESSION: Stable mild interstitial changes which may represent edema. Electronically Signed   By: Alcide CleverMark  Lukens M.D.   On: 05/08/2019 14:44    Procedures Procedures (including critical care time)  Medications Ordered in ED Medications  acetaminophen (TYLENOL) suppository 650 mg (650 mg Rectal Given 05/08/19 1311)  lactated ringers bolus 1,000 mL (1,000 mLs Intravenous New Bag/Given 05/08/19 1333)     Initial Impression / Assessment and Plan / ED  Course  I have reviewed the triage vital signs and the nursing notes.  Pertinent labs & imaging results that were available during my care of the patient were reviewed by me and considered in my medical decision making (see chart for details).     Martin Tanner is an 85 year old male with history of BPH, CKD who presents to the ED with fever, pain with urination.  Patient with fever but otherwise normal vitals.  Patient has had nausea and vomiting for 2 days and has not had anything to eat or drink during that time as well.  I talk with the son on the phone he states that patient has had fever and weakness for 5 days.  Concern for urinary tract infection, dehydration.  Patient denies any abdominal pain, chest pain, shortness of breath, cough.  Overall patient appears dehydrated.  Dry mucous membranes.  No abdominal tenderness on exam.  Suspect urinary source.  Will give IV fluids, Tylenol and initiate infectious work-up with urine studies, chest x-ray, coronavirus testing, blood cultures.  Patient positive for coronavirus.  Has leukopenia with a white count of 2.1.  Otherwise no significant anemia, electrolyte abnormality.  Creatinine is at baseline.  Lactic acid is normal.  Chest x-ray overall, has stable interstitial changes.  No opacities.  Patient with EKG that shows sinus rhythm.  Patient overall with no signs of respiratory distress.  Awaiting urinalysis.  Contacted family and made them aware that patient is positive for coronavirus.  Educated about self isolation.  Patient has no signs of respiratory distress.  Has had mostly GI symptoms.  Talked with family that patient will likely be discharged  to home and will need home quarantine.  Patient appears to be stable at this time and stated to family that if he shows any signs of respiratory distress he does need to come back to the ED for evaluation.  Patient was handed off to Dr. Patria Mane with patient awaiting urinalysis.  Likely patient will be okay  for discharge to home, no concern for sepsis.  This chart was dictated using voice recognition software.  Despite best efforts to proofread,  errors can occur which can change the documentation meaning.    Final Clinical Impressions(s) / ED Diagnoses   Final diagnoses:  COVID-19 virus infection    ED Discharge Orders    None       Virgina Norfolk, DO 05/08/19 1626

## 2019-05-08 NOTE — ED Notes (Signed)
Family verbalizes understanding of discharge instructions. Opportunity for questioning and answers were provided. Armband removed by staff, pt discharged from ED.

## 2019-05-08 NOTE — ED Notes (Signed)
X-ray at bedside

## 2019-05-09 DIAGNOSIS — H539 Unspecified visual disturbance: Secondary | ICD-10-CM | POA: Diagnosis not present

## 2019-05-09 LAB — URINE CULTURE: Culture: NO GROWTH

## 2019-05-10 DIAGNOSIS — H539 Unspecified visual disturbance: Secondary | ICD-10-CM | POA: Diagnosis not present

## 2019-05-11 DIAGNOSIS — H539 Unspecified visual disturbance: Secondary | ICD-10-CM | POA: Diagnosis not present

## 2019-05-12 ENCOUNTER — Other Ambulatory Visit: Payer: Self-pay

## 2019-05-12 ENCOUNTER — Inpatient Hospital Stay (HOSPITAL_COMMUNITY)
Admission: EM | Admit: 2019-05-12 | Discharge: 2019-05-21 | DRG: 177 | Disposition: A | Discharge status: Skilled Nursing Facility | Payer: Medicaid Other | Attending: Internal Medicine | Admitting: Internal Medicine

## 2019-05-12 ENCOUNTER — Emergency Department (HOSPITAL_COMMUNITY): Payer: Medicaid Other

## 2019-05-12 ENCOUNTER — Encounter (HOSPITAL_COMMUNITY): Payer: Self-pay

## 2019-05-12 DIAGNOSIS — F17210 Nicotine dependence, cigarettes, uncomplicated: Secondary | ICD-10-CM | POA: Diagnosis not present

## 2019-05-12 DIAGNOSIS — G20 Parkinson's disease: Secondary | ICD-10-CM | POA: Diagnosis not present

## 2019-05-12 DIAGNOSIS — H539 Unspecified visual disturbance: Secondary | ICD-10-CM | POA: Diagnosis not present

## 2019-05-12 DIAGNOSIS — Z681 Body mass index (BMI) 19 or less, adult: Secondary | ICD-10-CM | POA: Diagnosis not present

## 2019-05-12 DIAGNOSIS — R7989 Other specified abnormal findings of blood chemistry: Secondary | ICD-10-CM | POA: Diagnosis not present

## 2019-05-12 DIAGNOSIS — R32 Unspecified urinary incontinence: Secondary | ICD-10-CM | POA: Diagnosis not present

## 2019-05-12 DIAGNOSIS — G960 Cerebrospinal fluid leak: Secondary | ICD-10-CM | POA: Diagnosis not present

## 2019-05-12 DIAGNOSIS — R1312 Dysphagia, oropharyngeal phase: Secondary | ICD-10-CM | POA: Diagnosis not present

## 2019-05-12 DIAGNOSIS — N401 Enlarged prostate with lower urinary tract symptoms: Secondary | ICD-10-CM | POA: Diagnosis not present

## 2019-05-12 DIAGNOSIS — N183 Chronic kidney disease, stage 3 (moderate): Secondary | ICD-10-CM | POA: Diagnosis present

## 2019-05-12 DIAGNOSIS — Z91018 Allergy to other foods: Secondary | ICD-10-CM

## 2019-05-12 DIAGNOSIS — I10 Essential (primary) hypertension: Secondary | ICD-10-CM | POA: Diagnosis not present

## 2019-05-12 DIAGNOSIS — J984 Other disorders of lung: Secondary | ICD-10-CM | POA: Diagnosis not present

## 2019-05-12 DIAGNOSIS — Z9181 History of falling: Secondary | ICD-10-CM

## 2019-05-12 DIAGNOSIS — R778 Other specified abnormalities of plasma proteins: Secondary | ICD-10-CM

## 2019-05-12 DIAGNOSIS — J189 Pneumonia, unspecified organism: Secondary | ICD-10-CM | POA: Diagnosis not present

## 2019-05-12 DIAGNOSIS — U071 COVID-19: Secondary | ICD-10-CM | POA: Diagnosis not present

## 2019-05-12 DIAGNOSIS — M2550 Pain in unspecified joint: Secondary | ICD-10-CM | POA: Diagnosis not present

## 2019-05-12 DIAGNOSIS — Z66 Do not resuscitate: Secondary | ICD-10-CM | POA: Diagnosis not present

## 2019-05-12 DIAGNOSIS — G9608 Other cranial cerebrospinal fluid leak: Secondary | ICD-10-CM

## 2019-05-12 DIAGNOSIS — R509 Fever, unspecified: Secondary | ICD-10-CM | POA: Diagnosis not present

## 2019-05-12 DIAGNOSIS — R1310 Dysphagia, unspecified: Secondary | ICD-10-CM | POA: Diagnosis present

## 2019-05-12 DIAGNOSIS — F0280 Dementia in other diseases classified elsewhere without behavioral disturbance: Secondary | ICD-10-CM | POA: Diagnosis not present

## 2019-05-12 DIAGNOSIS — F0281 Dementia in other diseases classified elsewhere with behavioral disturbance: Secondary | ICD-10-CM | POA: Diagnosis not present

## 2019-05-12 DIAGNOSIS — I129 Hypertensive chronic kidney disease with stage 1 through stage 4 chronic kidney disease, or unspecified chronic kidney disease: Secondary | ICD-10-CM | POA: Diagnosis not present

## 2019-05-12 DIAGNOSIS — R404 Transient alteration of awareness: Secondary | ICD-10-CM | POA: Diagnosis not present

## 2019-05-12 DIAGNOSIS — D61818 Other pancytopenia: Secondary | ICD-10-CM | POA: Diagnosis present

## 2019-05-12 DIAGNOSIS — F0390 Unspecified dementia without behavioral disturbance: Secondary | ICD-10-CM | POA: Diagnosis present

## 2019-05-12 DIAGNOSIS — R0602 Shortness of breath: Secondary | ICD-10-CM

## 2019-05-12 DIAGNOSIS — J1282 Pneumonia due to coronavirus disease 2019: Secondary | ICD-10-CM | POA: Diagnosis present

## 2019-05-12 DIAGNOSIS — G9389 Other specified disorders of brain: Secondary | ICD-10-CM | POA: Diagnosis not present

## 2019-05-12 DIAGNOSIS — D649 Anemia, unspecified: Secondary | ICD-10-CM | POA: Diagnosis not present

## 2019-05-12 DIAGNOSIS — R627 Adult failure to thrive: Secondary | ICD-10-CM | POA: Diagnosis not present

## 2019-05-12 DIAGNOSIS — J1289 Other viral pneumonia: Secondary | ICD-10-CM | POA: Diagnosis present

## 2019-05-12 DIAGNOSIS — R0902 Hypoxemia: Secondary | ICD-10-CM | POA: Diagnosis not present

## 2019-05-12 DIAGNOSIS — Z7401 Bed confinement status: Secondary | ICD-10-CM | POA: Diagnosis not present

## 2019-05-12 DIAGNOSIS — R5381 Other malaise: Secondary | ICD-10-CM | POA: Diagnosis not present

## 2019-05-12 LAB — CBC
HCT: 33.3 % — ABNORMAL LOW (ref 39.0–52.0)
Hemoglobin: 11.1 g/dL — ABNORMAL LOW (ref 13.0–17.0)
MCH: 30.3 pg (ref 26.0–34.0)
MCHC: 33.3 g/dL (ref 30.0–36.0)
MCV: 91 fL (ref 80.0–100.0)
Platelets: 80 10*3/uL — ABNORMAL LOW (ref 150–400)
RBC: 3.66 MIL/uL — ABNORMAL LOW (ref 4.22–5.81)
RDW: 14.1 % (ref 11.5–15.5)
WBC: 2.6 10*3/uL — ABNORMAL LOW (ref 4.0–10.5)
nRBC: 0 % (ref 0.0–0.2)

## 2019-05-12 LAB — COMPREHENSIVE METABOLIC PANEL
ALT: 24 U/L (ref 0–44)
AST: 67 U/L — ABNORMAL HIGH (ref 15–41)
Albumin: 2.7 g/dL — ABNORMAL LOW (ref 3.5–5.0)
Alkaline Phosphatase: 46 U/L (ref 38–126)
Anion gap: 9 (ref 5–15)
BUN: 23 mg/dL (ref 8–23)
CO2: 21 mmol/L — ABNORMAL LOW (ref 22–32)
Calcium: 7.6 mg/dL — ABNORMAL LOW (ref 8.9–10.3)
Chloride: 109 mmol/L (ref 98–111)
Creatinine, Ser: 1.3 mg/dL — ABNORMAL HIGH (ref 0.61–1.24)
GFR calc Af Amer: 56 mL/min — ABNORMAL LOW (ref >60)
GFR calc non Af Amer: 48 mL/min — ABNORMAL LOW (ref >60)
Glucose, Bld: 102 mg/dL — ABNORMAL HIGH (ref 70–99)
Potassium: 3.7 mmol/L (ref 3.5–5.1)
Sodium: 139 mmol/L (ref 135–145)
Total Bilirubin: 0.9 mg/dL (ref 0.3–1.2)
Total Protein: 6.9 g/dL (ref 6.5–8.1)

## 2019-05-12 LAB — URINALYSIS, ROUTINE W REFLEX MICROSCOPIC
Bilirubin Urine: NEGATIVE
Glucose, UA: NEGATIVE mg/dL
Ketones, ur: NEGATIVE mg/dL
Leukocytes,Ua: NEGATIVE
Nitrite: NEGATIVE
Protein, ur: >=300 mg/dL — AB
Specific Gravity, Urine: 1.018 (ref 1.005–1.030)
pH: 6 (ref 5.0–8.0)

## 2019-05-12 LAB — TROPONIN I: Troponin I: 0.24 ng/mL (ref <0.03)

## 2019-05-12 LAB — LACTIC ACID, PLASMA: Lactic Acid, Venous: 1 mmol/L (ref 0.5–1.9)

## 2019-05-12 MED ORDER — SODIUM CHLORIDE 0.9 % IV BOLUS
1000.0000 mL | Freq: Once | INTRAVENOUS | Status: AC
  Administered 2019-05-12: 1000 mL via INTRAVENOUS

## 2019-05-12 MED ORDER — SODIUM CHLORIDE 0.9 % IV SOLN
500.0000 mg | Freq: Once | INTRAVENOUS | Status: AC
  Administered 2019-05-12: 21:00:00 500 mg via INTRAVENOUS
  Filled 2019-05-12: qty 500

## 2019-05-12 MED ORDER — SODIUM CHLORIDE 0.9 % IV SOLN
1.0000 g | Freq: Once | INTRAVENOUS | Status: AC
  Administered 2019-05-12: 1 g via INTRAVENOUS
  Filled 2019-05-12: qty 10

## 2019-05-12 NOTE — ED Triage Notes (Signed)
Pt BIB EMS from home. Pt speaks Napalese. Pt family reports failure to thrive, not eating, incontinence. Pt alert.   Temp 100 BP 130/90 HR 90 RR 20   CBG 141  20G R wrist

## 2019-05-12 NOTE — ED Notes (Signed)
ED Provider at bedside. 

## 2019-05-12 NOTE — ED Notes (Signed)
CRITICAL VALUE STICKER  CRITICAL VALUE: Troponin 0.24  DATE & TIME NOTIFIED: 05/12/19 2140  MD NOTIFIED: Pilar Plate MD  TIME OF NOTIFICATION: 2140

## 2019-05-12 NOTE — ED Notes (Signed)
Pt repeatedly pulling off leads for cardiac monitoring, otherwise pt calm and alert. Dr. Pilar Plate aware.

## 2019-05-12 NOTE — ED Notes (Signed)
Bed: WA09 Expected date:  Expected time:  Means of arrival:  Comments: EMS-FTT   

## 2019-05-12 NOTE — ED Notes (Signed)
Hospitalist bedside 

## 2019-05-12 NOTE — ED Notes (Addendum)
Napali interpreter on phone with patient and RN. Pt did not speak back. RN spoke to pt's grandson via phone who confirmed he does speak Napali and that he has not been speaking at home "in over a week". Dr. Pilar Plate made aware.

## 2019-05-12 NOTE — ED Provider Notes (Signed)
[facility] Provider Note MRN:  [id number]  Arrival date & time: 05/12/19     Chief Complaint   Weakness and Urinary Incontinence   History of Present Illness   Martin Tanner is a 85 y.o. year-old male with a history of CKD, BPH presenting to the ED with chief complaint of weakness and urinary incontinence.  Patient speaks Nepali.  Family reports weakness, urinary incontinence.  Family also reports that he has been nonverbal for the past week, normally conversant.  Seems confused.  I was unable to obtain an accurate HPI, PMH, or ROS due to the patient's altered mental status.  Review of Systems  A complete 10 system review of systems was obtained and all systems are negative except as noted in the HPI and PMH.   Patient's Health History    Past Medical History:  Diagnosis Date  . Anemia   . BPH (benign prostatic hyperplasia)   . Chronic kidney disease   . Upper GI bleed 11/17/2017    Past Surgical History:  Procedure Laterality Date  . ESOPHAGOGASTRODUODENOSCOPY (EGD) WITH PROPOFOL N/A 11/18/2017   Procedure: ESOPHAGOGASTRODUODENOSCOPY (EGD) WITH PROPOFOL;  Surgeon: Vida RiggerMagod, Marc, MD;  [facility];  Service: Endoscopy;  Laterality: N/A;  . None    . TRANSURETHRAL RESECTION OF PROSTATE N/A 03/16/2013   Procedure: TRANSURETHRAL RESECTION OF THE PROSTATE WITH GYRUS INSTRUMENTS;  Surgeon: Marcine MatarStephen Dahlstedt, MD;  Location: [facility] ORS;  Service: Urology;  Laterality: N/A;    Family History  Problem Relation Age of Onset  . Other Other        Negative CAD  . Other Other        Negative DM2  . Other Other        Negative HTN  . Other Other        Negative Cancer    Social History   Socioeconomic History  . Marital status: Widowed    Spouse name: Not on file  . Number of children: Not on file  . Years of education: Not on file  . Highest education level: Not on file  Occupational History  . Not on file  Social Needs  .  Financial resource strain: Not on file  . Food insecurity:    Worry: Not on file    Inability: Not on file  . Transportation needs:    Medical: Not on file    Non-medical: Not on file  Tobacco Use  . Smoking status: Current Every Day Smoker    Packs/day: 0.25    Years: 62.00    Pack years: 15.50    Types: Cigarettes  . Smokeless tobacco: Never Used  Substance and Sexual Activity  . Alcohol use: No    Alcohol/week: 0.0 standard drinks  . Drug use: No  . Sexual activity: Never  Lifestyle  . Physical activity:    Days per week: Not on file    Minutes per session: Not on file  . Stress: Not on file  Relationships  . Social connections:    Talks on phone: Not on file    Gets together: Not on file    Attends religious service: Not on file    Active member of club or organization: Not on file    Attends meetings of clubs or organizations: Not on file    Relationship status: Not on file  . Intimate partner violence:    Fear of current or ex partner: Not on file    Emotionally abused:  Not on file    Physically abused: Not on file    Forced sexual activity: Not on file  Other Topics Concern  . Not on file  Social History Narrative  . Not on file     Physical Exam  Vital Signs and Nursing Notes reviewed Vitals:   05/12/19 2030 05/12/19 2130  BP: 112/90 114/68  Pulse: 92 87  Resp: 18 18  Temp:    SpO2: 94% 97%    CONSTITUTIONAL: Well-appearing, NAD NEURO: Alert, awake, moving all extremities EYES:  eyes equal and reactive ENT/NECK:  no LAD, no JVD CARDIO: Regular rate, well-perfused, normal S1 and S2 PULM:  CTAB no wheezing or rhonchi GI/GU:  normal bowel sounds, non-distended, non-tender MSK/SPINE:  No gross deformities, no edema SKIN:  no rash, atraumatic PSYCH:  Appropriate speech and behavior  Diagnostic and Interventional Summary    EKG Interpretation  Date/Time:  Tuesday May 12 2019 16:28:32 EDT Ventricular Rate:  90 PR Interval:    QRS Duration: 87  QT Interval:  368 QTC Calculation: 451 R Axis:   63 Text Interpretation:  Sinus rhythm ST elevation, consider inferior injury Baseline wander in lead(s) I II III aVR aVF V1 V2 V3 V4 V5 V6 Confirmed by Kennis Carina [phone]) on 05/12/2019 5:24:36 PM      Labs Reviewed  CBC - Abnormal; Notable for the following components:      Result Value   WBC 2.6 (*)    RBC 3.66 (*)    Hemoglobin 11.1 (*)    HCT 33.3 (*)    Platelets 80 (*)    All other components within normal limits  COMPREHENSIVE METABOLIC PANEL - Abnormal; Notable for the following components:   CO2 21 (*)    Glucose, Bld 102 (*)    Creatinine, Ser 1.30 (*)    Calcium 7.6 (*)    Albumin 2.7 (*)    AST 67 (*)    GFR calc non Af Amer 48 (*)    GFR calc Af Amer 56 (*)    All other components within normal limits  URINALYSIS, ROUTINE W REFLEX MICROSCOPIC - Abnormal; Notable for the following components:   APPearance HAZY (*)    Hgb urine dipstick MODERATE (*)    Protein, ur >=300 (*)    Bacteria, UA FEW (*)    All other components within normal limits  TROPONIN I - Abnormal; Notable for the following components:   Troponin I 0.24 (*)    All other components within normal limits  CULTURE, BLOOD (ROUTINE X 2)  CULTURE, BLOOD (ROUTINE X 2)  LACTIC ACID, PLASMA  TROPONIN I    CT HEAD WO CONTRAST  Final Result    DG Chest Port 1 View  Final Result      Medications  sodium chloride 0.9 % bolus 1,000 mL (0 mLs Intravenous Stopped 05/12/19 2018)  cefTRIAXone (ROCEPHIN) 1 g in sodium chloride 0.9 % 100 mL IVPB (0 g Intravenous Stopped 05/12/19 1836)  azithromycin (ZITHROMAX) 500 mg in sodium chloride 0.9 % 250 mL IVPB (0 mg Intravenous Stopped 05/12/19 2158)     Procedures Critical Care  ED Course and Medical Decision Making  I have reviewed the triage vital signs and the nursing notes.  Pertinent labs & imaging results that were available during my care of the patient were reviewed by me and considered in my medical  decision making (see below for details).  Altered mental status, fever, reported urinary incontinence in this 85 year old male, Nepali speaking  only, however has not spoken in 1 week.  Was seen here in the emergency department 1 to 2 weeks ago and diagnosed with level coronavirus, was discharged.  Temperature here 102.3, normotensive, pulse in the 60s, generally well-appearing in no distress.  Suspect UTI, pneumonia, continued fever related to coronavirus.  Patient has no meningismus on exam.  Work-up pending.  Clinical Course as of May 11 2218  Tue May 12, 2019  2017 Work-up reveals likely pneumonia, will add azithromycin to ceftriaxone for empiric CAP coverage.  CT head reveals increasing size of bilateral hygromas with mass-effect, given his altered mental status for the past week will consult neurosurgery for recommendations.   [MB]  2145 After significant lab delay, patient found to have elevated troponin at 0.24.  Requested repeat EKG unfortunately not yet performed, obtaining now.   [MB]  2146 NSGY recommends medical management, no surgical intervention necessary at this time.   [MB]    Clinical Course User Index [MB] Sabas Sous, MD    Admitted to hospital service for further care.  Elmer Sow. Pilar Plate, MD [facility] Emergency Medicine [facility] [email or web address]  Final Clinical Impressions(s) / ED Diagnoses     ICD-10-CM   1. 2019 novel coronavirus disease (COVID-19) U07.1   2. Fever R50.9 DG Chest [facility] 1 View    DG Chest Port 1 View  3. Community acquired pneumonia, unspecified laterality J18.9   4. Subdural hygroma G96.0   5. Elevated troponin R79.89     ED Discharge Orders    None         Sabas Sous, MD 05/12/19 2219

## 2019-05-12 NOTE — H&P (Signed)
History and Physical  Martin Tanner WUJ:[id number] DOB: 05/15/1928 DOA: 05/12/2019  Referring physician: ER Provider PCP: System, Pcp Not In  Outpatient Specialists:    Patient coming from: Home  Chief Complaint: Fever and altered mentation          HPI: Patient is an 85 year old male with past medical history significant for chronic kidney disease stage III, chronic pancytopenia, anemia, upper GI bleed and BPH.  Patient is non-English speaking.  Apparently, patient was diagnosed with COVID-19 in the last few days.  Patient presents with fever and altered mentation.  Patient could not or would not give any history.  As per collateral information, patient has not spoken to anyone in the last 1 week.  Patient continues to move all extremities.  Patient remains awake and alert, but just will not speak to anyone.  Poor p.o. intake is reported.  Patient has fever, with documented temperature 102.3 F on presentation.  Chest x-ray is suggestive of pneumonic process.  Troponin is 0.24, without mention of chest pain or other cardiac symptoms.  CO2 is 21.  CT scan head reveals chronic subdural hematoma versus hygroma.  Patient will be admitted for further assessment and management.  ED Course: Patient has been pancultured.  Patient was started on IV antibiotics.  Patient will be admitted for supportive care for COVID-19.  Patient is currently on room air, with O2 sat in the 90s.  Pertinent labs: CBC reveals WBC of 2.6, hemoglobin of 11.1, hematocrit of 33.3, MCV of 91 with platelet count of 80.  Is 139, potassium of 3.7, chloride 109, CO2 21, BUN of 23 with creatinine of 1.3 and blood sugar of 102.  EKG: Independently reviewed.   Imaging: independently reviewed.   Review of Systems: Unobtainable.  Past Medical History:  Diagnosis Date  . Anemia   . BPH (benign prostatic hyperplasia)   . Chronic kidney disease   . Upper GI bleed 11/17/2017    Past Surgical History:  Procedure Laterality Date   . ESOPHAGOGASTRODUODENOSCOPY (EGD) WITH PROPOFOL N/A 11/18/2017   Procedure: ESOPHAGOGASTRODUODENOSCOPY (EGD) WITH PROPOFOL;  Surgeon: Vida RiggerMagod, Marc, MD;  [facility];  Service: Endoscopy;  Laterality: N/A;  . None    . TRANSURETHRAL RESECTION OF PROSTATE N/A 03/16/2013   Procedure: TRANSURETHRAL RESECTION OF THE PROSTATE WITH GYRUS INSTRUMENTS;  Surgeon: Marcine MatarStephen Dahlstedt, MD;  Location: [facility];  Service: Urology;  Laterality: N/A;     reports that he has been smoking cigarettes. He has a 15.50 pack-year smoking history. He has never used smokeless tobacco. He reports that he does not drink alcohol or use drugs.  No Known Allergies  Family History  Problem Relation Age of Onset  . Other Other        Negative CAD  . Other Other        Negative DM2  . Other Other        Negative HTN  . Other Other        Negative Cancer     Prior to Admission medications   Not on File    Physical Exam: Vitals:   05/12/19 1930 05/12/19 2030 05/12/19 2130 05/12/19 2231  BP: 108/72 112/90 114/68 133/82  Pulse: 91 92 87 83  Resp: 18 18 18  (!) 22  Temp:      TempSrc:      SpO2: 97% 94% 97% 94%  Weight:      Height:        Constitutional:  . Appears calm and comfortable  Eyes:  . Mild pallor. No jaundice.  ENMT:  . external ears, nose appear normal Neck:  . Neck is supple. No JVD Respiratory:  . Decreased air entry. Cardiovascular:  . S1S2 . No LE extremity edema   Abdomen:  . Abdomen is soft and non tender. Organs are difficult to assess. Neurologic:  . Awake and alert. . Moves all limbs.  Wt Readings from Last 3 Encounters:  05/12/19 45.4 kg  01/11/18 44.6 kg  11/18/17 43.5 kg    I have personally reviewed following labs and imaging studies  Labs on Admission:  CBC: Recent Labs  Lab 05/08/19 1330 05/12/19 2040  WBC 2.1* 2.6*  NEUTROABS 1.4*  --   HGB 12.3* 11.1*  HCT 36.1* 33.3*  MCV 88.7 91.0  PLT PLATELET CLUMPS NOTED ON SMEAR, COUNT APPEARS  DECREASED 80*   Basic Metabolic Panel: Recent Labs  Lab 05/08/19 1330 05/12/19 2040  NA 136 139  K 5.0 3.7  CL 105 109  CO2 21* 21*  GLUCOSE 94 102*  BUN 24* 23  CREATININE 1.57* 1.30*  CALCIUM 8.6* 7.6*   Liver Function Tests: Recent Labs  Lab 05/08/19 1330 05/12/19 2040  AST 56* 67*  ALT 20 24  ALKPHOS 65 46  BILITOT 1.1 0.9  PROT 7.6 6.9  ALBUMIN 3.3* 2.7*   No results for input(s): LIPASE, AMYLASE in the last 168 hours. No results for input(s): AMMONIA in the last 168 hours. Coagulation Profile: No results for input(s): INR, PROTIME in the last 168 hours. Cardiac Enzymes: Recent Labs  Lab 05/12/19 2040  TROPONINI 0.24*   BNP (last 3 results) No results for input(s): PROBNP in the last 8760 hours. HbA1C: No results for input(s): HGBA1C in the last 72 hours. CBG: No results for input(s): GLUCAP in the last 168 hours. Lipid Profile: No results for input(s): CHOL, HDL, LDLCALC, TRIG, CHOLHDL, LDLDIRECT in the last 72 hours. Thyroid Function Tests: No results for input(s): TSH, T4TOTAL, FREET4, T3FREE, THYROIDAB in the last 72 hours. Anemia Panel: No results for input(s): VITAMINB12, FOLATE, FERRITIN, TIBC, IRON, RETICCTPCT in the last 72 hours. Urine analysis:    Component Value Date/Time   COLORURINE YELLOW 05/12/2019 1846   APPEARANCEUR HAZY (A) 05/12/2019 1846   LABSPEC 1.018 05/12/2019 1846   PHURINE 6.0 05/12/2019 1846   GLUCOSEU NEGATIVE 05/12/2019 1846   HGBUR MODERATE (A) 05/12/2019 1846   BILIRUBINUR NEGATIVE 05/12/2019 1846   BILIRUBINUR neg 02/23/2014 1818   KETONESUR NEGATIVE 05/12/2019 1846   PROTEINUR >=300 (A) 05/12/2019 1846   UROBILINOGEN 0.2 02/23/2014 1818   UROBILINOGEN 0.2 03/31/2013 1700   NITRITE NEGATIVE 05/12/2019 1846   LEUKOCYTESUR NEGATIVE 05/12/2019 1846   Sepsis Labs: (procalcitonin:4,lacticidven:4) ) Recent Results (from the past 240 hour(s))  SARS Coronavirus 2 (CEPHEID - Performed in [facility]  hospital lab), Hosp Order     Status: Abnormal   Collection Time: 05/08/19  1:10 PM  Result Value Ref Range Status   SARS Coronavirus 2 POSITIVE (A) NEGATIVE Final    Comment: RESULT CALLED TO, READ BACK BY AND VERIFIED WITH: Bedelia Person RN 14:30 05/08/19 ([email or web address]) (NOTE) If result is NEGATIVE SARS-CoV-2 target nucleic acids are NOT DETECTED. The SARS-CoV-2 RNA is generally detectable in upper and lower  respiratory specimens during the acute phase of infection. The lowest  concentration of SARS-CoV-2 viral copies this assay can detect is 250  copies / mL. A negative result does not preclude SARS-CoV-2 infection  and should not be used as the sole basis  for treatment or other  patient management decisions.  A negative result may occur with  improper specimen collection / handling, submission of specimen other  than nasopharyngeal swab, presence of viral mutation(s) within the  areas targeted by this assay, and inadequate number of viral copies  (<250 copies / mL). A negative result must be combined with clinical  observations, patient history, and epidemiological information. If result is POSITIVE SARS-CoV-2 target nucleic acids are DETECTED. T he SARS-CoV-2 RNA is generally detectable in upper and lower  respiratory specimens during the acute phase of infection.  Positive  results are indicative of active infection with SARS-CoV-2.  Clinical  correlation with patient history and other diagnostic information is  necessary to determine patient infection status.  Positive results do  not rule out bacterial infection or co-infection with other viruses. If result is PRESUMPTIVE POSTIVE SARS-CoV-2 nucleic acids MAY BE PRESENT.   A presumptive positive result was obtained on the submitted specimen  and confirmed on repeat testing.  While 2019 novel coronavirus  (SARS-CoV-2) nucleic acids may be present in the submitted sample  additional confirmatory testing may be necessary for  epidemiological  and / or clinical management purposes  to differentiate between  SARS-CoV-2 and other Sarbecovirus currently known to infect humans.  If clinically indicated additional testing with an alternate test  methodology [phone]) is  advised. The SARS-CoV-2 RNA is generally  detectable in upper and lower respiratory specimens during the acute  phase of infection. The expected result is Negative. Fact Sheet for Patients:  BoilerBrush.com.cy Fact Sheet for Healthcare Providers: https://pope.com/ This test is not yet approved or cleared by the [place] FDA and has been authorized for detection and/or diagnosis of SARS-CoV-2 by FDA under an Emergency Use Authorization (EUA).  This EUA will remain in effect (meaning this test can be used) for the duration of the COVID-19 declaration under Section 564(b)(1) of the Act, 21 U.S.C. section 360bbb-3(b)(1), unless the authorization is terminated or revoked sooner. Performed at [facility], [address] [place], [place] [address]   Blood Culture (routine x 2)     Status: None (Preliminary result)   Collection Time: 05/08/19  1:15 PM  Result Value Ref Range Status   Specimen Description BLOOD LEFT ANTECUBITAL  Final   Special Requests   Final    BOTTLES DRAWN AEROBIC AND ANAEROBIC Blood Culture adequate volume   Culture   Final    NO GROWTH 4 DAYS Performed at [facility], [address], [place], [place] [address]    Report Status PENDING  Incomplete  Blood Culture (routine x 2)     Status: None (Preliminary result)   Collection Time: 05/08/19  1:30 PM  Result Value Ref Range Status   Specimen Description BLOOD RIGHT ANTECUBITAL  Final   Special Requests   Final    BOTTLES DRAWN AEROBIC AND ANAEROBIC Blood Culture adequate volume   Culture   Final    NO GROWTH 4 DAYS Performed at [facility], [address], [place], [place] [address]    Report Status  PENDING  Incomplete  Urine culture     Status: None   Collection Time: 05/08/19  4:51 PM  Result Value Ref Range Status   Specimen Description URINE, RANDOM  Final   Special Requests NONE  Final   Culture   Final    NO GROWTH Performed at [facility], [address], [place], [place] [address]  Report Status 05/09/2019 FINAL  Final      Radiological Exams on Admission: Ct Head Wo Contrast  Result Date: 05/12/2019 CLINICAL DATA:  85 year old male with failure to thrive, altered mental status. EXAM: CT HEAD WITHOUT CONTRAST TECHNIQUE: Contiguous axial images were obtained from the base of the skull through the vertex without intravenous contrast. COMPARISON:  Head CT 01/09/2018. FINDINGS: Brain: Study is intermittently degraded by motion artifact despite repeated imaging attempts. Fairly symmetric bilateral subdural hygromas or chronic hematomas have increased since 2019 most apparent on the coronal views where the subdural veins are more displaced from the inner table (coronal series 10, images 40 and 41). The collections measure 4-5 millimeters on the left and 3-4 millimeters on the right. There is no associated midline shift. Mild mass effect on both hemispheres. Basilar cisterns remain normal. No superimposed acute intracranial hemorrhage. Patchy and confluent bilateral cerebral white matter hypodensity and evidence of small chronic lacunar infarcts in the right basal ganglia and pons are stable. No ventriculomegaly. No cortically based acute infarct identified. Vascular: Calcified atherosclerosis at the skull base. No suspicious intracranial vascular hyperdensity. Skull: Mild motion artifact. No acute osseous abnormality identified. Sinuses/Orbits: Bubbly opacity in the bilateral maxillary sinuses and left sphenoid with small fluid levels. Chronic sinus mucoperiosteal thickening. The frontal sinuses today are clear. Tympanic cavities and mastoids are clear. Other: No acute orbit or scalp  soft tissue findings. IMPRESSION: 1. 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 Mild associated mass effect on both hemispheres with no midline shift. 2. No other acute intracranial abnormality. Chronic small vessel disease is stable since 2019. 3. Positive also for acute on chronic paranasal sinusitis. Electronically Signed   By: Odessa Fleming M.D.   On: 05/12/2019 19:47   Dg Chest Port 1 View  Result Date: 05/12/2019 CLINICAL DATA:  Positive COVID-19.  Failure to thrive. EXAM: PORTABLE CHEST 1 VIEW COMPARISON:  May 08, 2019 FINDINGS: There is patchy opacity in the right base. The lungs elsewhere are clear. Heart is upper normal in size with pulmonary vascularity normal. No adenopathy. There is aortic atherosclerosis. No bone lesions. IMPRESSION: Patchy infiltrate consistent with pneumonia right base. Lungs elsewhere clear. Heart upper normal in size. No adenopathy evident. Aortic Atherosclerosis (ICD10-I70.0). Electronically Signed   By: Bretta Bang III M.D.   On: 05/12/2019 19:03    EKG: Independently reviewed.   Active Problems:   Pneumonia due to severe acute respiratory syndrome coronavirus 2 (SARS-CoV-2)   Assessment/Plan Pneumonia due to severe acute respiratory syndrome coronavirus 2 (SARS-CoV-2): Admit patient for further assessment and management Supportive care Further management depend on hospital course  Altered mental status: Continue to assess and work-up patient  Elevated troponin: No cardiac symptoms Trend troponin Repeat EKG Management depend on hospital course.  Pancytopenia: This is chronic. 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

## 2019-05-13 ENCOUNTER — Inpatient Hospital Stay (HOSPITAL_COMMUNITY): Payer: Medicaid Other

## 2019-05-13 DIAGNOSIS — R509 Fever, unspecified: Secondary | ICD-10-CM

## 2019-05-13 DIAGNOSIS — H539 Unspecified visual disturbance: Secondary | ICD-10-CM | POA: Diagnosis not present

## 2019-05-13 DIAGNOSIS — F0280 Dementia in other diseases classified elsewhere without behavioral disturbance: Secondary | ICD-10-CM

## 2019-05-13 LAB — CBC WITH DIFFERENTIAL/PLATELET
Abs Immature Granulocytes: 0.03 10*3/uL (ref 0.00–0.07)
Basophils Absolute: 0 10*3/uL (ref 0.0–0.1)
Basophils Relative: 0 %
Eosinophils Absolute: 0 10*3/uL (ref 0.0–0.5)
Eosinophils Relative: 0 %
HCT: 36.2 % — ABNORMAL LOW (ref 39.0–52.0)
Hemoglobin: 12.2 g/dL — ABNORMAL LOW (ref 13.0–17.0)
Immature Granulocytes: 1 %
Lymphocytes Relative: 15 %
Lymphs Abs: 0.5 10*3/uL — ABNORMAL LOW (ref 0.7–4.0)
MCH: 30.1 pg (ref 26.0–34.0)
MCHC: 33.7 g/dL (ref 30.0–36.0)
MCV: 89.4 fL (ref 80.0–100.0)
Monocytes Absolute: 0.1 10*3/uL (ref 0.1–1.0)
Monocytes Relative: 3 %
Neutro Abs: 2.5 10*3/uL (ref 1.7–7.7)
Neutrophils Relative %: 81 %
Platelets: 95 10*3/uL — ABNORMAL LOW (ref 150–400)
RBC: 4.05 MIL/uL — ABNORMAL LOW (ref 4.22–5.81)
RDW: 14 % (ref 11.5–15.5)
WBC: 3.1 10*3/uL — ABNORMAL LOW (ref 4.0–10.5)
nRBC: 0 % (ref 0.0–0.2)

## 2019-05-13 LAB — COMPREHENSIVE METABOLIC PANEL
ALT: 28 U/L (ref 0–44)
AST: 84 U/L — ABNORMAL HIGH (ref 15–41)
Albumin: 2.9 g/dL — ABNORMAL LOW (ref 3.5–5.0)
Alkaline Phosphatase: 51 U/L (ref 38–126)
Anion gap: 11 (ref 5–15)
BUN: 22 mg/dL (ref 8–23)
CO2: 19 mmol/L — ABNORMAL LOW (ref 22–32)
Calcium: 8.2 mg/dL — ABNORMAL LOW (ref 8.9–10.3)
Chloride: 110 mmol/L (ref 98–111)
Creatinine, Ser: 1.34 mg/dL — ABNORMAL HIGH (ref 0.61–1.24)
GFR calc Af Amer: 54 mL/min — ABNORMAL LOW (ref >60)
GFR calc non Af Amer: 47 mL/min — ABNORMAL LOW (ref >60)
Glucose, Bld: 99 mg/dL (ref 70–99)
Potassium: 3.6 mmol/L (ref 3.5–5.1)
Sodium: 140 mmol/L (ref 135–145)
Total Bilirubin: 0.7 mg/dL (ref 0.3–1.2)
Total Protein: 7.3 g/dL (ref 6.5–8.1)

## 2019-05-13 LAB — FIBRINOGEN: Fibrinogen: 554 mg/dL — ABNORMAL HIGH (ref 210–475)

## 2019-05-13 LAB — FERRITIN: Ferritin: 1834 ng/mL — ABNORMAL HIGH (ref 24–336)

## 2019-05-13 LAB — TRIGLYCERIDES: Triglycerides: 92 mg/dL (ref <150)

## 2019-05-13 LAB — C-REACTIVE PROTEIN: CRP: 15.1 mg/dL — ABNORMAL HIGH (ref <1.0)

## 2019-05-13 LAB — ABO/RH: ABO/RH(D): O POS

## 2019-05-13 LAB — TROPONIN I
Troponin I: 0.26 ng/mL (ref <0.03)
Troponin I: 0.24 ng/mL (ref <0.03)

## 2019-05-13 LAB — CULTURE, BLOOD (ROUTINE X 2)
Culture: NO GROWTH
Culture: NO GROWTH
Special Requests: ADEQUATE
Special Requests: ADEQUATE

## 2019-05-13 LAB — D-DIMER, QUANTITATIVE: D-Dimer, Quant: 4.09 ug/mL-FEU — ABNORMAL HIGH (ref 0.00–0.50)

## 2019-05-13 MED ORDER — SODIUM CHLORIDE 0.9 % IV SOLN: 500.0000 mg | INTRAVENOUS | Status: DC

## 2019-05-13 MED ORDER — ENSURE ENLIVE PO LIQD
237.0000 mL | Freq: Two times a day (BID) | ORAL | Status: DC
  Administered 2019-05-14 – 2019-05-16 (×3): 237 mL via ORAL

## 2019-05-13 MED ORDER — METHYLPREDNISOLONE SODIUM SUCC 125 MG IJ SOLR
80.0000 mg | Freq: Four times a day (QID) | INTRAMUSCULAR | Status: DC
  Administered 2019-05-13 – 2019-05-14 (×5): 80 mg via INTRAVENOUS
  Filled 2019-05-13 (×5): qty 2

## 2019-05-13 MED ORDER — SODIUM CHLORIDE 0.9 % IV SOLN: 1.0000 g | INTRAVENOUS | Status: DC

## 2019-05-13 MED ORDER — PANTOPRAZOLE SODIUM 40 MG PO TBEC
40.0000 mg | DELAYED_RELEASE_TABLET | Freq: Every day | ORAL | Status: DC
  Administered 2019-05-14 – 2019-05-21 (×8): 40 mg via ORAL
  Filled 2019-05-13 (×9): qty 1

## 2019-05-13 MED ORDER — CARVEDILOL 3.125 MG PO TABS
3.1250 mg | ORAL_TABLET | Freq: Two times a day (BID) | ORAL | Status: DC
  Administered 2019-05-13 – 2019-05-21 (×16): 3.125 mg via ORAL
  Filled 2019-05-13 (×16): qty 1

## 2019-05-13 NOTE — Progress Notes (Signed)
PROGRESS NOTE                                                                                                                                                                                                             Patient Demographics:    Martin Tanner, is a 85 y.o. male, DOB - February 18, 1928, FUX:[id number]  Outpatient Primary MD for the patient is System, Pcp Not In    LOS - 1  Admit date - 05/12/2019    Chief Complaint  Patient presents with   Weakness   Urinary Incontinence       Brief Narrative - 85 year old Nepalese speaking gentleman with advanced underlying dementia, chronic kidney disease stage III, chronic pancytopenia, anemia, history of UGI bleed in the past, BPH.  He lives with his son and family members in [place].  He was brought here for running fevers chest x-ray assessed of a viral pneumonia, CT head showed Chronic Hygromas he also tested positive for COVID-19 and he was admitted.  Much of the history was obtained through his grandson Mr. [name] Bye.    Subjective:    Martin Tanner today in bed appears to be in no distress, he has advanced dementia and unable to cooperate in exam or answer questions.   Assessment  & Plan :      1. Viral pneumonitis due to Acute Covid 19 Viral Illness during the ongoing 2020 Covid 19 Pandemic - he is currently not hypoxic but chest x-ray evidence of some pneumonitis, inflammatory markers borderline elevated, start steroids, continue supportive care, flutter valve added for pulmonary toiletry, nursing staff encouraged to sit up the patient in chair and prone him when possible while in bed.  Monitor trend clinically.  He is not a candidate for heroic measures due to his underlying advanced dementia, frail status and chronic hygromas.  This was discussed with his grandson  Mr. Veryl Speak, patient was made DNR but full medical treatment will be continued.  COVID-19 Labs  Recent Labs   05/13/19 0525 05/13/19 0527 05/13/19 0528  DDIMER  --  4.09*  --   FERRITIN  --   --  1,834*  CRP 15.1*  --   --     Lab Results  Component Value Date   SARSCOV2NAA POSITIVE (A) 05/08/2019    2.  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 -  Has underlying advanced dementia and likely multiple falls in the past, no focal deficits, supportive care.  Outpatient PCP follow-up.  3.  LVH on EKG.  Suggestive of poorly controlled hypertension.  Currently blood pressure stable will monitor.  Troponin mildly elevated but flat and non-ACS pattern likely due to underlying LVH and strain.  Not a candidate for aspirin due to chronic hygromas and advance fall risk.  Will try low-dose beta-blocker if he can tolerate.  4.  Pancytopenia.  Cause unclear.  Could be related to COVID related illness.  Monitor.  5.  Has dementia.  At baseline.  High fall risk.    Condition - Extremely Guarded  Family Communication  :   Mr. Arlander Gillen grandson  Code Status :  DNR  Diet : Regular  Disposition Plan  :  TBD, PT to eval  Consults  :  None  Procedures  :    CT head - 1. 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 Mild associated mass effect on both hemispheres with no midline shift. 2. No other acute intracranial abnormality. Chronic small vessel disease is stable since 2019. 3. Positive also for acute on chronic paranasal sinusitis.  PUD Prophylaxis : PPI  DVT Prophylaxis  :   SCD due to combination of likely acute worsening of chronic subdural hygroma, pancytopenia and high fall risk due to underlying dementia.  Lab Results  Component Value Date   PLT 95 (L) 05/13/2019    Inpatient Medications  Scheduled Meds:  methylPREDNISolone (SOLU-MEDROL) injection  80 mg Intravenous Q6H   Continuous Infusions:  azithromycin     cefTRIAXone (ROCEPHIN)  IV     PRN Meds:.  Antibiotics  :    Anti-infectives (From  admission, onward)   Start     Dose/Rate Route Frequency Ordered Stop   05/13/19 1800  cefTRIAXone (ROCEPHIN) 1 g in sodium chloride 0.9 % 100 mL IVPB     1 g 200 mL/hr over 30 Minutes Intravenous Every 24 hours 05/13/19 0232     05/13/19 1800  azithromycin (ZITHROMAX) 500 mg in sodium chloride 0.9 % 250 mL IVPB     500 mg 250 mL/hr over 60 Minutes Intravenous Every 24 hours 05/13/19 0232 05/18/19 1759   05/12/19 2030  azithromycin (ZITHROMAX) 500 mg in sodium chloride 0.9 % 250 mL IVPB     500 mg 250 mL/hr over 60 Minutes Intravenous  Once 05/12/19 2015 05/12/19 2158   05/12/19 1730  cefTRIAXone (ROCEPHIN) 1 g in sodium chloride 0.9 % 100 mL IVPB     1 g 200 mL/hr over 30 Minutes Intravenous  Once 05/12/19 1723 05/12/19 1836       Time Spent in minutes  30   Susa Raring M.D on 05/13/2019 at 10:40 AM  To page go to [email or web address] - password [facility]  [facility] -  Office  [phone]   See all Orders from today for further details    Objective:   Vitals:   05/13/19 0319 05/13/19 0400 05/13/19 0500 05/13/19 0828  BP:  138/78  123/73  Pulse: 88 90 85 86  Resp: 19 (!) Temp:    99.7 F (37.6 C)  TempSrc:    Axillary  SpO2: 99% 100% 99% (!) 55%  Weight:      Height:        Wt Readings from Last 3 Encounters:  05/12/19 45.4 kg  01/11/18 44.6  kg  11/18/17 43.5 kg    No intake or output data in the 24 hours ending 05/13/19 1040   Physical Exam  Awake but at baseline demented, moving all 4 extremities, unable to follow commands [place] .AT,PERRAL Supple Neck,No JVD, No cervical lymphadenopathy appriciated.  Symmetrical Chest wall movement, Good air movement bilaterally, CTAB RRR,No Gallops,Rubs or new Murmurs, No Parasternal Heave +ve B.Sounds, Abd Soft, No tenderness, No organomegaly appriciated, No rebound - guarding or rigidity. No Cyanosis, Clubbing or edema, No new Rash or bruise       Data Review:    CBC Recent Labs  Lab 05/08/19 1330  05/12/19 2040 05/13/19 0527  WBC 2.1* 2.6* 3.1*  HGB 12.3* 11.1* 12.2*  HCT 36.1* 33.3* 36.2*  PLT PLATELET CLUMPS NOTED ON SMEAR, COUNT APPEARS DECREASED 80* 95*  MCV 88.7 91.0 89.4  MCH 30.2 30.3 30.1  MCHC 34.1 33.3 33.7  RDW 13.8 14.1 14.0  LYMPHSABS 0.5*  --  0.5*  MONOABS 0.2  --  0.1  EOSABS 0.0  --  0.0  BASOSABS 0.0  --  0.0    Chemistries  Recent Labs  Lab 05/08/19 1330 05/12/19 2040 05/13/19 0527  NA 136 139 140  K 5.0 3.7 3.6  CL 105 109 110  CO2 21* 21* 19*  GLUCOSE 94 102* 99  BUN 24* 23 22  CREATININE 1.57* 1.30* 1.34*  CALCIUM 8.6* 7.6* 8.2*  AST 56* 67* 84*  ALT ALKPHOS 65 46 51  BILITOT 1.1 0.9 0.7   ------------------------------------------------------------------------------------------------------------------ Recent Labs    05/13/19 0527  TRIG 92    No results found for: HGBA1C ------------------------------------------------------------------------------------------------------------------ No results for input(s): TSH, T4TOTAL, T3FREE, THYROIDAB in the last 72 hours.  Invalid input(s): FREET3  Cardiac Enzymes Recent Labs  Lab 05/12/19 2040 05/13/19 0106 05/13/19 0528  TROPONINI 0.24* 0.26* 0.24*   ------------------------------------------------------------------------------------------------------------------ No results found for: BNP  Micro Results Recent Results (from the past 240 hour(s))  SARS Coronavirus 2 (CEPHEID - Performed in [facility] hospital lab), Hosp Order     Status: Abnormal   Collection Time: 05/08/19  1:10 PM  Result Value Ref Range Status   SARS Coronavirus 2 POSITIVE (A) NEGATIVE Final    Comment: RESULT CALLED TO, READ BACK BY AND VERIFIED WITH: Bedelia Person RN 14:30 05/08/19 ([email or web address]) (NOTE) If result is NEGATIVE SARS-CoV-2 target nucleic acids are NOT DETECTED. The SARS-CoV-2 RNA is generally detectable in upper and lower  respiratory specimens during the acute phase of infection. The lowest    concentration of SARS-CoV-2 viral copies this assay can detect is 250  copies / mL. A negative result does not preclude SARS-CoV-2 infection  and should not be used as the sole basis for treatment or other  patient management decisions.  A negative result may occur with  improper specimen collection / handling, submission of specimen other  than nasopharyngeal swab, presence of viral mutation(s) within the  areas targeted by this assay, and inadequate number of viral copies  (<250 copies / mL). A negative result must be combined with clinical  observations, patient history, and epidemiological information. If result is POSITIVE SARS-CoV-2 target nucleic acids are DETECTED. T he SARS-CoV-2 RNA is generally detectable in upper and lower  respiratory specimens during the acute phase of infection.  Positive  results are indicative of active infection with SARS-CoV-2.  Clinical  correlation with patient history and other diagnostic information is  necessary to determine patient infection status.  Positive results do  not rule out bacterial infection or co-infection with other viruses. If result is PRESUMPTIVE POSTIVE SARS-CoV-2 nucleic acids MAY BE PRESENT.   A presumptive positive result was obtained on the submitted specimen  and confirmed on repeat testing.  While 2019 novel coronavirus  (SARS-CoV-2) nucleic acids may be present in the submitted sample  additional confirmatory testing may be necessary for epidemiological  and / or clinical management purposes  to differentiate between  SARS-CoV-2 and other Sarbecovirus currently known to infect humans.  If clinically indicated additional testing with an alternate test  methodology [id number]) is  advised. The SARS-CoV-2 RNA is generally  detectable in upper and lower respiratory specimens during the acute  phase of infection. The expected result is Negative. Fact Sheet for Patients:  BoilerBrush.com.cy Fact Sheet  for Healthcare Providers: https://pope.com/ This test is not yet approved or cleared by the [place] FDA and has been authorized for detection and/or diagnosis of SARS-CoV-2 by FDA under an Emergency Use Authorization (EUA).  This EUA will remain in effect (meaning this test can be used) for the duration of the COVID-19 declaration under Section 564(b)(1) of the Act, 21 U.S.C. section 360bbb-3(b)(1), unless the authorization is terminated or revoked sooner. Performed at [facility], [address], [place], [place] [address]   Blood Culture (routine x 2)     Status: None (Preliminary result)   Collection Time: 05/08/19  1:15 PM  Result Value Ref Range Status   Specimen Description BLOOD LEFT ANTECUBITAL  Final   Special Requests   Final    BOTTLES DRAWN AEROBIC AND ANAEROBIC Blood Culture adequate volume   Culture   Final    NO GROWTH 4 DAYS Performed at [facility], [address], [place], [place] [address]    Report Status PENDING  Incomplete  Blood Culture (routine x 2)     Status: None (Preliminary result)   Collection Time: 05/08/19  1:30 PM  Result Value Ref Range Status   Specimen Description BLOOD RIGHT ANTECUBITAL  Final   Special Requests   Final    BOTTLES DRAWN AEROBIC AND ANAEROBIC Blood Culture adequate volume   Culture   Final    NO GROWTH 4 DAYS Performed at [facility], [address] [place], [place] [address]    Report Status PENDING  Incomplete  Urine culture     Status: None   Collection Time: 05/08/19  4:51 PM  Result Value Ref Range Status   Specimen Description URINE, RANDOM  Final   Special Requests NONE  Final   Culture   Final    NO GROWTH Performed at [facility], [address], [place], [place] [address]    Report Status 05/09/2019 FINAL  Final    Radiology Reports Ct Head Wo Contrast  Result Date: 05/12/2019 CLINICAL DATA:  85 year old male with failure to thrive, altered mental  status. EXAM: CT HEAD WITHOUT CONTRAST TECHNIQUE: Contiguous axial images were obtained from the base of the skull through the vertex without intravenous contrast. COMPARISON:  Head CT 01/09/2018. FINDINGS: Brain: Study is intermittently degraded by motion artifact despite repeated imaging attempts. Fairly symmetric bilateral subdural hygromas or chronic hematomas have increased since 2019 most apparent on the coronal views where the subdural veins are more displaced from the inner table (coronal series 10, images 40 and 41). The collections measure 4-5 millimeters on the left and 3-4 millimeters on the right. There is no associated midline shift. Mild mass effect on  both hemispheres. Basilar cisterns remain normal. No superimposed acute intracranial hemorrhage. Patchy and confluent bilateral cerebral white matter hypodensity and evidence of small chronic lacunar infarcts in the right basal ganglia and pons are stable. No ventriculomegaly. No cortically based acute infarct identified. Vascular: Calcified atherosclerosis at the skull base. No suspicious intracranial vascular hyperdensity. Skull: Mild motion artifact. No acute osseous abnormality identified. Sinuses/Orbits: Bubbly opacity in the bilateral maxillary sinuses and left sphenoid with small fluid levels. Chronic sinus mucoperiosteal thickening. The frontal sinuses today are clear. Tympanic cavities and mastoids are clear. Other: No acute orbit or scalp soft tissue findings. IMPRESSION: 1. 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 Mild associated mass effect on both hemispheres with no midline shift. 2. No other acute intracranial abnormality. Chronic small vessel disease is stable since 2019. 3. Positive also for acute on chronic paranasal sinusitis. Electronically Signed   By: Odessa FlemingH  Hall M.D.   On: 05/12/2019 19:47   Dg Chest Port 1 View  Result Date:  05/13/2019 CLINICAL DATA:  Shortness of breath EXAM: PORTABLE CHEST 1 VIEW COMPARISON:  05/12/2019 FINDINGS: There is bilateral diffuse mild interstitial thickening likely chronic. There is increased airspace disease in the right lower lobe and periphery of the left mid lung concerning for superimposed pneumonia. No pleural effusion or pneumothorax. The heart mediastinum are stable. The osseous structures are unremarkable. IMPRESSION: Mild bilateral chronic interstitial lung disease. Increased airspace disease in the right lower lobe and periphery of the left mid lung concerning for superimposed pneumonia. Electronically Signed   By: Elige KoHetal  Patel   On: 05/13/2019 09:39   Dg Chest Port 1 View  Result Date: 05/12/2019 CLINICAL DATA:  Positive COVID-19.  Failure to thrive. EXAM: PORTABLE CHEST 1 VIEW COMPARISON:  May 08, 2019 FINDINGS: There is patchy opacity in the right base. The lungs elsewhere are clear. Heart is upper normal in size with pulmonary vascularity normal. No adenopathy. There is aortic atherosclerosis. No bone lesions. IMPRESSION: Patchy infiltrate consistent with pneumonia right base. Lungs elsewhere clear. Heart upper normal in size. No adenopathy evident. Aortic Atherosclerosis (ICD10-I70.0). Electronically Signed   By: Bretta BangWilliam  Woodruff III M.D.   On: 05/12/2019 19:03   Dg Chest Port 1 View  Result Date: 05/08/2019 CLINICAL DATA:  Fevers EXAM: PORTABLE CHEST 1 VIEW COMPARISON:  01/09/2018 FINDINGS: Cardiac shadow is stable. Aortic calcifications are noted. The lungs are well aerated bilaterally. No focal infiltrate is seen. Mild increased interstitial markings are noted stable from the prior exam. This may represent mild edema. No bony abnormality is seen. IMPRESSION: Stable mild interstitial changes which may represent edema. Electronically Signed   By: Alcide CleverMark  Lukens M.D.   On: 05/08/2019 14:44

## 2019-05-13 NOTE — Progress Notes (Signed)
Unable to complete order for prone positioning x 6 hours daily d/t pt keeps in fetal position and significant language/dialect barrier. RN will turn patient to side or back, pt will readjust to fetal positioning with knees pulled up to left of body and arms on chest. VSS, does not appear in discomfort, will continue to monitor. Pt refused breakfast and lunch today, awaiting SLP eval. Pt refuse protonix pill at this time, will try again with evening meds.

## 2019-05-13 NOTE — Progress Notes (Addendum)
Called patient's granddaughter at [phone] to give update that her he has arrived her at this hospital and in room 149. I stated tat he is resting comfortably at this time. She appreciated the call.

## 2019-05-13 NOTE — Evaluation (Signed)
Clinical/Bedside Swallow Evaluation Patient Details  Name: Martin Tanner MRN: [id number] Date of Birth: 04/18/1928  Today's Date: 05/13/2019 Time: SLP Start Time (ACUTE ONLY): 1114 SLP Stop Time (ACUTE ONLY): 1141 SLP Time Calculation (min) (ACUTE ONLY): 27 min  Past Medical History:  Past Medical History:  Diagnosis Date  . Anemia   . BPH (benign prostatic hyperplasia)   . Chronic kidney disease   . Upper GI bleed 11/17/2017   Past Surgical History:  Past Surgical History:  Procedure Laterality Date  . ESOPHAGOGASTRODUODENOSCOPY (EGD) WITH PROPOFOL N/A 11/18/2017   Procedure: ESOPHAGOGASTRODUODENOSCOPY (EGD) WITH PROPOFOL;  Surgeon: Vida RiggerMagod, Marc, MD;  [facility];  Service: Endoscopy;  Laterality: N/A;  . None    . TRANSURETHRAL RESECTION OF PROSTATE N/A 03/16/2013   Procedure: TRANSURETHRAL RESECTION OF THE PROSTATE WITH GYRUS INSTRUMENTS;  Surgeon: Marcine MatarStephen Dahlstedt, MD;  Location: [facility] ORS;  Service: Urology;  Laterality: N/A;   HPI:  Martin Tanner with advanced dementia, CKD stage III, chronic pancytopenia, anemia who lives with family now admitted with COVID 19 PNA.     Assessment / Plan / Recommendation Clinical Impression  Pt was seen for swallow evaluation with use of [employer] interpreter ([employer] [phone]), although pt still demonstrated limited command following and verbal output. His swallowing appears consistent with a cognitively-based oral dysphagia. He would only take POs when they were self-fed, resistant even to SLP attempts to facilitate self-feeding to get larger bites at a time. He took minimal amounts of puree from the spoon at a time, almost licking it. It took him several minutes to get one spoonful at a time. More solid textures were therefore not administered. Oral holding was observed intermittently with thin liquids. With the exception of coughing after his very first sip, he did not show other signs of aspiration. Recommend  downgrading diet to Dys 1 solids, thin liquids. Would facilitate self-feeding as much as possible and try to offer solids more frequently throughout the day as intake may be very limited.  SLP Visit Diagnosis: Dysphagia, unspecified (R13.10)    Aspiration Risk  Mild aspiration risk;Moderate aspiration risk    Diet Recommendation Dysphagia 1 (Puree);Thin liquid   Liquid Administration via: Cup;Straw Medication Administration: Crushed with puree Supervision: Staff to assist with self feeding;Full supervision/cueing for compensatory strategies Compensations: Minimize environmental distractions;Slow rate;Small sips/bites Postural Changes: Seated upright at 90 degrees    Other  Recommendations Oral Care Recommendations: Oral care BID   Follow up Recommendations 24 hour supervision/assistance      Frequency and Duration min 2x/week  2 weeks       Prognosis Prognosis for Safe Diet Advancement: Fair Barriers to Reach Goals: Cognitive deficits      Swallow Study   General HPI: Martin Tanner with advanced dementia, CKD stage III, chronic pancytopenia, anemia who lives with family now admitted with COVID 19 PNA.   Type of Study: Bedside Swallow Evaluation Previous Swallow Assessment: none in chart Diet Prior to this Study: Dysphagia 3 (soft);Thin liquids Temperature Spikes Noted: Yes Respiratory Status: Room air History of Recent Intubation: No Behavior/Cognition: Alert;Requires cueing;Distractible Oral Cavity Assessment: (needs further assessment - doesn't let me look) Oral Care Completed by SLP: No Oral Cavity - Dentition: Poor condition;Other (Comment)(needs further assessment, unsure if some are missing) Vision: Functional for self-feeding Self-Feeding Abilities: Able to feed self Patient Positioning: Upright in bed(needs repositioning throughout intake) Baseline Vocal Quality: Low vocal intensity Volitional Swallow: Unable to elicit    Oral/Motor/Sensory  Function Overall Oral Motor/Sensory Function: (not following commands to assess but appears symmetrical)   Ice Chips Ice chips: Not tested   Thin Liquid Thin Liquid: Impaired Presentation: Self Fed;Straw Oral Phase Functional Implications: Oral holding Pharyngeal  Phase Impairments: Cough - Immediate    Nectar Thick Nectar Thick Liquid: Not tested   Honey Thick Honey Thick Liquid: Not tested   Puree Puree: Impaired Presentation: Self Fed;Spoon Oral Phase Impairments: Poor awareness of bolus Oral Phase Functional Implications: Prolonged oral transit   Solid     Solid: Not tested      [name] 05/13/2019,1:55 PM  Ivar Drape, M.A. CCC-SLP Acute Herbalist [phone] Office [phone]

## 2019-05-13 NOTE — Progress Notes (Signed)
Initial Nutrition Assessment  DOCUMENTATION CODES:   Underweight  INTERVENTION:   Ensure Enlive po BID, each supplement provides 350 kcal and 20 grams of protein  Hormel shake with Breakfast, each supplement provides 480-500 kcals and 20-23 grams of protein  Magic cup with Lunch and Dinner, each supplement provides 290 kcal and 9 grams of protein   NUTRITION DIAGNOSIS:   Increased nutrient needs related to acute illness(COVID) as evidenced by estimated needs.  GOAL:   Patient will meet greater than or equal to 90% of their needs  MONITOR:   PO intake, Supplement acceptance  REASON FOR ASSESSMENT:   (Low BMI)    ASSESSMENT:   85 year old Nepalese speaking male with advanced dementia, CKD stage III, chronic pancytopenia, anemia who lives with family now admitted with COVID 19 PNA.     Per MD pt is not a candidate for heroic measures due to his underlying advanced dementia, frail status, and chronic hygromas and is a DNR but full medical treatment will be continued.   Medications reviewed and include: solumedrol Labs reviewed: Ferritin: 1834 (H)    NUTRITION - FOCUSED PHYSICAL EXAM:  Deferred   Diet Order:   Diet Order            DIET SOFT Room service appropriate? Yes; Fluid consistency: Thin  Diet effective now              EDUCATION NEEDS:   No education needs have been identified at this time  Skin:  Skin Assessment: Reviewed RN Assessment  Last BM:  unknown  Height:   Ht Readings from Last 1 Encounters:  05/12/19 5\' 3"  (1.6 m)    Weight:   Wt Readings from Last 1 Encounters:  05/12/19 45.4 kg    Ideal Body Weight:  56.3 kg  BMI:  Body mass index is 17.71 kg/m.  Estimated Nutritional Needs:   Kcal:  1400-1600  Protein:  75-90 grams  Fluid:  > 1.5 L/day  Kendell Bane RD, LDN, CNSC [phone] Pager [phone] After Hours Pager

## 2019-05-13 NOTE — ED Notes (Signed)
[facility] at bedside 

## 2019-05-13 NOTE — ED Notes (Signed)
Paperwork printed and [facility] called

## 2019-05-13 NOTE — Evaluation (Signed)
Physical Therapy Evaluation Patient Details Name: Martin Tanner MRN: [id number] DOB: 10/20/1928 Today's Date: 05/13/2019   History of Present Illness  85 year old Nepalese speaking gentleman with advanced underlying dementia, chronic kidney disease stage III, chronic pancytopenia, anemia, history of UGI bleed in the past, BPH.  He lives [place] son and family members in [place].  He was brought here for running fevers chest x-ray assessed of a viral pneumonia, CT head showed Chronic Hygromas he also tested positive for COVID-19 and he was admitted.  Clinical Impression  The patient  Was alert, rolled  On side , incontinent of urine. Assisted for bed change. Interpreter used, patient attentive and followed some directions. Voice very soft  And interpreter had difficulty. Patient is extremely weak. Will contact family for prior level of care. Pt admitted with above diagnosis. Pt currently with functional limitations due to the deficits listed below (see PT Problem List). Pt will benefit from skilled PT to increase their independence and safety with mobility to allow discharge to the venue listed below.    Appears    Follow Up Recommendations (tbd)    Equipment Recommendations  None recommended by PT    Recommendations for Other Services       Precautions / Restrictions Precautions Precaution Comments: incontinent Restrictions    Mobility  Bed Mobility Overal bed mobility: Needs Assistance Bed Mobility: Rolling;Supine to Sit;Sit to Supine Rolling: Max assist   Supine to sit: Max assist Sit to supine: Max assist   General bed mobility comments: even with interpreter, required extensive assistance and multimodal cues.  Transfers                 General transfer comment: NT  Ambulation/Gait                Stairs            Wheelchair Mobility    Modified Rankin (Stroke Patients Only)       Balance Overall balance assessment: Needs  assistance Sitting-balance support: No upper extremity supported;Feet supported;Bilateral upper extremity supported   Sitting balance - Comments: while sitting, patient frequently dropped his head, constant fidgeting. Appears severely weak.                                     Pertinent Vitals/Pain Faces Pain Scale: No hurt    Home Living Family/patient expects to be discharged to:: Private residence Living Arrangements: Other relatives;Children Available Help at Discharge: Family Type of Home: House Home Access: Stairs to enter   [employer]tr[employer] Stairs-Number of Steps: 2 Home Layout: One level Home Equipment: Walker - 2 wheels Additional Comments: info from previous encounter    Prior Function           Comments: per notes, patient has been weak, not eating drinking., in ED 5/15      Hand Dominance        Extremity/Trunk Assessment   Upper Extremity Assessment Upper Extremity Assessment: Generalized weakness    Lower Extremity Assessment Lower Extremity Assessment: Generalized weakness    Cervical / Trunk Assessment Cervical / Trunk Assessment: Kyphotic  Communication   Communication: Prefers language other than English(nepali- Ischwar [id number])  Cognition Arousal/Alertness: Awake/alert Behavior During Therapy: Restless Overall Cognitive Status: Difficult to assess  General Comments: per interpreter, pt. attempting but is unable to fully express except did understand patient to say that he did not know where he was or why      General Comments      Exercises     Assessment/Plan    PT Assessment Patient needs continued PT services  PT Problem List Decreased strength;Decreased activity tolerance;Decreased balance;Decreased mobility;Decreased knowledge of precautions;Decreased safety awareness;Decreased knowledge of use of DME       PT Treatment Interventions DME instruction;Therapeutic  exercise;Functional mobility training;Patient/family education;Therapeutic activities    PT Goals (Current goals can be found in the Care Plan section)  Acute Rehab PT Goals PT Goal Formulation: Patient unable to participate in goal setting Time For Goal Achievement: 05/20/19 Potential to Achieve Goals: Fair    Frequency Min 3X/week   Barriers to discharge        Co-evaluation               AM-PAC PT "6 Clicks" Mobility  Outcome Measure Help needed turning from your back to your side while in a flat bed without using bedrails?: Total Help needed moving from lying on your back to sitting on the side of a flat bed without using bedrails?: Total Help needed moving to and from a bed to a chair (including a wheelchair)?: Total Help needed standing up from a chair using your arms (e.g., wheelchair or bedside chair)?: Total Help needed to walk in hospital room?: Total Help needed climbing 3-5 steps with a railing? : Total 6 Click Score: 6    End of Session   Activity Tolerance: Patient limited by fatigue Patient left: in bed;with call bell/phone within reach(bed alarm did not set, RN made aware, no further measures taken) Nurse Communication: Mobility status PT Visit Diagnosis: Unsteadiness on feet (R26.81);Adult, failure to thrive (R62.7)    Time: 8088-1103 PT Time Calculation (min) (ACUTE ONLY): 33 min   Charges:   PT Evaluation $PT Eval Moderate Complexity: 1 Mod PT Treatments $Therapeutic Activity: 8-22 mins        Blanchard Kelch PT Acute Rehabilitation Services Pager [phone] Office [phone]   [name] 05/13/2019, 5:02 PM

## 2019-05-13 NOTE — Progress Notes (Signed)
For lab orders, if orders are placed at [place], they do not cross over to [facility]. Phlebotomist stated she was going to notify the [facility] so that they can let the doctors know. I have re-entered them on this patient.

## 2019-05-13 NOTE — Progress Notes (Signed)
CRITICAL VALUE ALERT  Critical Value:  Troponin  Date & Time Notied:  05-13-19 0652  Provider Notified: None  Orders Received/Actions taken: Troponin trending downward

## 2019-05-14 DIAGNOSIS — H539 Unspecified visual disturbance: Secondary | ICD-10-CM | POA: Diagnosis not present

## 2019-05-14 DIAGNOSIS — R1312 Dysphagia, oropharyngeal phase: Secondary | ICD-10-CM

## 2019-05-14 DIAGNOSIS — U071 COVID-19: Principal | ICD-10-CM

## 2019-05-14 DIAGNOSIS — I10 Essential (primary) hypertension: Secondary | ICD-10-CM

## 2019-05-14 DIAGNOSIS — G960 Cerebrospinal fluid leak: Secondary | ICD-10-CM

## 2019-05-14 LAB — COMPREHENSIVE METABOLIC PANEL
ALT: 28 U/L (ref 0–44)
AST: 80 U/L — ABNORMAL HIGH (ref 15–41)
Albumin: 2.6 g/dL — ABNORMAL LOW (ref 3.5–5.0)
Alkaline Phosphatase: 47 U/L (ref 38–126)
Anion gap: 11 (ref 5–15)
BUN: 40 mg/dL — ABNORMAL HIGH (ref 8–23)
CO2: 19 mmol/L — ABNORMAL LOW (ref 22–32)
Calcium: 8.6 mg/dL — ABNORMAL LOW (ref 8.9–10.3)
Chloride: 110 mmol/L (ref 98–111)
Creatinine, Ser: 1.39 mg/dL — ABNORMAL HIGH (ref 0.61–1.24)
GFR calc Af Amer: 52 mL/min — ABNORMAL LOW (ref >60)
GFR calc non Af Amer: 45 mL/min — ABNORMAL LOW (ref >60)
Glucose, Bld: 150 mg/dL — ABNORMAL HIGH (ref 70–99)
Potassium: 4 mmol/L (ref 3.5–5.1)
Sodium: 140 mmol/L (ref 135–145)
Total Bilirubin: 1 mg/dL (ref 0.3–1.2)
Total Protein: 6.6 g/dL (ref 6.5–8.1)

## 2019-05-14 LAB — CBC WITH DIFFERENTIAL/PLATELET
Abs Immature Granulocytes: 0.02 10*3/uL (ref 0.00–0.07)
Basophils Absolute: 0 10*3/uL (ref 0.0–0.1)
Basophils Relative: 1 %
Eosinophils Absolute: 0 10*3/uL (ref 0.0–0.5)
Eosinophils Relative: 0 %
HCT: 37 % — ABNORMAL LOW (ref 39.0–52.0)
Hemoglobin: 12.6 g/dL — ABNORMAL LOW (ref 13.0–17.0)
Immature Granulocytes: 1 %
Lymphocytes Relative: 21 %
Lymphs Abs: 0.5 10*3/uL — ABNORMAL LOW (ref 0.7–4.0)
MCH: 30.6 pg (ref 26.0–34.0)
MCHC: 34.1 g/dL (ref 30.0–36.0)
MCV: 89.8 fL (ref 80.0–100.0)
Monocytes Absolute: 0.1 10*3/uL (ref 0.1–1.0)
Monocytes Relative: 3 %
Neutro Abs: 1.6 10*3/uL — ABNORMAL LOW (ref 1.7–7.7)
Neutrophils Relative %: 74 %
Platelets: 93 10*3/uL — ABNORMAL LOW (ref 150–400)
RBC: 4.12 MIL/uL — ABNORMAL LOW (ref 4.22–5.81)
RDW: 14.3 % (ref 11.5–15.5)
WBC: 2.2 10*3/uL — ABNORMAL LOW (ref 4.0–10.5)
nRBC: 0 % (ref 0.0–0.2)

## 2019-05-14 LAB — BLOOD CULTURE ID PANEL (REFLEXED)

## 2019-05-14 LAB — C-REACTIVE PROTEIN: CRP: 14.7 mg/dL — ABNORMAL HIGH (ref <1.0)

## 2019-05-14 LAB — URINE CULTURE: Culture: NO GROWTH

## 2019-05-14 LAB — D-DIMER, QUANTITATIVE: D-Dimer, Quant: 3.28 ug/mL-FEU — ABNORMAL HIGH (ref 0.00–0.50)

## 2019-05-14 LAB — MAGNESIUM: Magnesium: 2.2 mg/dL (ref 1.7–2.4)

## 2019-05-14 LAB — INTERLEUKIN-6, PLASMA: Interleukin-6, Plasma: 151.9 pg/mL — ABNORMAL HIGH (ref 0.0–12.2)

## 2019-05-14 LAB — FERRITIN: Ferritin: 1366 ng/mL — ABNORMAL HIGH (ref 24–336)

## 2019-05-14 MED ORDER — ONDANSETRON HCL 4 MG/2ML IJ SOLN: 4.0000 mg | Freq: Four times a day (QID) | INTRAMUSCULAR | Status: DC | PRN

## 2019-05-14 MED ORDER — ENOXAPARIN SODIUM 30 MG/0.3ML ~~LOC~~ SOLN
30.0000 mg | SUBCUTANEOUS | Status: DC
  Administered 2019-05-16 – 2019-05-20 (×5): 30 mg via SUBCUTANEOUS
  Filled 2019-05-14 (×6): qty 0.3

## 2019-05-14 MED ORDER — VITAMIN C 500 MG/5ML PO SYRP
500.0000 mg | ORAL_SOLUTION | Freq: Every day | ORAL | Status: DC
  Filled 2019-05-14: qty 5

## 2019-05-14 MED ORDER — ALBUTEROL SULFATE HFA 108 (90 BASE) MCG/ACT IN AERS
2.0000 | INHALATION_SPRAY | RESPIRATORY_TRACT | Status: DC | PRN
  Filled 2019-05-14: qty 6.7

## 2019-05-14 MED ORDER — ZINC SULFATE 220 (50 ZN) MG PO CAPS
220.0000 mg | ORAL_CAPSULE | Freq: Every day | ORAL | Status: DC
  Administered 2019-05-14 – 2019-05-21 (×8): 220 mg via ORAL
  Filled 2019-05-14 (×8): qty 1

## 2019-05-14 MED ORDER — METHYLPREDNISOLONE SODIUM SUCC 125 MG IJ SOLR
60.0000 mg | Freq: Three times a day (TID) | INTRAMUSCULAR | Status: DC
  Administered 2019-05-14 – 2019-05-15 (×2): 60 mg via INTRAVENOUS
  Filled 2019-05-14 (×2): qty 2

## 2019-05-14 MED ORDER — VITAMIN C 500 MG PO TABS
500.0000 mg | ORAL_TABLET | Freq: Every day | ORAL | Status: DC
  Administered 2019-05-14 – 2019-05-21 (×8): 500 mg via ORAL
  Filled 2019-05-14 (×8): qty 1

## 2019-05-14 NOTE — Progress Notes (Addendum)
PROGRESS NOTE                                                                                                                                                                                                             Patient Demographics:    Martin Tanner, is a 85 y.o. male, DOB - Sep 07, 1928, WJX:[id number]  Outpatient Primary MD for the patient is System, Pcp Not In    LOS - 2  Chief Complaint  Patient presents with  . Weakness  . Urinary Incontinence       Brief Narrative: Patient is a 85 y.o. male with PMHx of dementia, CKD stage III, presented with fever and altered mental status-found to have COVID-19 pneumonia.  See below for further details   Subjective:    Martin Tanner today remains confused-only follows some commands (this MD speaks Nepali)   Assessment  & Plan :   Pneumonia secondary to COVID 19: Although not requiring oxygen-he is very frail and chronically sick appearing at baseline-has potential to deteriorate further.  His inflammatory markers including CRP/ferritin are still elevated.  We will started on steroids yesterday-we will start to taper.  Agree with Dr. Thedore Mins (prior hospitalist MD)-not a good candidate for aggressive care-suspect if he deteriorates-he is best served by transitioning to comfort measures.  Discussion with patient's grandson over the phone-he is aware of this tenuous clinical situation.  DNR in place  Gram-positive cocci bacteremia: 1/2 cultures positive for gram-positive cocci-thought to be contaminant-since relatively stable-can watch off antimicrobial therapy until further culture results are obtained.  Severe debility/deconditioning: Very frail and weak-major barrier to discharge.  Needs PT/OT.  Family aware that patient may never recover to his prior level of functioning.  Suspect will require SNF on discharge.  Dysphagia: Suspect dysphagia secondary to dementia-probably worsened by   Pancytopenia: Appears to be chronic-probably worsened due to COVID-19.  Continue to follow  Hypertension: Controlled with Coreg  Dementia: Pleasantly confused-at risk for delirium.  COVID-19 Labs:  Recent Labs    05/13/19 0525 05/13/19 0527 05/13/19 0528 05/14/19 0614 05/14/19 0619  DDIMER  --  4.09*  --  3.28*  --   FERRITIN  --   --  1,834*  --  1,366*  CRP 15.1*  --   --   --  14.7*    Lab Results  Component Value  Date   SARSCOV2NAA POSITIVE (A) 05/08/2019     Vent Settings:    ABG: No results found for: PHART, PCO2ART, PO2ART, HCO3, TCO2, ACIDBASEDEF, O2SAT  Condition - Extremely Guarded  Family Communication  : Grandson over the phone  Code Status : DNR  Diet : Dysphagia 1 diet  Disposition Plan  :  Remain inpatient  Consults  :  PCCM  Procedures  :    ETT>>  GI prophylaxis: PPI/H2 Blocker  DVT Prophylaxis  :  Start Lovenox -watch platelets  Lab Results  Component Value Date   PLT 93 (L) 05/14/2019    Inpatient Medications  Scheduled Meds: . carvedilol  3.125 mg Oral BID WC  . feeding supplement (ENSURE ENLIVE)  237 mL Oral BID BM  . methylPREDNISolone (SOLU-MEDROL) injection  80 mg Intravenous Q6H  . pantoprazole  40 mg Oral Daily   Continuous Infusions: PRN Meds:.  Antibiotics  :    Anti-infectives (From admission, onward)   Start     Dose/Rate Route Frequency Ordered Stop   05/13/19 1800  cefTRIAXone (ROCEPHIN) 1 g in sodium chloride 0.9 % 100 mL IVPB  Status:  Discontinued     1 g 200 mL/hr over 30 Minutes Intravenous Every 24 hours 05/13/19 0232 05/13/19 1106   05/13/19 1800  azithromycin (ZITHROMAX) 500 mg in sodium chloride 0.9 % 250 mL IVPB  Status:  Discontinued     500 mg 250 mL/hr over 60 Minutes Intravenous Every 24 hours 05/13/19 0232 05/13/19 1106   05/12/19 2030  azithromycin (ZITHROMAX) 500 mg in sodium chloride 0.9 % 250 mL IVPB     500 mg 250 mL/hr over 60 Minutes Intravenous  Once 05/12/19 2015 05/12/19 2158    05/12/19 1730  cefTRIAXone (ROCEPHIN) 1 g in sodium chloride 0.9 % 100 mL IVPB     1 g 200 mL/hr over 30 Minutes Intravenous  Once 05/12/19 1723 05/12/19 1836       Time Spent in minutes 35    Jeoffrey Massed M.D on 05/14/2019 at 1:07 PM  To page go to [email or web address] - use universal password  [facility] -  Office  [phone]  See all Orders from today for further details   Admit date - 05/12/2019    2    Objective:   Vitals:   05/13/19 0828 05/13/19 2138 05/14/19 0045 05/14/19 1039  BP: 123/73 111/62 118/73 110/79  Pulse: 86   72  Resp: 18   18  Temp: 99.7 F (37.6 C) 97.9 F (36.6 C) 97.9 F (36.6 C) 97.9 F (36.6 C)  TempSrc: Axillary Oral Oral Oral  SpO2: (!) 55%   100%  Weight:      Height:        Wt Readings from Last 3 Encounters:  05/12/19 45.4 kg  01/11/18 44.6 kg  11/18/17 43.5 kg    No intake or output data in the 24 hours ending 05/14/19 1307   Physical Exam Gen Exam:-not in any distress/pleasantly confused-chronically sick appearing HEENT:atraumatic, normocephalic Chest: B/L clear to auscultation anteriorly CVS:S1S2 regular Abdomen:soft non tender, non distended Extremities:no edema Neurology: Has generalized weakness but appears nonfocal Skin: no rash   Data Review:    CBC Recent Labs  Lab 05/08/19 1330 05/12/19 2040 05/13/19 0527 05/14/19 0614  WBC 2.1* 2.6* 3.1* 2.2*  HGB 12.3* 11.1* 12.2* 12.6*  HCT 36.1* 33.3* 36.2* 37.0*  PLT PLATELET CLUMPS NOTED ON SMEAR, COUNT APPEARS DECREASED 80* 95* 93*  MCV 88.7 91.0 89.4 89.8  MCH  30.2 30.3 30.1 30.6  MCHC 34.1 33.3 33.7 34.1  RDW 13.8 14.1 14.0 14.3  LYMPHSABS 0.5*  --  0.5* 0.5*  MONOABS 0.2  --  0.1 0.1  EOSABS 0.0  --  0.0 0.0  BASOSABS 0.0  --  0.0 0.0    Chemistries  Recent Labs  Lab 05/08/19 1330 05/12/19 2040 05/13/19 0527 05/14/19 0614  NA 136 139 140 140  K 5.0 3.7 3.6 4.0  CL 105 109 110 110  CO2 21* 21* 19* 19*  GLUCOSE 94 102* 99 150*  BUN 24*  23 22 40*  CREATININE 1.57* 1.30* 1.34* 1.39*  CALCIUM 8.6* 7.6* 8.2* 8.6*  MG  --   --   --  2.2  AST 56* 67* 84* 80*  ALT ALKPHOS 65 46 51 47  BILITOT 1.1 0.9 0.7 1.0   ------------------------------------------------------------------------------------------------------------------ Recent Labs    05/13/19 0527  TRIG 92    No results found for: HGBA1C ------------------------------------------------------------------------------------------------------------------ No results for input(s): TSH, T4TOTAL, T3FREE, THYROIDAB in the last 72 hours.  Invalid input(s): FREET3 ------------------------------------------------------------------------------------------------------------------ Recent Labs    05/13/19 0528 05/14/19 0619  FERRITIN 1,834* 1,366*    Coagulation profile No results for input(s): INR, PROTIME in the last 168 hours.  Recent Labs    05/13/19 0527 05/14/19 0614  DDIMER 4.09* 3.28*    Cardiac Enzymes Recent Labs  Lab 05/12/19 2040 05/13/19 0106 05/13/19 0528  TROPONINI 0.24* 0.26* 0.24*   ------------------------------------------------------------------------------------------------------------------ No results found for: BNP  Micro Results Recent Results (from the past 240 hour(s))  SARS Coronavirus 2 (CEPHEID - Performed in [facility] hospital lab), Hosp Order     Status: Abnormal   Collection Time: 05/08/19  1:10 PM  Result Value Ref Range Status   SARS Coronavirus 2 POSITIVE (A) NEGATIVE Final    Comment: RESULT CALLED TO, READ BACK BY AND VERIFIED WITH: Bedelia Person RN 14:30 05/08/19 ([email or web address]) (NOTE) If result is NEGATIVE SARS-CoV-2 target nucleic acids are NOT DETECTED. The SARS-CoV-2 RNA is generally detectable in upper and lower  respiratory specimens during the acute phase of infection. The lowest  concentration of SARS-CoV-2 viral copies this assay can detect is 250  copies / mL. A negative result does not preclude  SARS-CoV-2 infection  and should not be used as the sole basis for treatment or other  patient management decisions.  A negative result may occur with  improper specimen collection / handling, submission of specimen other  than nasopharyngeal swab, presence of viral mutation(s) within the  areas targeted by this assay, and inadequate number of viral copies  (<250 copies / mL). A negative result must be combined with clinical  observations, patient history, and epidemiological information. If result is POSITIVE SARS-CoV-2 target nucleic acids are DETECTED. T he SARS-CoV-2 RNA is generally detectable in upper and lower  respiratory specimens during the acute phase of infection.  Positive  results are indicative of active infection with SARS-CoV-2.  Clinical  correlation with patient history and other diagnostic information is  necessary to determine patient infection status.  Positive results do  not rule out bacterial infection or co-infection with other viruses. If result is PRESUMPTIVE POSTIVE SARS-CoV-2 nucleic acids MAY BE PRESENT.   A presumptive positive result was obtained on the submitted specimen  and confirmed on repeat testing.  While 2019 novel coronavirus  (SARS-CoV-2) nucleic acids may be present in the submitted sample  additional confirmatory testing may be necessary for epidemiological  and /  or clinical management purposes  to differentiate between  SARS-CoV-2 and other Sarbecovirus currently known to infect humans.  If clinically indicated additional testing with an alternate test  methodology [phone]([id number]) is  advised. The SARS-CoV-2 RNA is generally  detectable in upper and lower respiratory specimens during the acute  phase of infection. The expected result is Negative. Fact Sheet for Patients:  BoilerBrush.com.cyhttps://www.fda.gov/media/136312/download Fact Sheet for Healthcare Providers: https://pope.com/https://www.fda.gov/media/136313/download This test is not yet approved or cleared by the  [place] FDA and has been authorized for detection and/or diagnosis of SARS-CoV-2 by FDA under an Emergency Use Authorization (EUA).  This EUA will remain in effect (meaning this test can be used) for the duration of the COVID-19 declaration under Section 564(b)(1) of the Act, 21 U.S.C. section 360bbb-3(b)(1), unless the authorization is terminated or revoked sooner. Performed at [facility], [address] [place], [place] [address]   Blood Culture (routine x 2)     Status: None   Collection Time: 05/08/19  1:15 PM  Result Value Ref Range Status   Specimen Description BLOOD LEFT ANTECUBITAL  Final   Special Requests   Final    BOTTLES DRAWN AEROBIC AND ANAEROBIC Blood Culture adequate volume   Culture   Final    NO GROWTH 5 DAYS Performed at [facility], [address] [place], [place] [address]    Report Status 05/13/2019 FINAL  Final  Blood Culture (routine x 2)     Status: None   Collection Time: 05/08/19  1:30 PM  Result Value Ref Range Status   Specimen Description BLOOD RIGHT ANTECUBITAL  Final   Special Requests   Final    BOTTLES DRAWN AEROBIC AND ANAEROBIC Blood Culture adequate volume   Culture   Final    NO GROWTH 5 DAYS Performed at [facility], [address] [place], [place] [address]    Report Status 05/13/2019 FINAL  Final  Urine culture     Status: None   Collection Time: 05/08/19  4:51 PM  Result Value Ref Range Status   Specimen Description URINE, RANDOM  Final   Special Requests NONE  Final   Culture   Final    NO GROWTH Performed at [facility], [address] [place], [place] [address]    Report Status 05/09/2019 FINAL  Final  Culture, blood (Routine X 2) w Reflex to ID Panel     Status: None (Preliminary result)   Collection Time: 05/12/19  8:40 PM  Result Value Ref Range Status   Specimen Description   Final    BLOOD Performed at [facility], [address] [place], [place] [address]     Special Requests   Final    BOTTLES DRAWN AEROBIC AND ANAEROBIC Blood Culture adequate volume Performed at [facility], [address] [place], [place] [address]    Culture  Setup Time   Final    GRAM POSITIVE COCCI IN CLUSTERS AEROBIC BOTTLE ONLY CRITICAL RESULT CALLED TO, READ BACK BY AND VERIFIED WITH: PHARMD Patrick NorthN BATCHELDER [phone] AT 646AM BY CM Performed at [facility], [address] [place], [place] [address]    Culture GRAM POSITIVE COCCI  Final   Report Status PENDING  Incomplete  Blood Culture ID Panel (Reflexed)     Status: None   Collection Time: 05/12/19  8:40 PM  Result Value Ref Range Status   Enterococcus species NOT DETECTED NOT DETECTED Final  Listeria monocytogenes NOT DETECTED NOT DETECTED Final   Staphylococcus species NOT DETECTED NOT DETECTED Final   Staphylococcus aureus (BCID) NOT DETECTED NOT DETECTED Final   Streptococcus species NOT DETECTED NOT DETECTED Final   Streptococcus agalactiae NOT DETECTED NOT DETECTED Final   Streptococcus pneumoniae NOT DETECTED NOT DETECTED Final   Streptococcus pyogenes NOT DETECTED NOT DETECTED Final   Acinetobacter baumannii NOT DETECTED NOT DETECTED Final   Enterobacteriaceae species NOT DETECTED NOT DETECTED Final   Enterobacter cloacae complex NOT DETECTED NOT DETECTED Final   Escherichia coli NOT DETECTED NOT DETECTED Final   Klebsiella oxytoca NOT DETECTED NOT DETECTED Final   Klebsiella pneumoniae NOT DETECTED NOT DETECTED Final   Proteus species NOT DETECTED NOT DETECTED Final   Serratia marcescens NOT DETECTED NOT DETECTED Final   Haemophilus influenzae NOT DETECTED NOT DETECTED Final   Neisseria meningitidis NOT DETECTED NOT DETECTED Final   Pseudomonas aeruginosa NOT DETECTED NOT DETECTED Final   Candida albicans NOT DETECTED NOT DETECTED Final   Candida glabrata NOT DETECTED NOT DETECTED Final   Candida krusei NOT DETECTED NOT DETECTED Final   Candida parapsilosis NOT DETECTED NOT  DETECTED Final   Candida tropicalis NOT DETECTED NOT DETECTED Final    Comment: Performed at [facility], [address] [place], [place] [address]  Culture, blood (Routine X 2) w Reflex to ID Panel     Status: None (Preliminary result)   Collection Time: 05/12/19  8:45 PM  Result Value Ref Range Status   Specimen Description   Final    BLOOD Performed at [facility], [address] [place], [place] [address]    Special Requests   Final    BOTTLES DRAWN AEROBIC AND ANAEROBIC Blood Culture adequate volume Performed at [facility] [address], [place], [place] [address]    Culture   Final    NO GROWTH 2 DAYS Performed at [facility], [address], [place], [place] [address]    Report Status PENDING  Incomplete  Culture, Urine     Status: None   Collection Time: 05/12/19 11:45 PM  Result Value Ref Range Status   Specimen Description   Final    URINE, CLEAN CATCH Performed at [facility], [address] [place], [place] [address]    Special Requests   Final    NONE Performed at [facility], [address], [place], [place] [address]    Culture   Final    NO GROWTH Performed at [facility], [address] [place], [place] [address]    Report Status 05/14/2019 FINAL  Final    Radiology Reports Ct Head Wo Contrast  Result Date: 05/12/2019 CLINICAL DATA:  85 year old male with failure to thrive, altered mental status. EXAM: CT HEAD WITHOUT CONTRAST TECHNIQUE: Contiguous axial images were obtained from the base of the skull through the vertex without intravenous contrast. COMPARISON:  Head CT 01/09/2018. FINDINGS: Brain: Study is intermittently degraded by motion artifact despite repeated imaging attempts. Fairly symmetric bilateral subdural hygromas or chronic hematomas have increased since 2019 most apparent on the coronal views where the subdural veins are more displaced  from the inner table (coronal series 10, images 40 and 41). The collections measure 4-5 millimeters on the left and 3-4 millimeters on the right. There is no associated midline shift. Mild mass effect on both hemispheres. Basilar cisterns remain normal. No superimposed acute intracranial hemorrhage. Patchy and confluent bilateral cerebral  white matter hypodensity and evidence of small chronic lacunar infarcts in the right basal ganglia and pons are stable. No ventriculomegaly. No cortically based acute infarct identified. Vascular: Calcified atherosclerosis at the skull base. No suspicious intracranial vascular hyperdensity. Skull: Mild motion artifact. No acute osseous abnormality identified. Sinuses/Orbits: Bubbly opacity in the bilateral maxillary sinuses and left sphenoid with small fluid levels. Chronic sinus mucoperiosteal thickening. The frontal sinuses today are clear. Tympanic cavities and mastoids are clear. Other: No acute orbit or scalp soft tissue findings. IMPRESSION: 1. 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 Mild associated mass effect on both hemispheres with no midline shift. 2. No other acute intracranial abnormality. Chronic small vessel disease is stable since 2019. 3. Positive also for acute on chronic paranasal sinusitis. Electronically Signed   By: Odessa Fleming M.D.   On: 05/12/2019 19:47   Dg Chest Port 1 View  Result Date: 05/13/2019 CLINICAL DATA:  Shortness of breath EXAM: PORTABLE CHEST 1 VIEW COMPARISON:  05/12/2019 FINDINGS: There is bilateral diffuse mild interstitial thickening likely chronic. There is increased airspace disease in the right lower lobe and periphery of the left mid lung concerning for superimposed pneumonia. No pleural effusion or pneumothorax. The heart mediastinum are stable. The osseous structures are unremarkable. IMPRESSION: Mild bilateral chronic interstitial lung  disease. Increased airspace disease in the right lower lobe and periphery of the left mid lung concerning for superimposed pneumonia. Electronically Signed   By: Elige Ko   On: 05/13/2019 09:39   Dg Chest Port 1 View  Result Date: 05/12/2019 CLINICAL DATA:  Positive COVID-19.  Failure to thrive. EXAM: PORTABLE CHEST 1 VIEW COMPARISON:  May 08, 2019 FINDINGS: There is patchy opacity in the right base. The lungs elsewhere are clear. Heart is upper normal in size with pulmonary vascularity normal. No adenopathy. There is aortic atherosclerosis. No bone lesions. IMPRESSION: Patchy infiltrate consistent with pneumonia right base. Lungs elsewhere clear. Heart upper normal in size. No adenopathy evident. Aortic Atherosclerosis (ICD10-I70.0). Electronically Signed   By: Bretta Bang III M.D.   On: 05/12/2019 19:03   Dg Chest Port 1 View  Result Date: 05/08/2019 CLINICAL DATA:  Fevers EXAM: PORTABLE CHEST 1 VIEW COMPARISON:  01/09/2018 FINDINGS: Cardiac shadow is stable. Aortic calcifications are noted. The lungs are well aerated bilaterally. No focal infiltrate is seen. Mild increased interstitial markings are noted stable from the prior exam. This may represent mild edema. No bony abnormality is seen. IMPRESSION: Stable mild interstitial changes which may represent edema. Electronically Signed   By: Alcide Clever M.D.   On: 05/08/2019 14:44

## 2019-05-14 NOTE — Progress Notes (Signed)
  Speech Language Pathology Treatment: Dysphagia  Patient Details Name: Herley Aundray Cartlidge MRN: [id number] DOB: [birth year] Today's Date: 05/14/2019 Time: 4103-0131 SLP Time Calculation (min) (ACUTE ONLY): 10 min  Assessment / Plan / Recommendation Clinical Impression  Pt continues to have limited intake, although does seem to prefer liquids over solids. He has mild anterior pooling and generalized oral residuals, but no overt signs of aspiration. Better oral assessment today reveals missing dentition. There is no overt signs of aspiration and his respiratory status is stable. Pt likely has a more chronic oral dysphagia from quality of dentition and more so underlying cognitive impairment. His current diet appears to be safe, although suspect that limited intake is also a more chronic issue. SLP to sign off acutely - please reorder if new difficulties arise.     HPI HPI: 85 year old Nepalese speaking male with advanced dementia, CKD stage III, chronic pancytopenia, anemia who lives with family now admitted with COVID 19 PNA.        SLP Plan  All goals met       Recommendations  Diet recommendations: Dysphagia 1 (puree);Thin liquid Liquids provided via: Straw;Cup Medication Administration: Crushed with puree Supervision: Patient able to self feed;Full supervision/cueing for compensatory strategies Compensations: Minimize environmental distractions;Slow rate;Small sips/bites Postural Changes and/or Swallow Maneuvers: Seated upright 90 degrees                Oral Care Recommendations: Oral care BID Follow up Recommendations: 24 hour supervision/assistance SLP Visit Diagnosis: Dysphagia, unspecified (R13.10) Plan: All goals met       GO                Venita Sheffield Felicita Nuncio 05/14/2019, 11:08 AM  Pollyann Glen, M.A. [facility] Environmental education officer [phone] Office [phone]

## 2019-05-14 NOTE — Progress Notes (Signed)
PHARMACY - PHYSICIAN COMMUNICATION CRITICAL VALUE ALERT - BLOOD CULTURE IDENTIFICATION (BCID)  Martin Tanner is an 85 y.o. male who presented to [facility] on 05/12/2019 with a chief complaint of fever and AMS  Assessment:  85 yo M presents with fever and AMS. Diagnosed with COVID-19 in the past few days. Now 1/4 bottles showing GPC in clusters. BCID did not detect anything. Most likely contaminant.    Name of physician (or Provider) Contacted: S. Ghimire  Current antibiotics: None  Changes to prescribed antibiotics recommended:  No changes needed at this time Continue supportive care for COVID-19  PHARMACY - PHYSICIAN COMMUNICATION CRITICAL VALUE ALERT - BLOOD CULTURE IDENTIFICATION (BCID)  Results for orders placed or performed during the hospital encounter of 05/12/19  Blood Culture ID Panel (Reflexed) (Collected: 05/12/2019  8:40 PM)  Result Value Ref Range   Enterococcus species NOT DETECTED NOT DETECTED   Listeria monocytogenes NOT DETECTED NOT DETECTED   Staphylococcus species NOT DETECTED NOT DETECTED   Staphylococcus aureus (BCID) NOT DETECTED NOT DETECTED   Streptococcus species NOT DETECTED NOT DETECTED   Streptococcus agalactiae NOT DETECTED NOT DETECTED   Streptococcus pneumoniae NOT DETECTED NOT DETECTED   Streptococcus pyogenes NOT DETECTED NOT DETECTED   Acinetobacter baumannii NOT DETECTED NOT DETECTED   Enterobacteriaceae species NOT DETECTED NOT DETECTED   Enterobacter cloacae complex NOT DETECTED NOT DETECTED   Escherichia coli NOT DETECTED NOT DETECTED   Klebsiella oxytoca NOT DETECTED NOT DETECTED   Klebsiella pneumoniae NOT DETECTED NOT DETECTED   Proteus species NOT DETECTED NOT DETECTED   Serratia marcescens NOT DETECTED NOT DETECTED   Haemophilus influenzae NOT DETECTED NOT DETECTED   Neisseria meningitidis NOT DETECTED NOT DETECTED   Pseudomonas aeruginosa NOT DETECTED NOT DETECTED   Candida albicans NOT DETECTED NOT DETECTED   Candida glabrata  NOT DETECTED NOT DETECTED   Candida krusei NOT DETECTED NOT DETECTED   Candida parapsilosis NOT DETECTED NOT DETECTED   Candida tropicalis NOT DETECTED NOT DETECTED    [name] 05/14/2019  6:46 AM

## 2019-05-14 NOTE — Progress Notes (Signed)
Physical Therapy Treatment Patient Details Name: Martin Tanner MRN: [id number] DOB: [birth year] Today's Date: 05/14/2019    History of Present Illness 85 year old Nepalese speaking gentleman with advanced underlying dementia, chronic kidney disease stage III, chronic pancytopenia, anemia, history of UGI bleed in the past, BPH.  He lives with his son and family members in [place].  He was brought here for running fevers chest x-ray assessed of a viral pneumonia, CT head showed Chronic Hygromas he also tested positive for COVID-19 and he was admitted.    PT Comments    Interpreter used for commiunication, Patient apparantly with HOH. Patient did tell interpreter that he wanted to go home and be with his family. Patient assisted to partially stand x 2, did not ambulate. RN attempted communication with family without success. Continue PT efforts.   Follow Up Recommendations  (probably return home with family )     Equipment Recommendations  None recommended by PT    Recommendations for Other Services       Precautions / Restrictions Precautions Precaution Comments: incontinent    Mobility  Bed Mobility Overal bed mobility: Needs Assistance Bed Mobility: Rolling;Supine to Sit;Sit to Supine Rolling: Max assist;Mod assist   Supine to sit: Max assist     General bed mobility comments: even with interpreter, required extensive assistance and multimodal cues.,   Transfers Overall transfer level: Needs assistance Equipment used: 2 person hand held assist Transfers: Sit to/from Stand Sit to Stand: Max assist;+2 physical assistance;+2 safety/equipment         General transfer comment: Assistewd x 2 to stand at beside with max assist to power up. Patient did not attempt to ambu;late. returned to bed.  Ambulation/Gait             General Gait Details: unable   Stairs             Wheelchair Mobility    Modified Rankin (Stroke Patients Only)        Balance Overall balance assessment: Needs assistance Sitting-balance support: No upper extremity supported;Feet supported Sitting balance-Leahy Scale: Fair Sitting balance - Comments: patient demonstrated improved sitting balance x 5 minutes                                    Cognition Arousal/Alertness: Awake/alert Behavior During Therapy: Restless Overall Cognitive Status: No family/caregiver present to determine baseline cognitive functioning                                 General Comments: per interpreter,Prakish [id number] pt. stated he wants to go home and be with his family and he will not go outside. attempting but is unable to fully express except did understand patient to say that he did not know where he was or why      Exercises      General Comments        Pertinent Vitals/Pain Pain Assessment: No/denies pain Faces Pain Scale: No hurt    Home Living                      Prior Function            PT Goals (current goals can now be found in the care plan section) Progress towards PT goals: Progressing toward goals    Frequency    Min 3X/week  PT Plan Current plan remains appropriate    Co-evaluation              AM-PAC PT "6 Clicks" Mobility   Outcome Measure  Help needed turning from your back to your side while in a flat bed without using bedrails?: Total Help needed moving from lying on your back to sitting on the side of a flat bed without using bedrails?: Total Help needed moving to and from a bed to a chair (including a wheelchair)?: Total Help needed standing up from a chair using your arms (e.g., wheelchair or bedside chair)?: Total Help needed to walk in hospital room?: Total Help needed climbing 3-5 steps with a railing? : Total 6 Click Score: 6    End of Session Equipment Utilized During Treatment: Gait belt Activity Tolerance: Patient limited by fatigue;Other (comment)(language) Patient  left: in bed;with call bell/phone within reach;with nursing/sitter in room Nurse Communication: Mobility status PT Visit Diagnosis: Unsteadiness on feet (R26.81);Adult, failure to thrive (R62.7)     Time: [id number] PT Time Calculation (min) (ACUTE ONLY): 21 min  Charges:  $Therapeutic Activity: 8-22 mins                     Blanchard KelchKaren Eugine Bubb PT Acute Rehabilitation Services Pager [phone] Office [phone]    Rada HayHill, Imanni Burdine Elizabeth 05/14/2019, 2:46 PM

## 2019-05-15 DIAGNOSIS — H539 Unspecified visual disturbance: Secondary | ICD-10-CM | POA: Diagnosis not present

## 2019-05-15 DIAGNOSIS — N183 Chronic kidney disease, stage 3 (moderate): Secondary | ICD-10-CM

## 2019-05-15 LAB — CBC WITH DIFFERENTIAL/PLATELET
Abs Immature Granulocytes: 0.06 10*3/uL (ref 0.00–0.07)
Basophils Absolute: 0 10*3/uL (ref 0.0–0.1)
Basophils Relative: 0 %
Eosinophils Absolute: 0 10*3/uL (ref 0.0–0.5)
Eosinophils Relative: 0 %
HCT: 33.6 % — ABNORMAL LOW (ref 39.0–52.0)
Hemoglobin: 11.3 g/dL — ABNORMAL LOW (ref 13.0–17.0)
Immature Granulocytes: 1 %
Lymphocytes Relative: 5 %
Lymphs Abs: 0.3 10*3/uL — ABNORMAL LOW (ref 0.7–4.0)
MCH: 29.9 pg (ref 26.0–34.0)
MCHC: 33.6 g/dL (ref 30.0–36.0)
MCV: 88.9 fL (ref 80.0–100.0)
Monocytes Absolute: 0.1 10*3/uL (ref 0.1–1.0)
Monocytes Relative: 2 %
Neutro Abs: 6.4 10*3/uL (ref 1.7–7.7)
Neutrophils Relative %: 92 %
Platelets: 108 10*3/uL — ABNORMAL LOW (ref 150–400)
RBC: 3.78 MIL/uL — ABNORMAL LOW (ref 4.22–5.81)
RDW: 14.1 % (ref 11.5–15.5)
WBC: 6.9 10*3/uL (ref 4.0–10.5)
nRBC: 0 % (ref 0.0–0.2)

## 2019-05-15 LAB — CULTURE, BLOOD (ROUTINE X 2): Special Requests: ADEQUATE

## 2019-05-15 LAB — COMPREHENSIVE METABOLIC PANEL
ALT: 26 U/L (ref 0–44)
AST: 50 U/L — ABNORMAL HIGH (ref 15–41)
Albumin: 2.7 g/dL — ABNORMAL LOW (ref 3.5–5.0)
Alkaline Phosphatase: 50 U/L (ref 38–126)
Anion gap: 7 (ref 5–15)
BUN: 48 mg/dL — ABNORMAL HIGH (ref 8–23)
CO2: 24 mmol/L (ref 22–32)
Calcium: 8.5 mg/dL — ABNORMAL LOW (ref 8.9–10.3)
Chloride: 107 mmol/L (ref 98–111)
Creatinine, Ser: 1.43 mg/dL — ABNORMAL HIGH (ref 0.61–1.24)
GFR calc Af Amer: 50 mL/min — ABNORMAL LOW (ref >60)
GFR calc non Af Amer: 43 mL/min — ABNORMAL LOW (ref >60)
Glucose, Bld: 151 mg/dL — ABNORMAL HIGH (ref 70–99)
Potassium: 3.7 mmol/L (ref 3.5–5.1)
Sodium: 138 mmol/L (ref 135–145)
Total Bilirubin: 0.6 mg/dL (ref 0.3–1.2)
Total Protein: 7 g/dL (ref 6.5–8.1)

## 2019-05-15 LAB — MAGNESIUM: Magnesium: 2.3 mg/dL (ref 1.7–2.4)

## 2019-05-15 LAB — D-DIMER, QUANTITATIVE: D-Dimer, Quant: 3.37 ug/mL-FEU — ABNORMAL HIGH (ref 0.00–0.50)

## 2019-05-15 LAB — FERRITIN: Ferritin: 842 ng/mL — ABNORMAL HIGH (ref 24–336)

## 2019-05-15 LAB — C-REACTIVE PROTEIN: CRP: 7.4 mg/dL — ABNORMAL HIGH (ref <1.0)

## 2019-05-15 MED ORDER — METHYLPREDNISOLONE SODIUM SUCC 125 MG IJ SOLR
40.0000 mg | Freq: Two times a day (BID) | INTRAMUSCULAR | Status: DC
  Administered 2019-05-15: 40 mg via INTRAVENOUS
  Filled 2019-05-15: qty 2

## 2019-05-15 NOTE — Progress Notes (Signed)
Physical Therapy Treatment Patient Details Name: Martin Tanner HarbourBahadur Fleagle MRN: [id number] DOB: 02/07/1928 Today's Date: 05/15/2019    History of Present Illness 85 year old Nepalese speaking gentleman with advanced underlying dementia, chronic kidney disease stage III, chronic pancytopenia, anemia, history of UGI bleed in the past, BPH.  [place].  He was brought here for running fevers chest x-ray assessed of a viral pneumonia, CT head showed Chronic Hygromas he also tested positive for COVID-19 and he was admitted.    PT Comments    Pt was able to get up OOB to the chair, but was too restless to remain there safely without a working call bell system alarm.  He was overall min to mod assist with moments of max assist in sitting EOB.  We did work with teleinterpreter and he seemed to say more that made sense as the session went along (per interpreter).  He is very weak, but seemingly so PTA.  I will need to contact family to see what PLOF was and if he is near or far from his baseline.  He keeps repeating to the interpreter that he wants to go home.  VSS throughout.   Follow Up Recommendations  SNF;Other (comment)(pt voicing he wants to go home with his family)     Geophysical data processorquipment Recommendations  Wheelchair (measurements PT);Wheelchair cushion (measurements PT)    Recommendations for Other Services   NA     Precautions / Restrictions Precautions Precautions: Fall;Other (comment) Precaution Comments: incontinent    Mobility  Bed Mobility Overal bed mobility: Needs Assistance Bed Mobility: Supine to Sit;Sit to Supine     Supine to sit: Mod assist;HOB elevated Sit to supine: Mod assist;+2 for physical assistance;HOB elevated   General bed mobility comments: Mod assist to support trunk to come to sitting EOB.  Pt with difficulty initiating movement to command.  Two person mod assist to return to bed once seated.   Transfers Overall transfer level:  Needs assistance Equipment used: 2 person hand held assist Transfers: Sit to/from [employer] CorporationStand;Stand Pivot Transfers Sit to Stand: Min assist;+2 physical assistance Stand pivot transfers: +2 physical assistance;Mod assist       General transfer comment: Two person min to mod assist to stand and transfer to the recliner chair and back to the bed (once I realized the chair alarm did not work consistantly connected to the call bell system).  We used the steady the first transfer, but he did well, so we did hand held assist the second transfer to get back to bed.    Ambulation/Gait             General Gait Details: Not tested, but I think he could with two person hand held assist at least short distances.          Balance Overall balance assessment: Needs assistance Sitting-balance support: Feet supported;Bilateral upper extremity supported Sitting balance-Leahy Scale: Poor Sitting balance - Comments: At times he could support himself in sitting EOB with close supervision, but then he would throw back to supine or push back at times requiring max assist to maintain sitting balance EOB.    Standing balance support: Bilateral upper extremity supported Standing balance-Leahy Scale: Poor Standing balance comment: needs external support in standing.                             Cognition Arousal/Alertness: Awake/alert Behavior During Therapy: Restless;Impulsive Overall Cognitive Status: Impaired/Different from  baseline Area of Impairment: Safety/judgement;Awareness                         Safety/Judgement: Decreased awareness of safety;Decreased awareness of deficits Awareness: Intellectual   General Comments: per interpreter,he wants to go home and kept repeating that over and over.  He at times was non sensical or difficult to understand.              Pertinent Vitals/Pain Pain Assessment: Faces Pain Score: 0-No pain           PT Goals (current goals  can now be found in the care plan section) Acute Rehab PT Goals Patient Stated Goal: none stated Progress towards PT goals: Progressing toward goals    Frequency    Min 3X/week      PT Plan Current plan remains appropriate       AM-PAC PT "6 Clicks" Mobility   Outcome Measure  Help needed turning from your back to your side while in a flat bed without using bedrails?: A Lot Help needed moving from lying on your back to sitting on the side of a flat bed without using bedrails?: A Lot Help needed moving to and from a bed to a chair (including a wheelchair)?: A Lot Help needed standing up from a chair using your arms (e.g., wheelchair or bedside chair)?: A Little Help needed to walk in hospital room?: A Lot Help needed climbing 3-5 steps with a railing? : Total 6 Click Score: 12    End of Session Equipment Utilized During Treatment: Gait belt Activity Tolerance: Patient limited by fatigue Patient left: in bed;with call bell/phone within reach;with bed alarm set Nurse Communication: Mobility status PT Visit Diagnosis: Unsteadiness on feet (R26.81);Adult, failure to thrive (R62.7)     Time:  -  1410-3013    Charges:  $Therapeutic Activity: 38-52 mins          Zeth Buday B. Lord Lancour, PT, DPT  Acute Rehabilitation [phone] pager #[phone] office             05/15/2019, 5:09 PM

## 2019-05-15 NOTE — Progress Notes (Signed)
PROGRESS NOTE                                                                                                                                                                                                             Patient Demographics:    Martin Tanner, is a 85 y.o. male, DOB - 11/19/1928, WUJ:[id number]  Outpatient Primary MD for the patient is System, Pcp Not In    LOS - 3  Chief Complaint  Patient presents with  . Weakness  . Urinary Incontinence       Brief Narrative: Patient is a 85 y.o. male with PMHx of dementia, CKD stage III, presented with fever and altered mental status-found to have COVID-19 pneumonia.  See below for further details   Subjective:    Martin Tanner today was sleeping when I walked in-he awoke easily-he is still confused.  Is still confused.  Oral intake slowly improving-he appears to be profoundly weak.   Assessment  & Plan :   Pneumonia secondary to COVID 19: Slowly improving-thankfully he is not requiring any oxygen-inflammatory markers are trending down.  He is profoundly weak and very frail-has failure to thrive syndrome and is at potential to deteriorate further.  Start tapering steroids (started on 5/20) down-suspect requires a short course of 3 to 4 days.  Patient is not a good candidate for aggressive care-if he does deteriorate-may be best served by comfort measures.  Patient is a DNR, I again spoke to patient's grandson over the phone-family is aware of tenuous clinical situation-they are hopeful of eventual recovery.  They do realize that if patient deteriorates-he is not a candidate for any further escalation in care, and we may have no option but to pursue comfort measures.  Coagulase-negative bacteremia: Likely a contaminant-and not a infection.  Only 1/2 bottles positive.  No further antimicrobial therapy is required at this point  Severe debility/deconditioning: Very frail and weak-major  barrier to discharge.  Needs PT/OT.  Family aware that patient may never recover to his prior level of functioning.  Suspect will require SNF on discharge.  However family still not decided whether to pursue SNF or home health services on discharge, have spoken with the grandson today-have encouraged him to start talking with other family members that are involved in the patient's care to plan for eventual disposition home.  Dysphagia:  Suspect dysphagia secondary to dementia-probably worsened by severe deconditioning/frailty.  Speech therapy following-continue with dysphagia 1 diet.  Pancytopenia: Appears to be chronic-probably worsened due to COVID-19.  Continue to follow  CKD stage III: Creatinine appears to be fluctuating at times but nevertheless still close to usual baseline.  Follow periodically  Hypertension: Controlled with Coreg  Dementia: Pleasantly confused-at risk for delirium.  COVID-19 Labs:  Recent Labs    05/13/19 0525 05/13/19 0527 05/13/19 0528 05/14/19 8119 05/14/19 0619 05/15/19 0621 05/15/19 0622  DDIMER  --  4.09*  --  3.28*  --   --  3.37*  FERRITIN  --   --  1,834*  --  1,366* 842*  --   CRP 15.1*  --   --   --  14.7* 7.4*  --     Lab Results  Component Value Date   SARSCOV2NAA POSITIVE (A) 05/08/2019     COVID-19 medications: 5/20>>Solumedrol    ABG: No results found for: PHART, PCO2ART, PO2ART, HCO3, TCO2, ACIDBASEDEF, O2SAT  Condition - gaurded  Family Communication  : Grandson over the phone on 5/22  Code Status : DNR  Diet : Dysphagia 1 diet  Disposition Plan  :  Remain inpatient  Consults  :  None  Procedures  :   None  DVT Prophylaxis  :  Start Lovenox -watch platelets  Lab Results  Component Value Date   PLT 108 (L) 05/15/2019    Inpatient Medications  Scheduled Meds: . carvedilol  3.125 mg Oral BID WC  . enoxaparin (LOVENOX) injection  30 mg Subcutaneous Q24H  . feeding supplement (ENSURE ENLIVE)  237 mL Oral BID BM   . methylPREDNISolone (SOLU-MEDROL) injection  60 mg Intravenous Q8H  . pantoprazole  40 mg Oral Daily  . vitamin C  500 mg Oral Daily  . zinc sulfate  220 mg Oral Daily   Continuous Infusions: PRN Meds:.  Antibiotics  :    Anti-infectives (From admission, onward)   Start     Dose/Rate Route Frequency Ordered Stop   05/13/19 1800  cefTRIAXone (ROCEPHIN) 1 g in sodium chloride 0.9 % 100 mL IVPB  Status:  Discontinued     1 g 200 mL/hr over 30 Minutes Intravenous Every 24 hours 05/13/19 0232 05/13/19 1106   05/13/19 1800  azithromycin (ZITHROMAX) 500 mg in sodium chloride 0.9 % 250 mL IVPB  Status:  Discontinued     500 mg 250 mL/hr over 60 Minutes Intravenous Every 24 hours 05/13/19 0232 05/13/19 1106   05/12/19 2030  azithromycin (ZITHROMAX) 500 mg in sodium chloride 0.9 % 250 mL IVPB     500 mg 250 mL/hr over 60 Minutes Intravenous  Once 05/12/19 2015 05/12/19 2158   05/12/19 1730  cefTRIAXone (ROCEPHIN) 1 g in sodium chloride 0.9 % 100 mL IVPB     1 g 200 mL/hr over 30 Minutes Intravenous  Once 05/12/19 1723 05/12/19 1836       Time Spent in minutes 25    Jeoffrey Massed M.D on 05/15/2019 at 1:49 PM  To page go to [email or web address] - use universal password  [facility] -  Office  [phone]  See all Orders from today for further details   Admit date - 05/12/2019    3    Objective:   Vitals:   05/15/19 0009 05/15/19 0417 05/15/19 0956 05/15/19 1000  BP: 107/69  (!) 110/53 (!) 102/58  Pulse: 79  84 79  Resp: 18  20   Temp: 98.2 F (36.8  C) (!) 97.3 F (36.3 C)    TempSrc: Oral Axillary    SpO2: 91%  93% 93%  Weight:      Height:        Wt Readings from Last 3 Encounters:  05/12/19 45.4 kg  01/11/18 44.6 kg  11/18/17 43.5 kg     Intake/Output Summary (Last 24 hours) at 05/15/2019 1349 Last data filed at 05/15/2019 0418 Gross per 24 hour  Intake 0 ml  Output -  Net 0 ml     Physical Exam Gen Exam: Confused-awakes easily-chronically sick  appearing.  HEENT: Atraumatic normocephalic Chest: Clear to auscultation anteriorly-moving air well CVS: S1  S2 regular. Abdomen: Soft nontender nondistended extremities:no edema Neurology: He is nonfocal but has significant generalized weakness Skin: no rash   Data Review:    CBC Recent Labs  Lab 05/12/19 2040 05/13/19 0527 05/14/19 0614 05/15/19 0622  WBC 2.6* 3.1* 2.2* 6.9  HGB 11.1* 12.2* 12.6* 11.3*  HCT 33.3* 36.2* 37.0* 33.6*  PLT 80* 95* 93* 108*  MCV 91.0 89.4 89.8 88.9  MCH 30.3 30.1 30.6 29.9  MCHC 33.3 33.7 34.1 33.6  RDW 14.1 14.0 14.3 14.1  LYMPHSABS  --  0.5* 0.5* 0.3*  MONOABS  --  0.1 0.1 0.1  EOSABS  --  0.0 0.0 0.0  BASOSABS  --  0.0 0.0 0.0    Chemistries  Recent Labs  Lab 05/12/19 2040 05/13/19 0527 05/14/19 0614 05/15/19 0622  NA 139 140 140 138  K 3.7 3.6 4.0 3.7  CL 109 110 110 107  CO2 21* 19* 19* 24  GLUCOSE 102* 99 150* 151*  BUN 23 22 40* 48*  CREATININE 1.30* 1.34* 1.39* 1.43*  CALCIUM 7.6* 8.2* 8.6* 8.5*  MG  --   --  2.2 2.3  AST 67* 84* 80* 50*  ALT ALKPHOS 46 51 47 50  BILITOT 0.9 0.7 1.0 0.6   ------------------------------------------------------------------------------------------------------------------ Recent Labs    05/13/19 0527  TRIG 92    No results found for: HGBA1C ------------------------------------------------------------------------------------------------------------------ No results for input(s): TSH, T4TOTAL, T3FREE, THYROIDAB in the last 72 hours.  Invalid input(s): FREET3 ------------------------------------------------------------------------------------------------------------------ Recent Labs    05/14/19 0619 05/15/19 0621  FERRITIN 1,366* 842*    Coagulation profile No results for input(s): INR, PROTIME in the last 168 hours.  Recent Labs    05/14/19 0614 05/15/19 0622  DDIMER 3.28* 3.37*    Cardiac Enzymes Recent Labs  Lab 05/12/19 2040 05/13/19 0106 05/13/19  0528  TROPONINI 0.24* 0.26* 0.24*   ------------------------------------------------------------------------------------------------------------------ No results found for: BNP  Micro Results Recent Results (from the past 240 hour(s))  SARS Coronavirus 2 (CEPHEID - Performed in [facility] hospital lab), Hosp Order     Status: Abnormal   Collection Time: 05/08/19  1:10 PM  Result Value Ref Range Status   SARS Coronavirus 2 POSITIVE (A) NEGATIVE Final    Comment: RESULT CALLED TO, READ BACK BY AND VERIFIED WITH: Bedelia Person RN 14:30 05/08/19 ([email or web address]) (NOTE) If result is NEGATIVE SARS-CoV-2 target nucleic acids are NOT DETECTED. The SARS-CoV-2 RNA is generally detectable in upper and lower  respiratory specimens during the acute phase of infection. The lowest  concentration of SARS-CoV-2 viral copies this assay can detect is 250  copies / mL. A negative result does not preclude SARS-CoV-2 infection  and should not be used as the sole basis for treatment or other  patient management decisions.  A negative result may occur with  improper  specimen collection / handling, submission of specimen other  than nasopharyngeal swab, presence of viral mutation(s) within the  areas targeted by this assay, and inadequate number of viral copies  (<250 copies / mL). A negative result must be combined with clinical  observations, patient history, and epidemiological information. If result is POSITIVE SARS-CoV-2 target nucleic acids are DETECTED. T he SARS-CoV-2 RNA is generally detectable in upper and lower  respiratory specimens during the acute phase of infection.  Positive  results are indicative of active infection with SARS-CoV-2.  Clinical  correlation with patient history and other diagnostic information is  necessary to determine patient infection status.  Positive results do  not rule out bacterial infection or co-infection with other viruses. If result is PRESUMPTIVE POSTIVE SARS-CoV-2  nucleic acids MAY BE PRESENT.   A presumptive positive result was obtained on the submitted specimen  and confirmed on repeat testing.  While 2019 novel coronavirus  (SARS-CoV-2) nucleic acids may be present in the submitted sample  additional confirmatory testing may be necessary for epidemiological  and / or clinical management purposes  to differentiate between  SARS-CoV-2 and other Sarbecovirus currently known to infect humans.  If clinically indicated additional testing with an alternate test  methodology [phone]) is  advised. The SARS-CoV-2 RNA is generally  detectable in upper and lower respiratory specimens during the acute  phase of infection. The expected result is Negative. Fact Sheet for Patients:  BoilerBrush.com.cy Fact Sheet for Healthcare Providers: https://pope.com/ This test is not yet approved or cleared by the [place] FDA and has been authorized for detection and/or diagnosis of SARS-CoV-2 by FDA under an Emergency Use Authorization (EUA).  This EUA will remain in effect (meaning this test can be used) for the duration of the COVID-19 declaration under Section 564(b)(1) of the Act, 21 U.S.C. section 360bbb-3(b)(1), unless the authorization is terminated or revoked sooner. Performed at [facility], [address], [place], [place] [address]   Blood Culture (routine x 2)     Status: None   Collection Time: 05/08/19  1:15 PM  Result Value Ref Range Status   Specimen Description BLOOD LEFT ANTECUBITAL  Final   Special Requests   Final    BOTTLES DRAWN AEROBIC AND ANAEROBIC Blood Culture adequate volume   Culture   Final    NO GROWTH 5 DAYS Performed at [facility], [address] [place], [place] [address]    Report Status 05/13/2019 FINAL  Final  Blood Culture (routine x 2)     Status: None   Collection Time: 05/08/19  1:30 PM  Result Value Ref Range Status   Specimen Description BLOOD RIGHT  ANTECUBITAL  Final   Special Requests   Final    BOTTLES DRAWN AEROBIC AND ANAEROBIC Blood Culture adequate volume   Culture   Final    NO GROWTH 5 DAYS Performed at [facility], [address], [place], [place] [address]    Report Status 05/13/2019 FINAL  Final  Urine culture     Status: None   Collection Time: 05/08/19  4:51 PM  Result Value Ref Range Status   Specimen Description URINE, RANDOM  Final   Special Requests NONE  Final   Culture   Final    NO GROWTH Performed at [facility], [address], [place], [place] [address]    Report Status 05/09/2019 FINAL  Final  Culture, blood (Routine X 2) w Reflex to ID Panel  Status: Abnormal   Collection Time: 05/12/19  8:40 PM  Result Value Ref Range Status   Specimen Description BLOOD  Final   Special Requests   Final    BOTTLES DRAWN AEROBIC AND ANAEROBIC Blood Culture adequate volume   Culture  Setup Time   Final    GRAM POSITIVE COCCI IN CLUSTERS AEROBIC BOTTLE ONLY CRITICAL RESULT CALLED TO, READ BACK BY AND VERIFIED WITH: PHARMD N BATCHELDER [id number] AT 646AM BY CM    Culture (A)  Final    STAPHYLOCOCCUS SPECIES (COAGULASE NEGATIVE) THE SIGNIFICANCE OF ISOLATING THIS ORGANISM FROM A SINGLE SET OF BLOOD CULTURES WHEN MULTIPLE SETS ARE DRAWN IS UNCERTAIN. PLEASE NOTIFY THE MICROBIOLOGY DEPARTMENT WITHIN ONE WEEK IF SPECIATION AND SENSITIVITIES ARE REQUIRED.    Report Status 05/15/2019 FINAL  Final  Blood Culture ID Panel (Reflexed)     Status: None   Collection Time: 05/12/19  8:40 PM  Result Value Ref Range Status   Enterococcus species NOT DETECTED NOT DETECTED Final   Listeria monocytogenes NOT DETECTED NOT DETECTED Final   Staphylococcus species NOT DETECTED NOT DETECTED Final   Staphylococcus aureus (BCID) NOT DETECTED NOT DETECTED Final   Streptococcus species NOT DETECTED NOT DETECTED Final   Streptococcus agalactiae NOT DETECTED NOT DETECTED Final   Streptococcus pneumoniae NOT DETECTED NOT  DETECTED Final   Streptococcus pyogenes NOT DETECTED NOT DETECTED Final   Acinetobacter baumannii NOT DETECTED NOT DETECTED Final   Enterobacteriaceae species NOT DETECTED NOT DETECTED Final   Enterobacter cloacae complex NOT DETECTED NOT DETECTED Final   Escherichia coli NOT DETECTED NOT DETECTED Final   Klebsiella oxytoca NOT DETECTED NOT DETECTED Final   Klebsiella pneumoniae NOT DETECTED NOT DETECTED Final   Proteus species NOT DETECTED NOT DETECTED Final   Serratia marcescens NOT DETECTED NOT DETECTED Final   Haemophilus influenzae NOT DETECTED NOT DETECTED Final   Neisseria meningitidis NOT DETECTED NOT DETECTED Final   Pseudomonas aeruginosa NOT DETECTED NOT DETECTED Final   Candida albicans NOT DETECTED NOT DETECTED Final   Candida glabrata NOT DETECTED NOT DETECTED Final   Candida krusei NOT DETECTED NOT DETECTED Final   Candida parapsilosis NOT DETECTED NOT DETECTED Final   Candida tropicalis NOT DETECTED NOT DETECTED Final    Comment: Performed at [facility], [address] [place], [place] [address]  Culture, blood (Routine X 2) w Reflex to ID Panel     Status: None (Preliminary result)   Collection Time: 05/12/19  8:45 PM  Result Value Ref Range Status   Specimen Description   Final    BLOOD Performed at [facility], [address], [place], [place] [address]    Special Requests   Final    BOTTLES DRAWN AEROBIC AND ANAEROBIC Blood Culture adequate volume Performed at [facility], [address], [place], [place] [address]    Culture   Final    NO GROWTH 3 DAYS Performed at [facility], [address], [place], [place] [address]    Report Status PENDING  Incomplete  Culture, Urine     Status: None   Collection Time: 05/12/19 11:45 PM  Result Value Ref Range Status   Specimen Description   Final    URINE, CLEAN CATCH Performed at [facility], [address] [place], [place] [address]     Special Requests   Final    NONE Performed at [facility], [address] [place], [place] [address]  Culture   Final    NO GROWTH Performed at [facility], [address], [place], [place] [address]    Report Status 05/14/2019 FINAL  Final    Radiology Reports Ct Head Wo Contrast  Result Date: 05/12/2019 CLINICAL DATA:  85 year old male with failure to thrive, altered mental status. EXAM: CT HEAD WITHOUT CONTRAST TECHNIQUE: Contiguous axial images were obtained from the base of the skull through the vertex without intravenous contrast. COMPARISON:  Head CT 01/09/2018. FINDINGS: Brain: Study is intermittently degraded by motion artifact despite repeated imaging attempts. Fairly symmetric bilateral subdural hygromas or chronic hematomas have increased since 2019 most apparent on the coronal views where the subdural veins are more displaced from the inner table (coronal series 10, images 40 and 41). The collections measure 4-5 millimeters on the left and 3-4 millimeters on the right. There is no associated midline shift. Mild mass effect on both hemispheres. Basilar cisterns remain normal. No superimposed acute intracranial hemorrhage. Patchy and confluent bilateral cerebral white matter hypodensity and evidence of small chronic lacunar infarcts in the right basal ganglia and pons are stable. No ventriculomegaly. No cortically based acute infarct identified. Vascular: Calcified atherosclerosis at the skull base. No suspicious intracranial vascular hyperdensity. Skull: Mild motion artifact. No acute osseous abnormality identified. Sinuses/Orbits: Bubbly opacity in the bilateral maxillary sinuses and left sphenoid with small fluid levels. Chronic sinus mucoperiosteal thickening. The frontal sinuses today are clear. Tympanic cavities and mastoids are clear. Other: No acute orbit or scalp soft tissue findings. IMPRESSION: 1. 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 Mild associated mass effect on both hemispheres with no midline shift. 2. No other acute intracranial abnormality. Chronic small vessel disease is stable since 2019. 3. Positive also for acute on chronic paranasal sinusitis. Electronically Signed   By: Odessa Fleming M.D.   On: 05/12/2019 19:47   Dg Chest Port 1 View  Result Date: 05/13/2019 CLINICAL DATA:  Shortness of breath EXAM: PORTABLE CHEST 1 VIEW COMPARISON:  05/12/2019 FINDINGS: There is bilateral diffuse mild interstitial thickening likely chronic. There is increased airspace disease in the right lower lobe and periphery of the left mid lung concerning for superimposed pneumonia. No pleural effusion or pneumothorax. The heart mediastinum are stable. The osseous structures are unremarkable. IMPRESSION: Mild bilateral chronic interstitial lung disease. Increased airspace disease in the right lower lobe and periphery of the left mid lung concerning for superimposed pneumonia. Electronically Signed   By: Elige Ko   On: 05/13/2019 09:39   Dg Chest Port 1 View  Result Date: 05/12/2019 CLINICAL DATA:  Positive COVID-19.  Failure to thrive. EXAM: PORTABLE CHEST 1 VIEW COMPARISON:  May 08, 2019 FINDINGS: There is patchy opacity in the right base. The lungs elsewhere are clear. Heart is upper normal in size with pulmonary vascularity normal. No adenopathy. There is aortic atherosclerosis. No bone lesions. IMPRESSION: Patchy infiltrate consistent with pneumonia right base. Lungs elsewhere clear. Heart upper normal in size. No adenopathy evident. Aortic Atherosclerosis (ICD10-I70.0). Electronically Signed   By: Bretta Bang III M.D.   On: 05/12/2019 19:03   Dg Chest Port 1 View  Result Date: 05/08/2019 CLINICAL DATA:  Fevers EXAM: PORTABLE CHEST 1 VIEW COMPARISON:  01/09/2018 FINDINGS: Cardiac shadow is stable. Aortic calcifications are noted. The lungs are well aerated  bilaterally. No focal infiltrate is seen. Mild increased interstitial markings are noted stable from the prior exam. This may represent  mild edema. No bony abnormality is seen. IMPRESSION: Stable mild interstitial changes which may represent edema. Electronically Signed   By: Alcide Clever M.D.   On: 05/08/2019 14:44

## 2019-05-15 NOTE — Progress Notes (Signed)
attempted to reach family by phone to update and provide opurtunity for him to converse with them- was unsuccessful- tried to get him to drink ensure but he only took 1 sip and put it down to drink some water

## 2019-05-15 NOTE — Progress Notes (Signed)
Grandson and family able to [facility] with patient.  Family updated on patient condition and treatment plan.  No questions or concerns noted

## 2019-05-16 DIAGNOSIS — H539 Unspecified visual disturbance: Secondary | ICD-10-CM | POA: Diagnosis not present

## 2019-05-16 DIAGNOSIS — J1289 Other viral pneumonia: Secondary | ICD-10-CM

## 2019-05-16 LAB — CBC WITH DIFFERENTIAL/PLATELET
Abs Immature Granulocytes: 0.03 10*3/uL (ref 0.00–0.07)
Basophils Absolute: 0 10*3/uL (ref 0.0–0.1)
Basophils Relative: 0 %
Eosinophils Absolute: 0 10*3/uL (ref 0.0–0.5)
Eosinophils Relative: 0 %
HCT: 33.1 % — ABNORMAL LOW (ref 39.0–52.0)
Hemoglobin: 11.3 g/dL — ABNORMAL LOW (ref 13.0–17.0)
Immature Granulocytes: 1 %
Lymphocytes Relative: 5 %
Lymphs Abs: 0.3 10*3/uL — ABNORMAL LOW (ref 0.7–4.0)
MCH: 30.5 pg (ref 26.0–34.0)
MCHC: 34.1 g/dL (ref 30.0–36.0)
MCV: 89.5 fL (ref 80.0–100.0)
Monocytes Absolute: 0.1 10*3/uL (ref 0.1–1.0)
Monocytes Relative: 2 %
Neutro Abs: 4.9 10*3/uL (ref 1.7–7.7)
Neutrophils Relative %: 92 %
Platelets: 108 10*3/uL — ABNORMAL LOW (ref 150–400)
RBC: 3.7 MIL/uL — ABNORMAL LOW (ref 4.22–5.81)
RDW: 14.1 % (ref 11.5–15.5)
WBC: 5.3 10*3/uL (ref 4.0–10.5)
nRBC: 0 % (ref 0.0–0.2)

## 2019-05-16 LAB — COMPREHENSIVE METABOLIC PANEL
ALT: 23 U/L (ref 0–44)
AST: 38 U/L (ref 15–41)
Albumin: 2.6 g/dL — ABNORMAL LOW (ref 3.5–5.0)
Alkaline Phosphatase: 51 U/L (ref 38–126)
Anion gap: 11 (ref 5–15)
BUN: 47 mg/dL — ABNORMAL HIGH (ref 8–23)
CO2: 21 mmol/L — ABNORMAL LOW (ref 22–32)
Calcium: 8.5 mg/dL — ABNORMAL LOW (ref 8.9–10.3)
Chloride: 109 mmol/L (ref 98–111)
Creatinine, Ser: 1.28 mg/dL — ABNORMAL HIGH (ref 0.61–1.24)
GFR calc Af Amer: 57 mL/min — ABNORMAL LOW (ref >60)
GFR calc non Af Amer: 49 mL/min — ABNORMAL LOW (ref >60)
Glucose, Bld: 152 mg/dL — ABNORMAL HIGH (ref 70–99)
Potassium: 3.8 mmol/L (ref 3.5–5.1)
Sodium: 141 mmol/L (ref 135–145)
Total Bilirubin: 0.3 mg/dL (ref 0.3–1.2)
Total Protein: 6.7 g/dL (ref 6.5–8.1)

## 2019-05-16 LAB — C-REACTIVE PROTEIN: CRP: 5.1 mg/dL — ABNORMAL HIGH (ref <1.0)

## 2019-05-16 LAB — MAGNESIUM: Magnesium: 2.5 mg/dL — ABNORMAL HIGH (ref 1.7–2.4)

## 2019-05-16 LAB — D-DIMER, QUANTITATIVE: D-Dimer, Quant: 3 ug/mL-FEU — ABNORMAL HIGH (ref 0.00–0.50)

## 2019-05-16 LAB — FERRITIN: Ferritin: 629 ng/mL — ABNORMAL HIGH (ref 24–336)

## 2019-05-16 MED ORDER — METHYLPREDNISOLONE SODIUM SUCC 40 MG IJ SOLR
40.0000 mg | Freq: Two times a day (BID) | INTRAMUSCULAR | Status: DC
  Administered 2019-05-16 – 2019-05-21 (×11): 40 mg via INTRAVENOUS
  Filled 2019-05-16 (×11): qty 1

## 2019-05-16 NOTE — Progress Notes (Signed)
Patient spoke with grandson and granddaughter tonight.  Grandson very concerned that father is unable to communicate with [place] secondary to the language barrier.  Grandson stressed that staff could call at any time for him to help with translation.  We also showed patient how to push the nurse light on the call bell if he needed help.  Patient also only likes warm water.  Stressed to grandson that we are taking good care of his grandfather and that we would call him if we needed any help with translating.  Bernie Covey RN

## 2019-05-16 NOTE — Progress Notes (Signed)
PROGRESS NOTE                                                                                                                                                                                                             Patient Demographics:    Martin Tanner, is a 85 y.o. male, DOB - [birth year], ZOX:[id number]  Outpatient Primary MD for the patient is System, Pcp Not In    LOS - 4  Chief Complaint  Patient presents with  . Weakness  . Urinary Incontinence       Brief Narrative: Patient is a 85 y.o. male with PMHx of dementia, CKD stage III, presented with fever and altered mental status-found to have COVID-19 pneumonia.  See below for further details   Subjective:    [name] wakes up to voice. confused   Assessment  & Plan :   Pneumonia secondary to COVID 19: Slowly improving-thankfully he is not requiring any oxygen-inflammatory markers are trending down.  He is profoundly weak and very frail-has failure to thrive syndrome and is at potential to deteriorate further.  Start tapering steroids (started on 5/20) down-suspect requires a short course of 3 to 4 days.  Patient is not a good candidate for aggressive care-if he does deteriorate-may be best served by comfort measures.  Patient is a DNR, I again spoke to patient's grandson over the phone-family is aware of tenuous clinical situation-they are hopeful of eventual recovery.  They do realize that if patient deteriorates-he is not a candidate for any further escalation in care, and we may have no option but to pursue comfort measures.  Coagulase-negative bacteremia: Likely a contaminant-and not a infection.  Only 1/2 bottles positive.  No further antimicrobial therapy is required at this point  Severe debility/deconditioning: Very frail and weak-major barrier to discharge. Seen by PT with recommendations for SNF on discharge.  Family is agreeable. Will consult social work.   Dysphagia: Suspect dysphagia secondary to dementia-probably worsened by severe deconditioning/frailty.  Speech therapy following-continue with dysphagia 1 diet.  Pancytopenia: Appears to be chronic-probably worsened due to COVID-19.  Continue to follow  CKD stage III: Creatinine appears to be fluctuating at times but nevertheless still close to usual baseline.  Follow periodically  Hypertension: Controlled with Coreg  Dementia: Pleasantly confused-at risk for delirium.  COVID-19 Labs:  Recent Labs  05/14/19 5621 05/14/19 3086 05/15/19 0621 05/15/19 0622 05/16/19 0400  DDIMER 3.28*  --   --  3.37* 3.00*  FERRITIN  --  1,366* 842*  --  629*  CRP  --  14.7* 7.4*  --  5.1*    Lab Results  Component Value Date   SARSCOV2NAA POSITIVE (A) 05/08/2019     COVID-19 medications: 5/20>>Solumedrol    ABG: No results found for: PHART, PCO2ART, PO2ART, HCO3, TCO2, ACIDBASEDEF, O2SAT  Condition - gaurded  Family Communication  : Grandson over the phone on 5/23  Code Status : DNR  Diet : Dysphagia 1 diet  Disposition Plan  :  Remain inpatient, awaiting SNF placement  Consults  :  None  Procedures  :   None  DVT Prophylaxis  :  lovenox  Lab Results  Component Value Date   PLT 108 (L) 05/16/2019    Inpatient Medications  Scheduled Meds: . carvedilol  3.125 mg Oral BID WC  . enoxaparin (LOVENOX) injection  30 mg Subcutaneous Q24H  . feeding supplement (ENSURE ENLIVE)  237 mL Oral BID BM  . methylPREDNISolone (SOLU-MEDROL) injection  40 mg Intravenous Q12H  . pantoprazole  40 mg Oral Daily  . vitamin C  500 mg Oral Daily  . zinc sulfate  220 mg Oral Daily   Continuous Infusions: PRN Meds:.  Antibiotics  :    Anti-infectives (From admission, onward)   Start     Dose/Rate Route Frequency Ordered Stop   05/13/19 1800  cefTRIAXone (ROCEPHIN) 1 g in sodium chloride 0.9 % 100 mL IVPB  Status:  Discontinued     1 g 200 mL/hr over 30 Minutes Intravenous Every 24  hours 05/13/19 0232 05/13/19 1106   05/13/19 1800  azithromycin (ZITHROMAX) 500 mg in sodium chloride 0.9 % 250 mL IVPB  Status:  Discontinued     500 mg 250 mL/hr over 60 Minutes Intravenous Every 24 hours 05/13/19 0232 05/13/19 1106   05/12/19 2030  azithromycin (ZITHROMAX) 500 mg in sodium chloride 0.9 % 250 mL IVPB     500 mg 250 mL/hr over 60 Minutes Intravenous  Once 05/12/19 2015 05/12/19 2158   05/12/19 1730  cefTRIAXone (ROCEPHIN) 1 g in sodium chloride 0.9 % 100 mL IVPB     1 g 200 mL/hr over 30 Minutes Intravenous  Once 05/12/19 1723 05/12/19 1836       Time Spent in minutes 25    Erick Blinks M.D on 05/16/2019 at 2:21 PM  To page go to [email or web address] - use universal password  [facility] -  Office  [phone]  See all Orders from today for further details   Admit date - 05/12/2019    4    Objective:   Vitals:   05/15/19 2030 05/15/19 2332 05/16/19 0833 05/16/19 1407  BP:   122/73 108/90  Pulse:   68   Resp:      Temp: 98 F (36.7 C) (!) 97.5 F (36.4 C)  97.9 F (36.6 C)  TempSrc: Oral Oral  Oral  SpO2:    96%  Weight:      Height:        Wt Readings from Last 3 Encounters:  05/12/19 45.4 kg  01/11/18 44.6 kg  11/18/17 43.5 kg    No intake or output data in the 24 hours ending 05/16/19 1421   Physical Exam General exam: Alert, awake, no distress Respiratory system: Clear to auscultation. Respiratory effort normal. Cardiovascular system:RRR. No murmurs, rubs, gallops. Gastrointestinal system:  Abdomen is nondistended, soft and nontender. No organomegaly or masses felt. Normal bowel sounds heard. Central nervous system: No focal neurological deficits. Extremities: no edema bilaterally Skin: No rashes, lesions or ulcers Psychiatry: unable to assess due to confusion and language barrier     Data Review:    CBC Recent Labs  Lab 05/12/19 2040 05/13/19 0527 05/14/19 0614 05/15/19 0622 05/16/19 0400  WBC 2.6* 3.1* 2.2* 6.9 5.3   HGB 11.1* 12.2* 12.6* 11.3* 11.3*  HCT 33.3* 36.2* 37.0* 33.6* 33.1*  PLT 80* 95* 93* 108* 108*  MCV 91.0 89.4 89.8 88.9 89.5  MCH 30.3 30.1 30.6 29.9 30.5  MCHC 33.3 33.7 34.1 33.6 34.1  RDW 14.1 14.0 14.3 14.1 14.1  LYMPHSABS  --  0.5* 0.5* 0.3* 0.3*  MONOABS  --  0.1 0.1 0.1 0.1  EOSABS  --  0.0 0.0 0.0 0.0  BASOSABS  --  0.0 0.0 0.0 0.0    Chemistries  Recent Labs  Lab 05/12/19 2040 05/13/19 0527 05/14/19 0614 05/15/19 0622 05/16/19 0400  NA 139 140 140 138 141  K 3.7 3.6 4.0 3.7 3.8  CL 109 110 110 107 109  CO2 21* 19* 19* 24 21*  GLUCOSE 102* 99 150* 151* 152*  BUN 23 22 40* 48* 47*  CREATININE 1.30* 1.34* 1.39* 1.43* 1.28*  CALCIUM 7.6* 8.2* 8.6* 8.5* 8.5*  MG  --   --  2.2 2.3 2.5*  AST 67* 84* 80* 50* 38  ALT ALKPHOS 46 51 47 50 51  BILITOT 0.9 0.7 1.0 0.6 0.3   ------------------------------------------------------------------------------------------------------------------ No results for input(s): CHOL, HDL, LDLCALC, TRIG, CHOLHDL, LDLDIRECT in the last 72 hours.  No results found for: HGBA1C ------------------------------------------------------------------------------------------------------------------ No results for input(s): TSH, T4TOTAL, T3FREE, THYROIDAB in the last 72 hours.  Invalid input(s): FREET3 ------------------------------------------------------------------------------------------------------------------ Recent Labs    05/15/19 0621 05/16/19 0400  FERRITIN 842* 629*    Coagulation profile No results for input(s): INR, PROTIME in the last 168 hours.  Recent Labs    05/15/19 0622 05/16/19 0400  DDIMER 3.37* 3.00*    Cardiac Enzymes Recent Labs  Lab 05/12/19 2040 05/13/19 0106 05/13/19 0528  TROPONINI 0.24* 0.26* 0.24*   ------------------------------------------------------------------------------------------------------------------ No results found for: BNP  Micro Results Recent Results (from the past  240 hour(s))  SARS Coronavirus 2 (CEPHEID - Performed in [facility] hospital lab), Hosp Order     Status: Abnormal   Collection Time: 05/08/19  1:10 PM  Result Value Ref Range Status   SARS Coronavirus 2 POSITIVE (A) NEGATIVE Final    Comment: RESULT CALLED TO, READ BACK BY AND VERIFIED WITH: Bedelia Person RN 14:30 05/08/19 ([email or web address]) (NOTE) If result is NEGATIVE SARS-CoV-2 target nucleic acids are NOT DETECTED. The SARS-CoV-2 RNA is generally detectable in upper and lower  respiratory specimens during the acute phase of infection. The lowest  concentration of SARS-CoV-2 viral copies this assay can detect is 250  copies / mL. A negative result does not preclude SARS-CoV-2 infection  and should not be used as the sole basis for treatment or other  patient management decisions.  A negative result may occur with  improper specimen collection / handling, submission of specimen other  than nasopharyngeal swab, presence of viral mutation(s) within the  areas targeted by this assay, and inadequate number of viral copies  (<250 copies / mL). A negative result must be combined with clinical  observations, patient history, and epidemiological information. If result is POSITIVE SARS-CoV-2 target  nucleic acids are DETECTED. T he SARS-CoV-2 RNA is generally detectable in upper and lower  respiratory specimens during the acute phase of infection.  Positive  results are indicative of active infection with SARS-CoV-2.  Clinical  correlation with patient history and other diagnostic information is  necessary to determine patient infection status.  Positive results do  not rule out bacterial infection or co-infection with other viruses. If result is PRESUMPTIVE POSTIVE SARS-CoV-2 nucleic acids MAY BE PRESENT.   A presumptive positive result was obtained on the submitted specimen  and confirmed on repeat testing.  While 2019 novel coronavirus  (SARS-CoV-2) nucleic acids may be present in the submitted  sample  additional confirmatory testing may be necessary for epidemiological  and / or clinical management purposes  to differentiate between  SARS-CoV-2 and other Sarbecovirus currently known to infect humans.  If clinically indicated additional testing with an alternate test  methodology [phone]([id number]) is  advised. The SARS-CoV-2 RNA is generally  detectable in upper and lower respiratory specimens during the acute  phase of infection. The expected result is Negative. Fact Sheet for Patients:  BoilerBrush.com.cyhttps://www.fda.gov/media/136312/download Fact Sheet for Healthcare Providers: https://pope.com/https://www.fda.gov/media/136313/download This test is not yet approved or cleared by the [place] FDA and has been authorized for detection and/or diagnosis of SARS-CoV-2 by FDA under an Emergency Use Authorization (EUA).  This EUA will remain in effect (meaning this test can be used) for the duration of the COVID-19 declaration under Section 564(b)(1) of the Act, 21 U.S.C. section 360bbb-3(b)(1), unless the authorization is terminated or revoked sooner. Performed at [facility], [address] [place], [place] [address]   Blood Culture (routine x 2)     Status: None   Collection Time: 05/08/19  1:15 PM  Result Value Ref Range Status   Specimen Description BLOOD LEFT ANTECUBITAL  Final   Special Requests   Final    BOTTLES DRAWN AEROBIC AND ANAEROBIC Blood Culture adequate volume   Culture   Final    NO GROWTH 5 DAYS Performed at [facility], [address] [place], [place] [address]    Report Status 05/13/2019 FINAL  Final  Blood Culture (routine x 2)     Status: None   Collection Time: 05/08/19  1:30 PM  Result Value Ref Range Status   Specimen Description BLOOD RIGHT ANTECUBITAL  Final   Special Requests   Final    BOTTLES DRAWN AEROBIC AND ANAEROBIC Blood Culture adequate volume   Culture   Final    NO GROWTH 5 DAYS Performed at [facility], [address] [place], [place]  [address]    Report Status 05/13/2019 FINAL  Final  Urine culture     Status: None   Collection Time: 05/08/19  4:51 PM  Result Value Ref Range Status   Specimen Description URINE, RANDOM  Final   Special Requests NONE  Final   Culture   Final    NO GROWTH Performed at [facility], [address] [place], [place] [address]    Report Status 05/09/2019 FINAL  Final  Culture, blood (Routine X 2) w Reflex to ID Panel     Status: Abnormal   Collection Time: 05/12/19  8:40 PM  Result Value Ref Range Status   Specimen Description BLOOD  Final   Special Requests   Final    BOTTLES DRAWN AEROBIC AND ANAEROBIC Blood Culture adequate volume   Culture  Setup Time   Final  GRAM POSITIVE COCCI IN CLUSTERS AEROBIC BOTTLE ONLY CRITICAL RESULT CALLED TO, READ BACK BY AND VERIFIED WITH: PHARMD N BATCHELDER [id number] AT 646AM BY CM    Culture (A)  Final    STAPHYLOCOCCUS SPECIES (COAGULASE NEGATIVE) THE SIGNIFICANCE OF ISOLATING THIS ORGANISM FROM A SINGLE SET OF BLOOD CULTURES WHEN MULTIPLE SETS ARE DRAWN IS UNCERTAIN. PLEASE NOTIFY THE MICROBIOLOGY DEPARTMENT WITHIN ONE WEEK IF SPECIATION AND SENSITIVITIES ARE REQUIRED.    Report Status 05/15/2019 FINAL  Final  Blood Culture ID Panel (Reflexed)     Status: None   Collection Time: 05/12/19  8:40 PM  Result Value Ref Range Status   Enterococcus species NOT DETECTED NOT DETECTED Final   Listeria monocytogenes NOT DETECTED NOT DETECTED Final   Staphylococcus species NOT DETECTED NOT DETECTED Final   Staphylococcus aureus (BCID) NOT DETECTED NOT DETECTED Final   Streptococcus species NOT DETECTED NOT DETECTED Final   Streptococcus agalactiae NOT DETECTED NOT DETECTED Final   Streptococcus pneumoniae NOT DETECTED NOT DETECTED Final   Streptococcus pyogenes NOT DETECTED NOT DETECTED Final   Acinetobacter baumannii NOT DETECTED NOT DETECTED Final   Enterobacteriaceae species NOT DETECTED NOT DETECTED Final   Enterobacter cloacae complex NOT  DETECTED NOT DETECTED Final   Escherichia coli NOT DETECTED NOT DETECTED Final   Klebsiella oxytoca NOT DETECTED NOT DETECTED Final   Klebsiella pneumoniae NOT DETECTED NOT DETECTED Final   Proteus species NOT DETECTED NOT DETECTED Final   Serratia marcescens NOT DETECTED NOT DETECTED Final   Haemophilus influenzae NOT DETECTED NOT DETECTED Final   Neisseria meningitidis NOT DETECTED NOT DETECTED Final   Pseudomonas aeruginosa NOT DETECTED NOT DETECTED Final   Candida albicans NOT DETECTED NOT DETECTED Final   Candida glabrata NOT DETECTED NOT DETECTED Final   Candida krusei NOT DETECTED NOT DETECTED Final   Candida parapsilosis NOT DETECTED NOT DETECTED Final   Candida tropicalis NOT DETECTED NOT DETECTED Final    Comment: Performed at [facility], [address], [place], [place] [address]  Culture, blood (Routine X 2) w Reflex to ID Panel     Status: None (Preliminary result)   Collection Time: 05/12/19  8:45 PM  Result Value Ref Range Status   Specimen Description   Final    BLOOD Performed at [facility], [address], [place], [place] [address]    Special Requests   Final    BOTTLES DRAWN AEROBIC AND ANAEROBIC Blood Culture adequate volume Performed at [facility], [address], [place], [place] [address]    Culture   Final    NO GROWTH 4 DAYS Performed at [facility], [address] [place], [place] [address]    Report Status PENDING  Incomplete  Culture, Urine     Status: None   Collection Time: 05/12/19 11:45 PM  Result Value Ref Range Status   Specimen Description   Final    URINE, CLEAN CATCH Performed at [facility], [address] [place], [place] [address]    Special Requests   Final    NONE Performed at [facility], [address] [place], [place] [address]    Culture   Final    NO GROWTH Performed at [facility], [address], [place], [place]  [address]    Report Status 05/14/2019 FINAL  Final    Radiology Reports Ct Head Wo Contrast  Result Date: 05/12/2019 CLINICAL DATA:  86 year old male with failure to thrive,  altered mental status. EXAM: CT HEAD WITHOUT CONTRAST TECHNIQUE: Contiguous axial images were obtained from the base of the skull through the vertex without intravenous contrast. COMPARISON:  Head CT 01/09/2018. FINDINGS: Brain: Study is intermittently degraded by motion artifact despite repeated imaging attempts. Fairly symmetric bilateral subdural hygromas or chronic hematomas have increased since 2019 most apparent on the coronal views where the subdural veins are more displaced from the inner table (coronal series 10, images 40 and 41). The collections measure 4-5 millimeters on the left and 3-4 millimeters on the right. There is no associated midline shift. Mild mass effect on both hemispheres. Basilar cisterns remain normal. No superimposed acute intracranial hemorrhage. Patchy and confluent bilateral cerebral white matter hypodensity and evidence of small chronic lacunar infarcts in the right basal ganglia and pons are stable. No ventriculomegaly. No cortically based acute infarct identified. Vascular: Calcified atherosclerosis at the skull base. No suspicious intracranial vascular hyperdensity. Skull: Mild motion artifact. No acute osseous abnormality identified. Sinuses/Orbits: Bubbly opacity in the bilateral maxillary sinuses and left sphenoid with small fluid levels. Chronic sinus mucoperiosteal thickening. The frontal sinuses today are clear. Tympanic cavities and mastoids are clear. Other: No acute orbit or scalp soft tissue findings. IMPRESSION: 1. 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 Mild associated mass effect on both hemispheres with no midline shift. 2. No other acute intracranial abnormality. Chronic small vessel disease is  stable since 2019. 3. Positive also for acute on chronic paranasal sinusitis. Electronically Signed   By: Odessa Fleming M.D.   On: 05/12/2019 19:47   Dg Chest Port 1 View  Result Date: 05/13/2019 CLINICAL DATA:  Shortness of breath EXAM: PORTABLE CHEST 1 VIEW COMPARISON:  05/12/2019 FINDINGS: There is bilateral diffuse mild interstitial thickening likely chronic. There is increased airspace disease in the right lower lobe and periphery of the left mid lung concerning for superimposed pneumonia. No pleural effusion or pneumothorax. The heart mediastinum are stable. The osseous structures are unremarkable. IMPRESSION: Mild bilateral chronic interstitial lung disease. Increased airspace disease in the right lower lobe and periphery of the left mid lung concerning for superimposed pneumonia. Electronically Signed   By: Elige Ko   On: 05/13/2019 09:39   Dg Chest Port 1 View  Result Date: 05/12/2019 CLINICAL DATA:  Positive COVID-19.  Failure to thrive. EXAM: PORTABLE CHEST 1 VIEW COMPARISON:  May 08, 2019 FINDINGS: There is patchy opacity in the right base. The lungs elsewhere are clear. Heart is upper normal in size with pulmonary vascularity normal. No adenopathy. There is aortic atherosclerosis. No bone lesions. IMPRESSION: Patchy infiltrate consistent with pneumonia right base. Lungs elsewhere clear. Heart upper normal in size. No adenopathy evident. Aortic Atherosclerosis (ICD10-I70.0). Electronically Signed   By: Bretta Bang III M.D.   On: 05/12/2019 19:03   Dg Chest Port 1 View  Result Date: 05/08/2019 CLINICAL DATA:  Fevers EXAM: PORTABLE CHEST 1 VIEW COMPARISON:  01/09/2018 FINDINGS: Cardiac shadow is stable. Aortic calcifications are noted. The lungs are well aerated bilaterally. No focal infiltrate is seen. Mild increased interstitial markings are noted stable from the prior exam. This may represent mild edema. No bony abnormality is seen. IMPRESSION: Stable mild interstitial changes which may  represent edema. Electronically Signed   By: Alcide Clever M.D.   On: 05/08/2019 14:44

## 2019-05-16 NOTE — Progress Notes (Signed)
Called and updated patient's family. Family updated on patient's status and treatment plan. Family able to facetime with patient and states that patient is having trouble using the salt and pepper as well as the patient is vegetarian. Assisted patient with lunch and updated patients diet order to reflect vegetarian. No other questions or concerns at this time.

## 2019-05-17 DIAGNOSIS — H539 Unspecified visual disturbance: Secondary | ICD-10-CM | POA: Diagnosis not present

## 2019-05-17 LAB — CBC WITH DIFFERENTIAL/PLATELET
Abs Immature Granulocytes: 0.04 10*3/uL (ref 0.00–0.07)
Basophils Absolute: 0 10*3/uL (ref 0.0–0.1)
Basophils Relative: 0 %
Eosinophils Absolute: 0 10*3/uL (ref 0.0–0.5)
Eosinophils Relative: 0 %
HCT: 32 % — ABNORMAL LOW (ref 39.0–52.0)
Hemoglobin: 11.2 g/dL — ABNORMAL LOW (ref 13.0–17.0)
Immature Granulocytes: 1 %
Lymphocytes Relative: 7 %
Lymphs Abs: 0.3 10*3/uL — ABNORMAL LOW (ref 0.7–4.0)
MCH: 31.3 pg (ref 26.0–34.0)
MCHC: 35 g/dL (ref 30.0–36.0)
MCV: 89.4 fL (ref 80.0–100.0)
Monocytes Absolute: 0.1 10*3/uL (ref 0.1–1.0)
Monocytes Relative: 2 %
Neutro Abs: 4.2 10*3/uL (ref 1.7–7.7)
Neutrophils Relative %: 90 %
Platelets: 114 10*3/uL — ABNORMAL LOW (ref 150–400)
RBC: 3.58 MIL/uL — ABNORMAL LOW (ref 4.22–5.81)
RDW: 14 % (ref 11.5–15.5)
WBC: 4.6 10*3/uL (ref 4.0–10.5)
nRBC: 0 % (ref 0.0–0.2)

## 2019-05-17 LAB — CULTURE, BLOOD (ROUTINE X 2)
Culture: NO GROWTH
Special Requests: ADEQUATE

## 2019-05-17 LAB — COMPREHENSIVE METABOLIC PANEL
ALT: 23 U/L (ref 0–44)
AST: 32 U/L (ref 15–41)
Albumin: 2.4 g/dL — ABNORMAL LOW (ref 3.5–5.0)
Alkaline Phosphatase: 49 U/L (ref 38–126)
Anion gap: 10 (ref 5–15)
BUN: 41 mg/dL — ABNORMAL HIGH (ref 8–23)
CO2: 20 mmol/L — ABNORMAL LOW (ref 22–32)
Calcium: 8.4 mg/dL — ABNORMAL LOW (ref 8.9–10.3)
Chloride: 111 mmol/L (ref 98–111)
Creatinine, Ser: 1.1 mg/dL (ref 0.61–1.24)
GFR calc Af Amer: >60 mL/min (ref >60)
GFR calc non Af Amer: 59 mL/min — ABNORMAL LOW (ref >60)
Glucose, Bld: 185 mg/dL — ABNORMAL HIGH (ref 70–99)
Potassium: 3.3 mmol/L — ABNORMAL LOW (ref 3.5–5.1)
Sodium: 141 mmol/L (ref 135–145)
Total Bilirubin: 0.3 mg/dL (ref 0.3–1.2)
Total Protein: 6.3 g/dL — ABNORMAL LOW (ref 6.5–8.1)

## 2019-05-17 LAB — C-REACTIVE PROTEIN: CRP: 2.5 mg/dL — ABNORMAL HIGH (ref <1.0)

## 2019-05-17 LAB — D-DIMER, QUANTITATIVE: D-Dimer, Quant: 2.6 ug/mL-FEU — ABNORMAL HIGH (ref 0.00–0.50)

## 2019-05-17 LAB — FERRITIN: Ferritin: 471 ng/mL — ABNORMAL HIGH (ref 24–336)

## 2019-05-17 LAB — MAGNESIUM: Magnesium: 2.4 mg/dL (ref 1.7–2.4)

## 2019-05-17 MED ORDER — BOOST / RESOURCE BREEZE PO LIQD CUSTOM
1.0000 | Freq: Three times a day (TID) | ORAL | Status: DC
  Administered 2019-05-17 – 2019-05-18 (×6): 1 via ORAL
  Filled 2019-05-17 (×10): qty 1

## 2019-05-17 NOTE — [place] FL2 (Signed)
[place] MEDICAID FL2 LEVEL OF CARE SCREENING TOOL     IDENTIFICATION  Patient Name: Martin Tanner Birthdate: [birth year] Sex: male Admission Date (Current Location): 05/12/2019  [facility] and [place] Number:  Producer, television/film/video and Address:  [facility] [place], [address], [place], [place] [address]      Provider Number: [id number]  Attending Physician Name and Address:  Erick Blinks, MD  Relative Name and Phone Number:       Current Level of Care: Hospital Recommended Level of Care: Skilled Nursing Facility Prior Approval Number:    Date Approved/Denied:   PASRR Number:    Discharge Plan: SNF    Current Diagnoses: Patient Active Problem List   Diagnosis Date Noted  . Pneumonia due to severe acute respiratory syndrome coronavirus 2 (SARS-CoV-2) 05/12/2019  . Community acquired pneumonia 01/09/2018  . Nausea and vomiting 01/09/2018  . Pneumonia 01/09/2018  . Anemia due to chronic kidney disease 11/18/2017  . CKD (chronic kidney disease) 11/18/2017  . 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 Hematemesis 11/17/2017  . Hyponatremia 04/01/2013  . Pyelonephritis 01/28/2013  . Metabolic acidosis 01/28/2013  . Thrombocytopenia (HCC) 01/28/2013  . UTI (lower urinary tract infection) 01/25/2013  . Urinary retention 01/25/2013  . Anemia 01/25/2013  . Weakness generalized 01/25/2013    Orientation RESPIRATION BLADDER Height & Weight     Self  Normal Incontinent Weight: 100 lb (45.4 kg) Height:  5\' 3"  (160 cm)  BEHAVIORAL SYMPTOMS/MOOD NEUROLOGICAL BOWEL NUTRITION STATUS      Incontinent Diet(Dysphagia1 with Thin Liquids)  AMBULATORY STATUS COMMUNICATION OF NEEDS Skin   Extensive Assist Verbally(Non English Speaking) Normal                       Personal Care Assistance Level of Assistance  Bathing, Feeding, Dressing Bathing Assistance: Limited assistance Feeding assistance: Limited assistance Dressing Assistance: Limited assistance      Functional Limitations Info  Sight, Hearing, Speech Sight Info: Adequate Hearing Info: Adequate Speech Info: Adequate    SPECIAL CARE FACTORS FREQUENCY  PT (By licensed PT), OT (By licensed OT)     PT Frequency: 3-5 times a week OT Frequency: 3-5 times a week            Contractures Contractures Info: Not present    Additional Factors Info  Code Status, Allergies, Isolation Precautions Code Status Info: DNR Allergies Info: No Known Allergies     Isolation Precautions Info: COVID Positive     Current Medications (05/17/2019):  This is the current hospital active medication list Current Facility-Administered Medications  Medication Dose Route Frequency Provider Last Rate Last Dose  . albuterol (VENTOLIN HFA) 108 (90 Base) MCG/ACT inhaler 2 puff  2 puff Inhalation Q4H PRN Maretta Bees, MD      . carvedilol (COREG) tablet 3.125 mg  3.125 mg Oral BID WC Leroy Sea, MD   3.125 mg at 05/17/19 1624  . enoxaparin (LOVENOX) injection 30 mg  30 mg Subcutaneous Q24H Maretta Bees, MD   30 mg at 05/17/19 1625  . feeding supplement (BOOST / RESOURCE BREEZE) liquid 1 Container  1 Container Oral TID BM Erick Blinks, MD   1 Container at 05/17/19 2012  . methylPREDNISolone sodium succinate (SOLU-MEDROL) 40 mg/mL injection 40 mg  40 mg Intravenous Q12H Maretta Bees, MD   40 mg at 05/17/19 1745  . ondansetron (ZOFRAN) injection 4 mg  4 mg Intravenous Q6H PRN Ghimire, Werner Lean, MD      .  pantoprazole (PROTONIX) EC tablet 40 mg  40 mg Oral Daily Leroy SeaSingh, Prashant K, MD   40 mg at 05/17/19 0944  . vitamin C (ASCORBIC ACID) tablet 500 mg  500 mg Oral Daily Maretta BeesGhimire, Shanker M, MD   500 mg at 05/17/19 0944  . zinc sulfate capsule 220 mg  220 mg Oral Daily Maretta BeesGhimire, Shanker M, MD   220 mg at 05/17/19 [id number]     Discharge Medications: Please see discharge summary for a list of discharge medications.  Relevant Imaging Results:  Relevant Lab Results:   Additional  Information SSN [id number]  Quinley Nesler, Joyice FasterJessica Lynn, LCSW

## 2019-05-17 NOTE — TOC Initial Note (Signed)
Transition of Care [facility](TOC) - Initial/Assessment Note    Patient Details  Name: Martin Tanner On MRN: [id number] Date of Birth: 12/12/1928  Transition of Care [facility](TOC) CM/SW Contact:    Martin Tanner, Martin FasterJessica Lynn, LCSW Phone Number: 05/17/2019, 10:28 PM  Clinical Narrative:                  Clinical Social Worker spoke with patient grandson over the phone to offer support and discuss patient needs at discharge.  Therapies are recommending SNF placement, however patient grandson prefers patient to return home.  Patient grandson does state that as long as patient remains positive COVID he does not have the ability to self isolate at home with only one bathroom.  CSW to complete FL2 and initiate bed search for Medicaid only positive [place] SN[place].  CSW will continue to communicate with grandson and try to develop a successful plan for home with family.  Patient grandson expressed concerns about patient dementia and inability to video chat while at the facility.  Patient with special diet accomodations and confusion can at times be a barrier to care.  Patient with limited ability to understand [place].  CSW remains available for support and to facilitate patient discharge needs once available.  Expected Discharge Plan: Skilled Nursing Facility Barriers to Discharge: Barriers Unresolved (comment)(COVID positive Medicaid only)   Patient Goals and CMS Choice Patient states their goals for this hospitalization and ongoing recovery are:: Patient grandson wants patient to return home but patient must be negative prior to return      Expected Discharge Plan and Services Expected Discharge Plan: Skilled Nursing Facility In-house Referral: NA Discharge Planning Services: CM Consult Post Acute Care Choice: Skilled Nursing Facility Living arrangements for the past 2 months: Single Family Home                                      Prior Living Arrangements/Services Living  arrangements for the past 2 months: Single Family Home Lives with:: Relatives Patient language and need for interpreter reviewed:: Yes Do you feel safe going back to the place where you live?: Yes      Need for Family Participation in Patient Care: Yes (Comment) Care giver support system in place?: Yes (comment)   Criminal Activity/Legal Involvement Pertinent to Current Situation/Hospitalization: No - Comment as needed  Activities of Daily Living      Permission Sought/Granted Permission sought to share information with : Family Supports    Share Information with NAME: [name] Llera     Permission granted to share info w Relationship: Grandson  Permission granted to share info w Contact Information: [phone]  Emotional Assessment Appearance:: Appears stated age Attitude/Demeanor/Rapport: Unable to Assess Affect (typically observed): Unable to Assess Orientation: : Oriented to Self Alcohol / Substance Use: Not Applicable Psych Involvement: No (comment)  Admission diagnosis:  Elevated troponin [R79.89] Fever [R50.9] Subdural hygroma [G96.0] Community acquired pneumonia, unspecified laterality [J18.9] 2019 novel coronavirus disease (COVID-19) [U07.1] Patient Active Problem List   Diagnosis Date Noted  . Pneumonia due to severe acute respiratory syndrome coronavirus 2 (SARS-CoV-2) 05/12/2019  . Community acquired pneumonia 01/09/2018  . Nausea and vomiting 01/09/2018  . Pneumonia 01/09/2018  . Anemia due to chronic kidney disease 11/18/2017  . CKD (chronic kidney disease) 11/18/2017  . 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 Hematemesis 11/17/2017  . Hyponatremia 04/01/2013  . Pyelonephritis 01/28/2013  . Metabolic acidosis  01/28/2013  . Thrombocytopenia (HCC) 01/28/2013  . UTI (lower urinary tract infection) 01/25/2013  . Urinary retention 01/25/2013  . Anemia 01/25/2013  . Weakness generalized 01/25/2013   PCP:  System, Pcp Not In Pharmacy:   [id number] [place], [place] - [address] AT [facility] [address] [place] [address] Phone: [phone] Fax: [phone]  [facility] [place], [place] - [address] [place] [address] Phone: [phone] Fax: [phone]  [id number] [place], [place] - [address] AT [facility] [address] & [facility] [address] [place] [address] Phone: [phone] Fax: [phone]     Social Determinants of Health (SDOH) Interventions    Readmission Risk Interventions No flowsheet data found.

## 2019-05-17 NOTE — Progress Notes (Signed)
PROGRESS NOTE                                                                                                                                                                                                             Patient Demographics:    Martin Tanner, is a 85 y.o. male, DOB - 1928-11-28, ZOX:[id number]  Outpatient Primary MD for the patient is System, Pcp Not In    LOS - 5  Chief Complaint  Patient presents with  . Weakness  . Urinary Incontinence       Brief Narrative: Patient is a 85 y.o. male with PMHx of dementia, CKD stage III, presented with fever and altered mental status-found to have COVID-19 pneumonia.  See below for further details   Subjective:    [name] no distress, confused. Very weak.   Assessment  & Plan :   Pneumonia secondary to COVID 19: Slowly improving-thankfully he is not requiring any oxygen-inflammatory markers are trending down.  He is profoundly weak and very frail-has failure to thrive syndrome and is at potential to deteriorate further.  Start tapering steroids (started on 5/20) down-suspect requires a short course of 3 to 4 days.  Patient is not a good candidate for aggressive care-if he does deteriorate-may be best served by comfort measures.  Patient is a DNR, I again spoke to patient's grandson over the phone-family is aware of tenuous clinical situation-they are hopeful of eventual recovery.  They do realize that if patient deteriorates-he is not a candidate for any further escalation in care, and we may have no option but to pursue comfort measures.  Coagulase-negative bacteremia: Likely a contaminant-and not a infection.  Only 1/2 bottles positive.  No further antimicrobial therapy is required at this point  Severe debility/deconditioning: Very frail and weak-major barrier to discharge. Seen by PT with recommendations for SNF on discharge.  Family is agreeable. Social work consulted   Dysphagia: Suspect dysphagia secondary to dementia-probably worsened by severe deconditioning/frailty.  Speech therapy following-continue with dysphagia 1 diet.  Pancytopenia: Appears to be chronic-probably worsened due to COVID-19.  Continue to follow  CKD stage III: Creatinine appears to be fluctuating at times but nevertheless still close to usual baseline.  Follow periodically  Hypertension: Controlled with Coreg  Dementia: Pleasantly confused-at risk for delirium.  COVID-19 Labs:  Recent Labs  05/15/19 0621 05/15/19 0622 05/16/19 0400 05/17/19 0532  DDIMER  --  3.37* 3.00* 2.60*  FERRITIN 842*  --  629* 471*  CRP 7.4*  --  5.1* 2.5*    Lab Results  Component Value Date   SARSCOV2NAA POSITIVE (A) 05/08/2019     COVID-19 medications: 5/20>>Solumedrol    ABG: No results found for: PHART, PCO2ART, PO2ART, HCO3, TCO2, ACIDBASEDEF, O2SAT  Condition - gaurded  Family Communication  : Grandson over the phone on 5/24  Code Status : DNR  Diet : Dysphagia 1 diet  Disposition Plan  :  Remain inpatient, awaiting SNF placement  Consults  :  None  Procedures  :   None  DVT Prophylaxis  :  lovenox  Lab Results  Component Value Date   PLT 114 (L) 05/17/2019    Inpatient Medications  Scheduled Meds: . carvedilol  3.125 mg Oral BID WC  . enoxaparin (LOVENOX) injection  30 mg Subcutaneous Q24H  . feeding supplement  1 Container Oral TID BM  . methylPREDNISolone (SOLU-MEDROL) injection  40 mg Intravenous Q12H  . pantoprazole  40 mg Oral Daily  . vitamin C  500 mg Oral Daily  . zinc sulfate  220 mg Oral Daily   Continuous Infusions: PRN Meds:.  Antibiotics  :    Anti-infectives (From admission, onward)   Start     Dose/Rate Route Frequency Ordered Stop   05/13/19 1800  cefTRIAXone (ROCEPHIN) 1 g in sodium chloride 0.9 % 100 mL IVPB  Status:  Discontinued     1 g 200 mL/hr over 30 Minutes Intravenous Every 24 hours 05/13/19 0232 05/13/19 1106   05/13/19  1800  azithromycin (ZITHROMAX) 500 mg in sodium chloride 0.9 % 250 mL IVPB  Status:  Discontinued     500 mg 250 mL/hr over 60 Minutes Intravenous Every 24 hours 05/13/19 0232 05/13/19 1106   05/12/19 2030  azithromycin (ZITHROMAX) 500 mg in sodium chloride 0.9 % 250 mL IVPB     500 mg 250 mL/hr over 60 Minutes Intravenous  Once 05/12/19 2015 05/12/19 2158   05/12/19 1730  cefTRIAXone (ROCEPHIN) 1 g in sodium chloride 0.9 % 100 mL IVPB     1 g 200 mL/hr over 30 Minutes Intravenous  Once 05/12/19 1723 05/12/19 1836       Time Spent in minutes 25    Erick Blinks M.D on 05/17/2019 at 4:37 PM  To page go to [email or web address] - use universal password  [facility] -  Office  [phone]  See all Orders from today for further details   Admit date - 05/12/2019    5    Objective:   Vitals:   05/17/19 0745 05/17/19 1158 05/17/19 1624 05/17/19 1625  BP: 139/81 123/78 124/77 124/77  Pulse: 61 68 65 66  Resp: Temp: 97.7 F (36.5 C) (!) 97 F (36.1 C)  (!) 96.5 F (35.8 C)  TempSrc: Oral Axillary  Axillary  SpO2: 94% 94%  94%  Weight:      Height:        Wt Readings from Last 3 Encounters:  05/12/19 45.4 kg  01/11/18 44.6 kg  11/18/17 43.5 kg     Intake/Output Summary (Last 24 hours) at 05/17/2019 1637 Last data filed at 05/17/2019 0600 Gross per 24 hour  Intake 120 ml  Output 750 ml  Net -630 ml     Physical Exam General exam: Alert, awake, no distress Respiratory system: Clear to auscultation. Respiratory effort  normal. Cardiovascular system:RRR. No murmurs, rubs, gallops. Gastrointestinal system: Abdomen is nondistended, soft and nontender. No organomegaly or masses felt. Normal bowel sounds heard. Central nervous system: No focal neurological deficits. Extremities: no edema bilaterally Skin: No rashes, lesions or ulcers Psychiatry: unable to assess due to confusion and language barrier     Data Review:    CBC Recent Labs  Lab 05/13/19  0527 05/14/19 0614 05/15/19 0622 05/16/19 0400 05/17/19 0532  WBC 3.1* 2.2* 6.9 5.3 4.6  HGB 12.2* 12.6* 11.3* 11.3* 11.2*  HCT 36.2* 37.0* 33.6* 33.1* 32.0*  PLT 95* 93* 108* 108* 114*  MCV 89.4 89.8 88.9 89.5 89.4  MCH 30.1 30.6 29.9 30.5 31.3  MCHC 33.7 34.1 33.6 34.1 35.0  RDW 14.0 14.3 14.1 14.1 14.0  LYMPHSABS 0.5* 0.5* 0.3* 0.3* 0.3*  MONOABS 0.1 0.1 0.1 0.1 0.1  EOSABS 0.0 0.0 0.0 0.0 0.0  BASOSABS 0.0 0.0 0.0 0.0 0.0    Chemistries  Recent Labs  Lab 05/13/19 0527 05/14/19 0614 05/15/19 0622 05/16/19 0400 05/17/19 0532  NA 140 140 138 141 141  K 3.6 4.0 3.7 3.8 3.3*  CL 110 110 107 109 111  CO2 19* 19* 24 21* 20*  GLUCOSE 99 150* 151* 152* 185*  BUN 22 40* 48* 47* 41*  CREATININE 1.34* 1.39* 1.43* 1.28* 1.10  CALCIUM 8.2* 8.6* 8.5* 8.5* 8.4*  MG  --  2.2 2.3 2.5* 2.4  AST 84* 80* 50* 38 32  ALT ALKPHOS 51 47 50 51 49  BILITOT 0.7 1.0 0.6 0.3 0.3   ------------------------------------------------------------------------------------------------------------------ No results for input(s): CHOL, HDL, LDLCALC, TRIG, CHOLHDL, LDLDIRECT in the last 72 hours.  No results found for: HGBA1C ------------------------------------------------------------------------------------------------------------------ No results for input(s): TSH, T4TOTAL, T3FREE, THYROIDAB in the last 72 hours.  Invalid input(s): FREET3 ------------------------------------------------------------------------------------------------------------------ Recent Labs    05/16/19 0400 05/17/19 0532  FERRITIN 629* 471*    Coagulation profile No results for input(s): INR, PROTIME in the last 168 hours.  Recent Labs    05/16/19 0400 05/17/19 0532  DDIMER 3.00* 2.60*    Cardiac Enzymes Recent Labs  Lab 05/12/19 2040 05/13/19 0106 05/13/19 0528  TROPONINI 0.24* 0.26* 0.24*    ------------------------------------------------------------------------------------------------------------------ No results found for: BNP  Micro Results Recent Results (from the past 240 hour(s))  SARS Coronavirus 2 (CEPHEID - Performed in [facility] hospital lab), Hosp Order     Status: Abnormal   Collection Time: 05/08/19  1:10 PM  Result Value Ref Range Status   SARS Coronavirus 2 POSITIVE (A) NEGATIVE Final    Comment: RESULT CALLED TO, READ BACK BY AND VERIFIED WITH: Bedelia Person RN 14:30 05/08/19 ([email or web address]) (NOTE) If result is NEGATIVE SARS-CoV-2 target nucleic acids are NOT DETECTED. The SARS-CoV-2 RNA is generally detectable in upper and lower  respiratory specimens during the acute phase of infection. The lowest  concentration of SARS-CoV-2 viral copies this assay can detect is 250  copies / mL. A negative result does not preclude SARS-CoV-2 infection  and should not be used as the sole basis for treatment or other  patient management decisions.  A negative result may occur with  improper specimen collection / handling, submission of specimen other  than nasopharyngeal swab, presence of viral mutation(s) within the  areas targeted by this assay, and inadequate number of viral copies  (<250 copies / mL). A negative result must be combined with clinical  observations, patient history, and epidemiological information. If result is POSITIVE SARS-CoV-2 target nucleic  acids are DETECTED. T he SARS-CoV-2 RNA is generally detectable in upper and lower  respiratory specimens during the acute phase of infection.  Positive  results are indicative of active infection with SARS-CoV-2.  Clinical  correlation with patient history and other diagnostic information is  necessary to determine patient infection status.  Positive results do  not rule out bacterial infection or co-infection with other viruses. If result is PRESUMPTIVE POSTIVE SARS-CoV-2 nucleic acids MAY BE PRESENT.   A  presumptive positive result was obtained on the submitted specimen  and confirmed on repeat testing.  While 2019 novel coronavirus  (SARS-CoV-2) nucleic acids may be present in the submitted sample  additional confirmatory testing may be necessary for epidemiological  and / or clinical management purposes  to differentiate between  SARS-CoV-2 and other Sarbecovirus currently known to infect humans.  If clinically indicated additional testing with an alternate test  methodology [phone]) is  advised. The SARS-CoV-2 RNA is generally  detectable in upper and lower respiratory specimens during the acute  phase of infection. The expected result is Negative. Fact Sheet for Patients:  BoilerBrush.com.cy Fact Sheet for Healthcare Providers: https://pope.com/ This test is not yet approved or cleared by the [place] FDA and has been authorized for detection and/or diagnosis of SARS-CoV-2 by FDA under an Emergency Use Authorization (EUA).  This EUA will remain in effect (meaning this test can be used) for the duration of the COVID-19 declaration under Section 564(b)(1) of the Act, 21 U.S.C. section 360bbb-3(b)(1), unless the authorization is terminated or revoked sooner. Performed at [facility], [address] [place], [place] [address]   Blood Culture (routine x 2)     Status: None   Collection Time: 05/08/19  1:15 PM  Result Value Ref Range Status   Specimen Description BLOOD LEFT ANTECUBITAL  Final   Special Requests   Final    BOTTLES DRAWN AEROBIC AND ANAEROBIC Blood Culture adequate volume   Culture   Final    NO GROWTH 5 DAYS Performed at [facility], [address] [place], [place] [address]    Report Status 05/13/2019 FINAL  Final  Blood Culture (routine x 2)     Status: None   Collection Time: 05/08/19  1:30 PM  Result Value Ref Range Status   Specimen Description BLOOD RIGHT ANTECUBITAL  Final   Special  Requests   Final    BOTTLES DRAWN AEROBIC AND ANAEROBIC Blood Culture adequate volume   Culture   Final    NO GROWTH 5 DAYS Performed at [facility], [address], [place], [place] [address]    Report Status 05/13/2019 FINAL  Final  Urine culture     Status: None   Collection Time: 05/08/19  4:51 PM  Result Value Ref Range Status   Specimen Description URINE, RANDOM  Final   Special Requests NONE  Final   Culture   Final    NO GROWTH Performed at [facility], [name] [place], [place] [address]    Report Status 05/09/2019 FINAL  Final  Culture, blood (Routine X 2) w Reflex to ID Panel     Status: Abnormal   Collection Time: 05/12/19  8:40 PM  Result Value Ref Range Status   Specimen Description BLOOD  Final   Special Requests   Final    BOTTLES DRAWN AEROBIC AND ANAEROBIC Blood Culture adequate volume   Culture  Setup Time   Final    GRAM  POSITIVE COCCI IN CLUSTERS AEROBIC BOTTLE ONLY CRITICAL RESULT CALLED TO, READ BACK BY AND VERIFIED WITH: PHARMD N BATCHELDER [id number] AT 646AM BY CM    Culture (A)  Final    STAPHYLOCOCCUS SPECIES (COAGULASE NEGATIVE) THE SIGNIFICANCE OF ISOLATING THIS ORGANISM FROM A SINGLE SET OF BLOOD CULTURES WHEN MULTIPLE SETS ARE DRAWN IS UNCERTAIN. PLEASE NOTIFY THE MICROBIOLOGY DEPARTMENT WITHIN ONE WEEK IF SPECIATION AND SENSITIVITIES ARE REQUIRED.    Report Status 05/15/2019 FINAL  Final  Blood Culture ID Panel (Reflexed)     Status: None   Collection Time: 05/12/19  8:40 PM  Result Value Ref Range Status   Enterococcus species NOT DETECTED NOT DETECTED Final   Listeria monocytogenes NOT DETECTED NOT DETECTED Final   Staphylococcus species NOT DETECTED NOT DETECTED Final   Staphylococcus aureus (BCID) NOT DETECTED NOT DETECTED Final   Streptococcus species NOT DETECTED NOT DETECTED Final   Streptococcus agalactiae NOT DETECTED NOT DETECTED Final   Streptococcus pneumoniae NOT DETECTED NOT DETECTED Final   Streptococcus  pyogenes NOT DETECTED NOT DETECTED Final   Acinetobacter baumannii NOT DETECTED NOT DETECTED Final   Enterobacteriaceae species NOT DETECTED NOT DETECTED Final   Enterobacter cloacae complex NOT DETECTED NOT DETECTED Final   Escherichia coli NOT DETECTED NOT DETECTED Final   Klebsiella oxytoca NOT DETECTED NOT DETECTED Final   Klebsiella pneumoniae NOT DETECTED NOT DETECTED Final   Proteus species NOT DETECTED NOT DETECTED Final   Serratia marcescens NOT DETECTED NOT DETECTED Final   Haemophilus influenzae NOT DETECTED NOT DETECTED Final   Neisseria meningitidis NOT DETECTED NOT DETECTED Final   Pseudomonas aeruginosa NOT DETECTED NOT DETECTED Final   Candida albicans NOT DETECTED NOT DETECTED Final   Candida glabrata NOT DETECTED NOT DETECTED Final   Candida krusei NOT DETECTED NOT DETECTED Final   Candida parapsilosis NOT DETECTED NOT DETECTED Final   Candida tropicalis NOT DETECTED NOT DETECTED Final    Comment: Performed at [facility], [address], [place], [place] [address]  Culture, blood (Routine X 2) w Reflex to ID Panel     Status: None   Collection Time: 05/12/19  8:45 PM  Result Value Ref Range Status   Specimen Description   Final    BLOOD Performed at [facility], [address] [place], [place] [address]    Special Requests   Final    BOTTLES DRAWN AEROBIC AND ANAEROBIC Blood Culture adequate volume Performed at [facility], [address] [place], [place] [address]    Culture   Final    NO GROWTH 5 DAYS Performed at [facility], [address], [place], [place] [address]    Report Status 05/17/2019 FINAL  Final  Culture, Urine     Status: None   Collection Time: 05/12/19 11:45 PM  Result Value Ref Range Status   Specimen Description   Final    URINE, CLEAN CATCH Performed at [facility], [address] [place], [place] [address]    Special Requests   Final    NONE Performed at  [facility], [address], [place], [place] [address]    Culture   Final    NO GROWTH Performed at [facility], [address], [place], [place] [address]    Report Status 05/14/2019 FINAL  Final    Radiology Reports Ct Head Wo Contrast  Result Date: 05/12/2019 CLINICAL DATA:  85 year old male with failure to thrive, altered mental  status. EXAM: CT HEAD WITHOUT CONTRAST TECHNIQUE: Contiguous axial images were obtained from the base of the skull through the vertex without intravenous contrast. COMPARISON:  Head CT 01/09/2018. FINDINGS: Brain: Study is intermittently degraded by motion artifact despite repeated imaging attempts. Fairly symmetric bilateral subdural hygromas or chronic hematomas have increased since 2019 most apparent on the coronal views where the subdural veins are more displaced from the inner table (coronal series 10, images 40 and 41). The collections measure 4-5 millimeters on the left and 3-4 millimeters on the right. There is no associated midline shift. Mild mass effect on both hemispheres. Basilar cisterns remain normal. No superimposed acute intracranial hemorrhage. Patchy and confluent bilateral cerebral white matter hypodensity and evidence of small chronic lacunar infarcts in the right basal ganglia and pons are stable. No ventriculomegaly. No cortically based acute infarct identified. Vascular: Calcified atherosclerosis at the skull base. No suspicious intracranial vascular hyperdensity. Skull: Mild motion artifact. No acute osseous abnormality identified. Sinuses/Orbits: Bubbly opacity in the bilateral maxillary sinuses and left sphenoid with small fluid levels. Chronic sinus mucoperiosteal thickening. The frontal sinuses today are clear. Tympanic cavities and mastoids are clear. Other: No acute orbit or scalp soft tissue findings. IMPRESSION: 1. 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 Mild associated mass effect on both hemispheres with no midline shift. 2. No other acute intracranial abnormality. Chronic small vessel disease is stable since 2019. 3. Positive also for acute on chronic paranasal sinusitis. Electronically Signed   By: Odessa FlemingH  Hall M.D.   On: 05/12/2019 19:47   Dg Chest Port 1 View  Result Date: 05/13/2019 CLINICAL DATA:  Shortness of breath EXAM: PORTABLE CHEST 1 VIEW COMPARISON:  05/12/2019 FINDINGS: There is bilateral diffuse mild interstitial thickening likely chronic. There is increased airspace disease in the right lower lobe and periphery of the left mid lung concerning for superimposed pneumonia. No pleural effusion or pneumothorax. The heart mediastinum are stable. The osseous structures are unremarkable. IMPRESSION: Mild bilateral chronic interstitial lung disease. Increased airspace disease in the right lower lobe and periphery of the left mid lung concerning for superimposed pneumonia. Electronically Signed   By: Elige KoHetal  Patel   On: 05/13/2019 09:39   Dg Chest Port 1 View  Result Date: 05/12/2019 CLINICAL DATA:  Positive COVID-19.  Failure to thrive. EXAM: PORTABLE CHEST 1 VIEW COMPARISON:  May 08, 2019 FINDINGS: There is patchy opacity in the right base. The lungs elsewhere are clear. Heart is upper normal in size with pulmonary vascularity normal. No adenopathy. There is aortic atherosclerosis. No bone lesions. IMPRESSION: Patchy infiltrate consistent with pneumonia right base. Lungs elsewhere clear. Heart upper normal in size. No adenopathy evident. Aortic Atherosclerosis (ICD10-I70.0). Electronically Signed   By: Bretta BangWilliam  Woodruff III M.D.   On: 05/12/2019 19:03   Dg Chest Port 1 View  Result Date: 05/08/2019 CLINICAL DATA:  Fevers EXAM: PORTABLE CHEST 1 VIEW COMPARISON:  01/09/2018 FINDINGS: Cardiac shadow is stable. Aortic calcifications are noted. The lungs are well aerated bilaterally. No focal infiltrate is seen. Mild  increased interstitial markings are noted stable from the prior exam. This may represent mild edema. No bony abnormality is seen. IMPRESSION: Stable mild interstitial changes which may represent edema. Electronically Signed   By: Alcide CleverMark  Lukens M.D.   On: 05/08/2019 14:44

## 2019-05-18 DIAGNOSIS — H539 Unspecified visual disturbance: Secondary | ICD-10-CM | POA: Diagnosis not present

## 2019-05-18 NOTE — Progress Notes (Signed)
PROGRESS NOTE                                                                                                                                                                                                             Patient Demographics:    Martin Tanner, is a 85 y.o. male, DOB - 1928-09-04, ZOX:[id number]  Outpatient Primary MD for the patient is System, Pcp Not In    LOS - 6  Chief Complaint  Patient presents with  . Weakness  . Urinary Incontinence       Brief Narrative: Patient is a 85 y.o. male with PMHx of dementia, CKD stage III, presented with fever and altered mental status-found to have COVID-19 pneumonia.  See below for further details   Subjective:    Martin Tanner awake, does not interact   Assessment  & Plan :   Pneumonia secondary to COVID 19: Slowly improving-thankfully he is not requiring any oxygen-inflammatory markers are trending down.  He is profoundly weak and very frail-has failure to thrive syndrome and is at potential to deteriorate further.  Start tapering steroids (started on 5/20) down-suspect requires a short course of 3 to 4 days.  Patient is not a good candidate for aggressive care-if he does deteriorate-may be best served by comfort measures.  Patient is a DNR, I again spoke to patient's grandson over the phone-family is aware of tenuous clinical situation-they are hopeful of eventual recovery.  They do realize that if patient deteriorates-he is not a candidate for any further escalation in care, and we may have no option but to pursue comfort measures.  Coagulase-negative bacteremia: Likely a contaminant-and not a infection.  Only 1/2 bottles positive.  No further antimicrobial therapy is required at this point  Severe debility/deconditioning: Very frail and weak-major barrier to discharge. Seen by PT with recommendations for SNF on discharge.  Family is agreeable. Social work consulted  Dysphagia:  Suspect dysphagia secondary to dementia-probably worsened by severe deconditioning/frailty.  Speech therapy following-continue with dysphagia 1 diet.  Pancytopenia: Appears to be chronic-probably worsened due to COVID-19.  Continue to follow  CKD stage III: Creatinine appears to be fluctuating at times but nevertheless still close to usual baseline.  Follow periodically  Hypertension: Controlled with Coreg  Dementia: Pleasantly confused-at risk for delirium.  COVID-19 Labs:  Recent Labs    05/16/19  0400 05/17/19 0532  DDIMER 3.00* 2.60*  FERRITIN 629* 471*  CRP 5.1* 2.5*    Lab Results  Component Value Date   SARSCOV2NAA POSITIVE (A) 05/08/2019     COVID-19 medications: 5/20>>Solumedrol    ABG: No results found for: PHART, PCO2ART, PO2ART, HCO3, TCO2, ACIDBASEDEF, O2SAT  Condition - gaurded  Family Communication  : Grandson over the phone on 5/25  Code Status : DNR  Diet : Dysphagia 1 diet  Disposition Plan  :  Remain inpatient, awaiting SNF placement  Consults  :  None  Procedures  :   None  DVT Prophylaxis  :  lovenox  Lab Results  Component Value Date   PLT 114 (L) 05/17/2019    Inpatient Medications  Scheduled Meds: . carvedilol  3.125 mg Oral BID WC  . enoxaparin (LOVENOX) injection  30 mg Subcutaneous Q24H  . feeding supplement  1 Container Oral TID BM  . methylPREDNISolone (SOLU-MEDROL) injection  40 mg Intravenous Q12H  . pantoprazole  40 mg Oral Daily  . vitamin C  500 mg Oral Daily  . zinc sulfate  220 mg Oral Daily   Continuous Infusions: PRN Meds:.  Antibiotics  :    Anti-infectives (From admission, onward)   Start     Dose/Rate Route Frequency Ordered Stop   05/13/19 1800  cefTRIAXone (ROCEPHIN) 1 g in sodium chloride 0.9 % 100 mL IVPB  Status:  Discontinued     1 g 200 mL/hr over 30 Minutes Intravenous Every 24 hours 05/13/19 0232 05/13/19 1106   05/13/19 1800  azithromycin (ZITHROMAX) 500 mg in sodium chloride 0.9 % 250 mL IVPB   Status:  Discontinued     500 mg 250 mL/hr over 60 Minutes Intravenous Every 24 hours 05/13/19 0232 05/13/19 1106   05/12/19 2030  azithromycin (ZITHROMAX) 500 mg in sodium chloride 0.9 % 250 mL IVPB     500 mg 250 mL/hr over 60 Minutes Intravenous  Once 05/12/19 2015 05/12/19 2158   05/12/19 1730  cefTRIAXone (ROCEPHIN) 1 g in sodium chloride 0.9 % 100 mL IVPB     1 g 200 mL/hr over 30 Minutes Intravenous  Once 05/12/19 1723 05/12/19 1836       Time Spent in minutes 25    Erick BlinksJehanzeb Millee Denise M.D on 05/18/2019 at 4:56 PM  To page go to [email or web address] - use universal password  [facility] -  Office  [phone]  See all Orders from today for further details   Admit date - 05/12/2019    6    Objective:   Vitals:   05/18/19 1330 05/18/19 1543 05/18/19 1623 05/18/19 1625  BP: 124/73  109/70 109/70  Pulse: 75  61 61  Resp:    16  Temp:  (!) 97.3 F (36.3 C)    TempSrc:  Oral    SpO2: 93%   94%  Weight:      Height:        Wt Readings from Last 3 Encounters:  05/12/19 45.4 kg  01/11/18 44.6 kg  11/18/17 43.5 kg     Intake/Output Summary (Last 24 hours) at 05/18/2019 1656 Last data filed at 05/18/2019 1537 Gross per 24 hour  Intake 120 ml  Output 775 ml  Net -655 ml     Physical Exam General exam: Alert, awake, no distress Respiratory system: Clear to auscultation. Respiratory effort normal. Cardiovascular system:RRR. No murmurs, rubs, gallops. Gastrointestinal system: Abdomen is nondistended, soft and nontender. No organomegaly or masses felt. Normal bowel sounds heard.  Central nervous system: No focal neurological deficits. Extremities: No C/C/E, +pedal pulses Skin: No rashes, lesions or ulcers      Data Review:    CBC Recent Labs  Lab 05/13/19 0527 05/14/19 0614 05/15/19 0622 05/16/19 0400 05/17/19 0532  WBC 3.1* 2.2* 6.9 5.3 4.6  HGB 12.2* 12.6* 11.3* 11.3* 11.2*  HCT 36.2* 37.0* 33.6* 33.1* 32.0*  PLT 95* 93* 108* 108* 114*  MCV 89.4  89.8 88.9 89.5 89.4  MCH 30.1 30.6 29.9 30.5 31.3  MCHC 33.7 34.1 33.6 34.1 35.0  RDW 14.0 14.3 14.1 14.1 14.0  LYMPHSABS 0.5* 0.5* 0.3* 0.3* 0.3*  MONOABS 0.1 0.1 0.1 0.1 0.1  EOSABS 0.0 0.0 0.0 0.0 0.0  BASOSABS 0.0 0.0 0.0 0.0 0.0    Chemistries  Recent Labs  Lab 05/13/19 0527 05/14/19 0614 05/15/19 0622 05/16/19 0400 05/17/19 0532  NA 140 140 138 141 141  K 3.6 4.0 3.7 3.8 3.3*  CL 110 110 107 109 111  CO2 19* 19* 24 21* 20*  GLUCOSE 99 150* 151* 152* 185*  BUN 22 40* 48* 47* 41*  CREATININE 1.34* 1.39* 1.43* 1.28* 1.10  CALCIUM 8.2* 8.6* 8.5* 8.5* 8.4*  MG  --  2.2 2.3 2.5* 2.4  AST 84* 80* 50* 38 32  ALT ALKPHOS 51 47 50 51 49  BILITOT 0.7 1.0 0.6 0.3 0.3   ------------------------------------------------------------------------------------------------------------------ No results for input(s): CHOL, HDL, LDLCALC, TRIG, CHOLHDL, LDLDIRECT in the last 72 hours.  No results found for: HGBA1C ------------------------------------------------------------------------------------------------------------------ No results for input(s): TSH, T4TOTAL, T3FREE, THYROIDAB in the last 72 hours.  Invalid input(s): FREET3 ------------------------------------------------------------------------------------------------------------------ Recent Labs    05/16/19 0400 05/17/19 0532  FERRITIN 629* 471*    Coagulation profile No results for input(s): INR, PROTIME in the last 168 hours.  Recent Labs    05/16/19 0400 05/17/19 0532  DDIMER 3.00* 2.60*    Cardiac Enzymes Recent Labs  Lab 05/12/19 2040 05/13/19 0106 05/13/19 0528  TROPONINI 0.24* 0.26* 0.24*   ------------------------------------------------------------------------------------------------------------------ No results found for: BNP  Micro Results Recent Results (from the past 240 hour(s))  Culture, blood (Routine X 2) w Reflex to ID Panel     Status: Abnormal   Collection Time:  05/12/19  8:40 PM  Result Value Ref Range Status   Specimen Description BLOOD  Final   Special Requests   Final    BOTTLES DRAWN AEROBIC AND ANAEROBIC Blood Culture adequate volume   Culture  Setup Time   Final    GRAM POSITIVE COCCI IN CLUSTERS AEROBIC BOTTLE ONLY CRITICAL RESULT CALLED TO, READ BACK BY AND VERIFIED WITH: PHARMD N BATCHELDER [id number] AT 646AM BY CM    Culture (A)  Final    STAPHYLOCOCCUS SPECIES (COAGULASE NEGATIVE) THE SIGNIFICANCE OF ISOLATING THIS ORGANISM FROM A SINGLE SET OF BLOOD CULTURES WHEN MULTIPLE SETS ARE DRAWN IS UNCERTAIN. PLEASE NOTIFY THE MICROBIOLOGY DEPARTMENT WITHIN ONE WEEK IF SPECIATION AND SENSITIVITIES ARE REQUIRED.    Report Status 05/15/2019 FINAL  Final  Blood Culture ID Panel (Reflexed)     Status: None   Collection Time: 05/12/19  8:40 PM  Result Value Ref Range Status   Enterococcus species NOT DETECTED NOT DETECTED Final   Listeria monocytogenes NOT DETECTED NOT DETECTED Final   Staphylococcus species NOT DETECTED NOT DETECTED Final   Staphylococcus aureus (BCID) NOT DETECTED NOT DETECTED Final   Streptococcus species NOT DETECTED NOT DETECTED Final   Streptococcus agalactiae NOT DETECTED NOT DETECTED Final  Streptococcus pneumoniae NOT DETECTED NOT DETECTED Final   Streptococcus pyogenes NOT DETECTED NOT DETECTED Final   Acinetobacter baumannii NOT DETECTED NOT DETECTED Final   Enterobacteriaceae species NOT DETECTED NOT DETECTED Final   Enterobacter cloacae complex NOT DETECTED NOT DETECTED Final   Escherichia coli NOT DETECTED NOT DETECTED Final   Klebsiella oxytoca NOT DETECTED NOT DETECTED Final   Klebsiella pneumoniae NOT DETECTED NOT DETECTED Final   Proteus species NOT DETECTED NOT DETECTED Final   Serratia marcescens NOT DETECTED NOT DETECTED Final   Haemophilus influenzae NOT DETECTED NOT DETECTED Final   Neisseria meningitidis NOT DETECTED NOT DETECTED Final   Pseudomonas aeruginosa NOT DETECTED NOT DETECTED Final    Candida albicans NOT DETECTED NOT DETECTED Final   Candida glabrata NOT DETECTED NOT DETECTED Final   Candida krusei NOT DETECTED NOT DETECTED Final   Candida parapsilosis NOT DETECTED NOT DETECTED Final   Candida tropicalis NOT DETECTED NOT DETECTED Final    Comment: Performed at [facility], [address], [place], [place] [address]  Culture, blood (Routine X 2) w Reflex to ID Panel     Status: None   Collection Time: 05/12/19  8:45 PM  Result Value Ref Range Status   Specimen Description   Final    BLOOD Performed at [facility], [address], [place], [place] [address]    Special Requests   Final    BOTTLES DRAWN AEROBIC AND ANAEROBIC Blood Culture adequate volume Performed at [facility], [address] [place], [place] [address]    Culture   Final    NO GROWTH 5 DAYS Performed at [facility], [address] [place], [place] [address]    Report Status 05/17/2019 FINAL  Final  Culture, Urine     Status: None   Collection Time: 05/12/19 11:45 PM  Result Value Ref Range Status   Specimen Description   Final    URINE, CLEAN CATCH Performed at [facility], [address] [place], [place] [address]    Special Requests   Final    NONE Performed at [facility], [address], [place], [place] [address]    Culture   Final    NO GROWTH Performed at [facility], [address], [place], [place] [address]    Report Status 05/14/2019 FINAL  Final    Radiology Reports Ct Head Wo Contrast  Result Date: 05/12/2019 CLINICAL DATA:  85 year old male with failure to thrive, altered mental status. EXAM: CT HEAD WITHOUT CONTRAST TECHNIQUE: Contiguous axial images were obtained from the base of the skull through the vertex without intravenous contrast. COMPARISON:  Head CT 01/09/2018. FINDINGS: Brain: Study is intermittently degraded by motion artifact despite repeated imaging  attempts. Fairly symmetric bilateral subdural hygromas or chronic hematomas have increased since 2019 most apparent on the coronal views where the subdural veins are more displaced from the inner table (coronal series 10, images 40 and 41). The collections measure 4-5 millimeters on the left and 3-4 millimeters on the right. There is no associated midline shift. Mild mass effect on both hemispheres. Basilar cisterns remain normal. No superimposed acute intracranial hemorrhage. Patchy and confluent bilateral cerebral white matter hypodensity and evidence of small chronic lacunar infarcts in the right basal ganglia and pons are stable. No ventriculomegaly. No cortically based acute infarct identified. Vascular: Calcified atherosclerosis at the skull base. No suspicious intracranial vascular hyperdensity. Skull: Mild motion artifact. No acute osseous abnormality  identified. Sinuses/Orbits: Bubbly opacity in the bilateral maxillary sinuses and left sphenoid with small fluid levels. Chronic sinus mucoperiosteal thickening. The frontal sinuses today are clear. Tympanic cavities and mastoids are clear. Other: No acute orbit or scalp soft tissue findings. IMPRESSION: 1. 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 Mild associated mass effect on both hemispheres with no midline shift. 2. No other acute intracranial abnormality. Chronic small vessel disease is stable since 2019. 3. Positive also for acute on chronic paranasal sinusitis. Electronically Signed   By: Odessa Fleming M.D.   On: 05/12/2019 19:47   Dg Chest Port 1 View  Result Date: 05/13/2019 CLINICAL DATA:  Shortness of breath EXAM: PORTABLE CHEST 1 VIEW COMPARISON:  05/12/2019 FINDINGS: There is bilateral diffuse mild interstitial thickening likely chronic. There is increased airspace disease in the right lower lobe and periphery of the left mid lung concerning for superimposed  pneumonia. No pleural effusion or pneumothorax. The heart mediastinum are stable. The osseous structures are unremarkable. IMPRESSION: Mild bilateral chronic interstitial lung disease. Increased airspace disease in the right lower lobe and periphery of the left mid lung concerning for superimposed pneumonia. Electronically Signed   By: Elige Ko   On: 05/13/2019 09:39   Dg Chest Port 1 View  Result Date: 05/12/2019 CLINICAL DATA:  Positive COVID-19.  Failure to thrive. EXAM: PORTABLE CHEST 1 VIEW COMPARISON:  May 08, 2019 FINDINGS: There is patchy opacity in the right base. The lungs elsewhere are clear. Heart is upper normal in size with pulmonary vascularity normal. No adenopathy. There is aortic atherosclerosis. No bone lesions. IMPRESSION: Patchy infiltrate consistent with pneumonia right base. Lungs elsewhere clear. Heart upper normal in size. No adenopathy evident. Aortic Atherosclerosis (ICD10-I70.0). Electronically Signed   By: Bretta Bang III M.D.   On: 05/12/2019 19:03   Dg Chest Port 1 View  Result Date: 05/08/2019 CLINICAL DATA:  Fevers EXAM: PORTABLE CHEST 1 VIEW COMPARISON:  01/09/2018 FINDINGS: Cardiac shadow is stable. Aortic calcifications are noted. The lungs are well aerated bilaterally. No focal infiltrate is seen. Mild increased interstitial markings are noted stable from the prior exam. This may represent mild edema. No bony abnormality is seen. IMPRESSION: Stable mild interstitial changes which may represent edema. Electronically Signed   By: Alcide Clever M.D.   On: 05/08/2019 14:44

## 2019-05-18 NOTE — Progress Notes (Signed)
Facetime call made to grandson so he could see and talk with patient. All questions were answered.

## 2019-05-18 NOTE — Progress Notes (Signed)
Updated Pt's grandson, and then grandson called back and face timed grandson.

## 2019-05-18 NOTE — Progress Notes (Addendum)
Physical Therapy Treatment Patient Details Name: Martin Tanner HarbourBahadur Burandt MRN: [id number] DOB: 07/07/1928 Today's Date: 05/18/2019    History of Present Illness 85 year old Nepalese speaking gentleman with advanced underlying dementia, chronic kidney disease stage III, chronic pancytopenia, anemia, history of UGI bleed in the past, BPH.  He lives [place] and family members in [place].  He was brought here for running fevers chest x-ray assessed of a viral pneumonia, CT head showed Chronic Hygromas he also tested positive for COVID-19 and he was admitted.    PT Comments     the patient was calm, via interpreter by phone, patient assisted with 1 to recliner. Patient ate a few bites of lunch while sitting on  recliner. Appeared to be holding food in mouth so suctioned mouth of  Food. Gave a few sips of water. Continue to progress  Activity.   Follow Up Recommendations  SNF vs home.     Equipment Recommendations  Wheelchair (measurements PT);Wheelchair cushion (measurements PT)    Recommendations for Other Services       Precautions / Restrictions Precautions Precautions: Fall;Other (comment) Precaution Comments: incontinent    Mobility  Bed Mobility Overal bed mobility: Needs Assistance Bed Mobility: Sit to Supine;Sidelying to Sit Rolling: Supervision Sidelying to sit: Mod assist;HOB elevated   Sit to supine: Mod assist;+2 for physical assistance;HOB elevated   General bed mobility comments: multimodal cues to  initiate, patient then able to assist self to sitting with mod assist at trunk.  Transfers Overall transfer level: Needs assistance Equipment used: 1 person hand held assist Transfers: Sit to/from [employer]Stand;Stand Pivot Transfers Sit to Stand: Min assist Stand pivot transfers: Mod assist       General transfer comment: steady assist to stand and pivot to recliner, then back to bed. patient reaching for chair arm, then reaching for bed to return.  Ambulation/Gait                  Stairs             Wheelchair Mobility    Modified Rankin (Stroke Patients Only)       Balance Overall balance assessment: Needs assistance Sitting-balance support: Feet supported;No upper extremity supported   Sitting balance - Comments: patient sat  at recliner edge and ate some of  the lunch. patient sat without any difficulty.    Standing balance support: During functional activity;Single extremity supported Standing balance-Leahy Scale: Poor Standing balance comment: needs external support in standing.                             Cognition Arousal/Alertness: Awake/alert Behavior During Therapy: WFL for tasks assessed/performed Overall Cognitive Status: Impaired/Different from baseline Area of Impairment: Safety/judgement;Awareness                         Safety/Judgement: Decreased awareness of safety;Decreased awareness of deficits Awareness: Intellectual   General Comments: used audio interpreter, pt. did follow some directions for mobility as asked.      Exercises      General Comments        Pertinent Vitals/Pain Pain Assessment: No/denies pain Faces Pain Scale: No hurt    Home Living                      Prior Function            PT Goals (current goals can now be  found in the care plan section) Progress towards PT goals: Progressing toward goals    Frequency    Min 3X/week      PT Plan Current plan remains appropriate    Co-evaluation              AM-PAC PT "6 Clicks" Mobility   Outcome Measure  Help needed turning from your back to your side while in a flat bed without using bedrails?: A Lot Help needed moving from lying on your back to sitting on the side of a flat bed without using bedrails?: A Lot Help needed moving to and from a bed to a chair (including a wheelchair)?: A Lot Help needed standing up from a chair using your arms (e.g., wheelchair or bedside chair)?: A  Little Help needed to walk in hospital room?: A Lot Help needed climbing 3-5 steps with a railing? : A Lot 6 Click Score: 13    End of Session   Activity Tolerance: Patient tolerated treatment well Patient left: in bed;with call bell/phone within reach;with bed alarm set Nurse Communication: Mobility status PT Visit Diagnosis: Unsteadiness on feet (R26.81);Adult, failure to thrive (R62.7)     Time: 1440-1455 PT Time Calculation (min) (ACUTE ONLY): 15 min  Charges:  $Therapeutic Activity: 8-22 mins                     Blanchard Kelch PT Acute Rehabilitation Services Pager [phone] Office [phone]    [name] 05/18/2019, 5:11 PM

## 2019-05-19 DIAGNOSIS — H539 Unspecified visual disturbance: Secondary | ICD-10-CM | POA: Diagnosis not present

## 2019-05-19 NOTE — Progress Notes (Signed)
PROGRESS NOTE                                                                                                                                                                                                             Patient Demographics:    Martin Tanner, is a 85 y.o. male, DOB - 1928/06/15, WGN:[id number]  Outpatient Primary MD for the patient is System, Pcp Not In    LOS - 7  Chief Complaint  Patient presents with  . Weakness  . Urinary Incontinence       Brief Narrative: Patient is a 85 y.o. male with PMHx of dementia, CKD stage III, presented with fever and altered mental status-found to have COVID-19 pneumonia.  See below for further details   Subjective:    Martin Tanner sitting up in chair, no distress, appears comfortable.  Assessment  & Plan :   Pneumonia secondary to COVID 19: Slowly improving-thankfully he is not requiring any oxygen-inflammatory markers are trending down.  He is profoundly weak and very frail-has failure to thrive syndrome and is at potential to deteriorate further.  Start tapering steroids (started on 5/20) down-suspect requires a short course of 3 to 4 days.  Patient is not a good candidate for aggressive care-if he does deteriorate-may be best served by comfort measures.  Patient is a DNR, I again spoke to patient's grandson over the phone-family is aware of tenuous clinical situation-they are hopeful of eventual recovery.  They do realize that if patient deteriorates-he is not a candidate for any further escalation in care, and we may have no option but to pursue comfort measures.  Coagulase-negative bacteremia: Likely a contaminant-and not a infection.  Only 1/2 bottles positive.  No further antimicrobial therapy is required at this point  Severe debility/deconditioning: Very frail and weak-major barrier to discharge. Seen by PT with recommendations for SNF on discharge.  Family is agreeable.  Social  work is helping arrange placement.  Repeat COVID-19 testing has been ordered.  Dysphagia: Suspect dysphagia secondary to dementia-probably worsened by severe deconditioning/frailty.  Speech therapy following-continue with dysphagia 1 diet.  Pancytopenia: Appears to be chronic-probably worsened due to COVID-19.  Continue to follow  CKD stage III: Creatinine appears to be fluctuating at times but nevertheless still close to usual baseline.  Follow periodically  Hypertension: Controlled with Coreg  Dementia: Pleasantly  confused-at risk for delirium.  COVID-19 Labs:  Recent Labs    05/17/19 0532  DDIMER 2.60*  FERRITIN 471*  CRP 2.5*    Lab Results  Component Value Date   SARSCOV2NAA POSITIVE (A) 05/08/2019     COVID-19 medications: 5/20>>Solumedrol    ABG: No results found for: PHART, PCO2ART, PO2ART, HCO3, TCO2, ACIDBASEDEF, O2SAT  Condition - gaurded  Family Communication  : Grandson over the phone on 5/26  Code Status : DNR  Diet : Vegetarian diet  Disposition Plan  :  Remain inpatient, awaiting SNF placement  Consults  :  None  Procedures  :   None  DVT Prophylaxis  :  lovenox  Lab Results  Component Value Date   PLT 114 (L) 05/17/2019    Inpatient Medications  Scheduled Meds: . carvedilol  3.125 mg Oral BID WC  . enoxaparin (LOVENOX) injection  30 mg Subcutaneous Q24H  . feeding supplement  1 Container Oral TID BM  . methylPREDNISolone (SOLU-MEDROL) injection  40 mg Intravenous Q12H  . pantoprazole  40 mg Oral Daily  . vitamin C  500 mg Oral Daily  . zinc sulfate  220 mg Oral Daily   Continuous Infusions: PRN Meds:.  Antibiotics  :    Anti-infectives (From admission, onward)   Start     Dose/Rate Route Frequency Ordered Stop   05/13/19 1800  cefTRIAXone (ROCEPHIN) 1 g in sodium chloride 0.9 % 100 mL IVPB  Status:  Discontinued     1 g 200 mL/hr over 30 Minutes Intravenous Every 24 hours 05/13/19 0232 05/13/19 1106   05/13/19 1800   azithromycin (ZITHROMAX) 500 mg in sodium chloride 0.9 % 250 mL IVPB  Status:  Discontinued     500 mg 250 mL/hr over 60 Minutes Intravenous Every 24 hours 05/13/19 0232 05/13/19 1106   05/12/19 2030  azithromycin (ZITHROMAX) 500 mg in sodium chloride 0.9 % 250 mL IVPB     500 mg 250 mL/hr over 60 Minutes Intravenous  Once 05/12/19 2015 05/12/19 2158   05/12/19 1730  cefTRIAXone (ROCEPHIN) 1 g in sodium chloride 0.9 % 100 mL IVPB     1 g 200 mL/hr over 30 Minutes Intravenous  Once 05/12/19 1723 05/12/19 1836       Time Spent in minutes 15    Erick Blinks M.D on 05/19/2019 at 5:34 PM  To page go to [email or web address] - use universal password  [facility] -  Office  [phone]  See all Orders from today for further details   Admit date - 05/12/2019    7    Objective:   Vitals:   05/19/19 0532 05/19/19 0802 05/19/19 1222 05/19/19 1605  BP: 122/76 136/82 122/81 116/73  Pulse: 66 66 62 70  Resp: Temp: 97.6 F (36.4 C) (!) 97.3 F (36.3 C) 97.8 F (36.6 C)   TempSrc: Oral Oral Axillary   SpO2: 97% 94%  96%  Weight:      Height:        Wt Readings from Last 3 Encounters:  05/12/19 45.4 kg  01/11/18 44.6 kg  11/18/17 43.5 kg     Intake/Output Summary (Last 24 hours) at 05/19/2019 1734 Last data filed at 05/19/2019 0535 Gross per 24 hour  Intake 240 ml  Output 375 ml  Net -135 ml     Physical Exam General exam: Alert, awake, no distress Respiratory system: Clear to auscultation. Respiratory effort normal. Cardiovascular system:RRR. No murmurs, rubs, gallops. Gastrointestinal system: Abdomen  is nondistended, soft and nontender. No organomegaly or masses felt. Normal bowel sounds heard.         Data Review:    CBC Recent Labs  Lab 05/13/19 0527 05/14/19 0614 05/15/19 0622 05/16/19 0400 05/17/19 0532  WBC 3.1* 2.2* 6.9 5.3 4.6  HGB 12.2* 12.6* 11.3* 11.3* 11.2*  HCT 36.2* 37.0* 33.6* 33.1* 32.0*  PLT 95* 93* 108* 108* 114*  MCV  89.4 89.8 88.9 89.5 89.4  MCH 30.1 30.6 29.9 30.5 31.3  MCHC 33.7 34.1 33.6 34.1 35.0  RDW 14.0 14.3 14.1 14.1 14.0  LYMPHSABS 0.5* 0.5* 0.3* 0.3* 0.3*  MONOABS 0.1 0.1 0.1 0.1 0.1  EOSABS 0.0 0.0 0.0 0.0 0.0  BASOSABS 0.0 0.0 0.0 0.0 0.0    Chemistries  Recent Labs  Lab 05/13/19 0527 05/14/19 0614 05/15/19 0622 05/16/19 0400 05/17/19 0532  NA 140 140 138 141 141  K 3.6 4.0 3.7 3.8 3.3*  CL 110 110 107 109 111  CO2 19* 19* 24 21* 20*  GLUCOSE 99 150* 151* 152* 185*  BUN 22 40* 48* 47* 41*  CREATININE 1.34* 1.39* 1.43* 1.28* 1.10  CALCIUM 8.2* 8.6* 8.5* 8.5* 8.4*  MG  --  2.2 2.3 2.5* 2.4  AST 84* 80* 50* 38 32  ALT 28 28 26 23 23   ALKPHOS 51 47 50 51 49  BILITOT 0.7 1.0 0.6 0.3 0.3   ------------------------------------------------------------------------------------------------------------------ No results for input(s): CHOL, HDL, LDLCALC, TRIG, CHOLHDL, LDLDIRECT in the last 72 hours.  No results found for: HGBA1C ------------------------------------------------------------------------------------------------------------------ No results for input(s): TSH, T4TOTAL, T3FREE, THYROIDAB in the last 72 hours.  Invalid input(s): FREET3 ------------------------------------------------------------------------------------------------------------------ Recent Labs    05/17/19 0532  FERRITIN 471*    Coagulation profile No results for input(s): INR, PROTIME in the last 168 hours.  Recent Labs    05/17/19 0532  DDIMER 2.60*    Cardiac Enzymes Recent Labs  Lab 05/12/19 2040 05/13/19 0106 05/13/19 0528  TROPONINI 0.24* 0.26* 0.24*   ------------------------------------------------------------------------------------------------------------------ No results found for: BNP  Micro Results Recent Results (from the past 240 hour(s))  Culture, blood (Routine X 2) w Reflex to ID Panel     Status: Abnormal   Collection Time: 05/12/19  8:40 PM  Result Value Ref Range  Status   Specimen Description BLOOD  Final   Special Requests   Final    BOTTLES DRAWN AEROBIC AND ANAEROBIC Blood Culture adequate volume   Culture  Setup Time   Final    GRAM POSITIVE COCCI IN CLUSTERS AEROBIC BOTTLE ONLY CRITICAL RESULT CALLED TO, READ BACK BY AND VERIFIED WITH: PHARMD N BATCHELDER [phone] AT 646AM BY CM    Culture (A)  Final    STAPHYLOCOCCUS SPECIES (COAGULASE NEGATIVE) THE SIGNIFICANCE OF ISOLATING THIS ORGANISM FROM A SINGLE SET OF BLOOD CULTURES WHEN MULTIPLE SETS ARE DRAWN IS UNCERTAIN. PLEASE NOTIFY THE MICROBIOLOGY DEPARTMENT WITHIN ONE WEEK IF SPECIATION AND SENSITIVITIES ARE REQUIRED.    Report Status 05/15/2019 FINAL  Final  Blood Culture ID Panel (Reflexed)     Status: None   Collection Time: 05/12/19  8:40 PM  Result Value Ref Range Status   Enterococcus species NOT DETECTED NOT DETECTED Final   Listeria monocytogenes NOT DETECTED NOT DETECTED Final   Staphylococcus species NOT DETECTED NOT DETECTED Final   Staphylococcus aureus (BCID) NOT DETECTED NOT DETECTED Final   Streptococcus species NOT DETECTED NOT DETECTED Final   Streptococcus agalactiae NOT DETECTED NOT DETECTED Final   Streptococcus pneumoniae NOT DETECTED NOT DETECTED Final  Streptococcus pyogenes NOT DETECTED NOT DETECTED Final   Acinetobacter baumannii NOT DETECTED NOT DETECTED Final   Enterobacteriaceae species NOT DETECTED NOT DETECTED Final   Enterobacter cloacae complex NOT DETECTED NOT DETECTED Final   Escherichia coli NOT DETECTED NOT DETECTED Final   Klebsiella oxytoca NOT DETECTED NOT DETECTED Final   Klebsiella pneumoniae NOT DETECTED NOT DETECTED Final   Proteus species NOT DETECTED NOT DETECTED Final   Serratia marcescens NOT DETECTED NOT DETECTED Final   Haemophilus influenzae NOT DETECTED NOT DETECTED Final   Neisseria meningitidis NOT DETECTED NOT DETECTED Final   Pseudomonas aeruginosa NOT DETECTED NOT DETECTED Final   Candida albicans NOT DETECTED NOT DETECTED Final    Candida glabrata NOT DETECTED NOT DETECTED Final   Candida krusei NOT DETECTED NOT DETECTED Final   Candida parapsilosis NOT DETECTED NOT DETECTED Final   Candida tropicalis NOT DETECTED NOT DETECTED Final    Comment: Performed at [facility], [address], [place], [place] [address]  Culture, blood (Routine X 2) w Reflex to ID Panel     Status: None   Collection Time: 05/12/19  8:45 PM  Result Value Ref Range Status   Specimen Description   Final    BLOOD Performed at [facility], [address], [place], [place] [address]    Special Requests   Final    BOTTLES DRAWN AEROBIC AND ANAEROBIC Blood Culture adequate volume Performed at [facility], [address], [place], [place] [address]    Culture   Final    NO GROWTH 5 DAYS Performed at [facility], [address], [place], [place] [address]    Report Status 05/17/2019 FINAL  Final  Culture, Urine     Status: None   Collection Time: 05/12/19 11:45 PM  Result Value Ref Range Status   Specimen Description   Final    URINE, CLEAN CATCH Performed at [facility], [address] [place], [place] [address]    Special Requests   Final    NONE Performed at [facility], [address] [place], [place] [address]    Culture   Final    NO GROWTH Performed at [facility], [address] [place], [place] [address]    Report Status 05/14/2019 FINAL  Final    Radiology Reports Ct Head Wo Contrast  Result Date: 05/12/2019 CLINICAL DATA:  85 year old male with failure to thrive, altered mental status. EXAM: CT HEAD WITHOUT CONTRAST TECHNIQUE: Contiguous axial images were obtained from the base of the skull through the vertex without intravenous contrast. COMPARISON:  Head CT 01/09/2018. FINDINGS: Brain: Study is intermittently degraded by motion artifact despite repeated imaging attempts. Fairly symmetric bilateral subdural hygromas or  chronic hematomas have increased since 2019 most apparent on the coronal views where the subdural veins are more displaced from the inner table (coronal series 10, images 40 and 41). The collections measure 4-5 millimeters on the left and 3-4 millimeters on the right. There is no associated midline shift. Mild mass effect on both hemispheres. Basilar cisterns remain normal. No superimposed acute intracranial hemorrhage. Patchy and confluent bilateral cerebral white matter hypodensity and evidence of small chronic lacunar infarcts in the right basal ganglia and pons are stable. No ventriculomegaly. No cortically based acute infarct identified. Vascular: Calcified atherosclerosis at the skull base. No suspicious intracranial vascular hyperdensity. Skull: Mild motion artifact. No acute osseous abnormality identified. Sinuses/Orbits: Bubbly opacity in the bilateral maxillary sinuses  and left sphenoid with small fluid levels. Chronic sinus mucoperiosteal thickening. The frontal sinuses today are clear. Tympanic cavities and mastoids are clear. Other: No acute orbit or scalp soft tissue findings. IMPRESSION: 1. 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 Mild associated mass effect on both hemispheres with no midline shift. 2. No other acute intracranial abnormality. Chronic small vessel disease is stable since 2019. 3. Positive also for acute on chronic paranasal sinusitis. Electronically Signed   By: Odessa Fleming M.D.   On: 05/12/2019 19:47   Dg Chest Port 1 View  Result Date: 05/13/2019 CLINICAL DATA:  Shortness of breath EXAM: PORTABLE CHEST 1 VIEW COMPARISON:  05/12/2019 FINDINGS: There is bilateral diffuse mild interstitial thickening likely chronic. There is increased airspace disease in the right lower lobe and periphery of the left mid lung concerning for superimposed pneumonia. No pleural effusion or pneumothorax. The heart  mediastinum are stable. The osseous structures are unremarkable. IMPRESSION: Mild bilateral chronic interstitial lung disease. Increased airspace disease in the right lower lobe and periphery of the left mid lung concerning for superimposed pneumonia. Electronically Signed   By: Elige Ko   On: 05/13/2019 09:39   Dg Chest Port 1 View  Result Date: 05/12/2019 CLINICAL DATA:  Positive COVID-19.  Failure to thrive. EXAM: PORTABLE CHEST 1 VIEW COMPARISON:  May 08, 2019 FINDINGS: There is patchy opacity in the right base. The lungs elsewhere are clear. Heart is upper normal in size with pulmonary vascularity normal. No adenopathy. There is aortic atherosclerosis. No bone lesions. IMPRESSION: Patchy infiltrate consistent with pneumonia right base. Lungs elsewhere clear. Heart upper normal in size. No adenopathy evident. Aortic Atherosclerosis (ICD10-I70.0). Electronically Signed   By: Bretta Bang III M.D.   On: 05/12/2019 19:03   Dg Chest Port 1 View  Result Date: 05/08/2019 CLINICAL DATA:  Fevers EXAM: PORTABLE CHEST 1 VIEW COMPARISON:  01/09/2018 FINDINGS: Cardiac shadow is stable. Aortic calcifications are noted. The lungs are well aerated bilaterally. No focal infiltrate is seen. Mild increased interstitial markings are noted stable from the prior exam. This may represent mild edema. No bony abnormality is seen. IMPRESSION: Stable mild interstitial changes which may represent edema. Electronically Signed   By: Alcide Clever M.D.   On: 05/08/2019 14:44

## 2019-05-19 NOTE — Plan of Care (Signed)
  Problem: Education: Goal: Knowledge of risk factors and measures for prevention of condition will improve Outcome: Progressing   Problem: Coping: Goal: Psychosocial and spiritual needs will be supported Outcome: Progressing   Problem: Respiratory: Goal: Will maintain a patent airway Outcome: Progressing Goal: Complications related to the disease process, condition or treatment will be avoided or minimized Outcome: Progressing   Problem: Health Behavior/Discharge Planning: Goal: Ability to manage health-related needs will improve Outcome: Progressing   Problem: Clinical Measurements: Goal: Ability to maintain clinical measurements within normal limits will improve Outcome: Progressing Goal: Will remain free from infection Outcome: Progressing Goal: Diagnostic test results will improve Outcome: Progressing Goal: Respiratory complications will improve Outcome: Progressing Goal: Cardiovascular complication will be avoided Outcome: Progressing   Problem: Activity: Goal: Risk for activity intolerance will decrease Outcome: Progressing   Problem: Nutrition: Goal: Adequate nutrition will be maintained Outcome: Progressing   Problem: Coping: Goal: Level of anxiety will decrease Outcome: Progressing   Problem: Elimination: Goal: Will not experience complications related to bowel motility Outcome: Progressing Goal: Will not experience complications related to urinary retention Outcome: Progressing   Problem: Pain Managment: Goal: General experience of comfort will improve Outcome: Progressing

## 2019-05-19 NOTE — TOC Progression Note (Addendum)
Transition of Care [facility]) - Progression Note    Patient Details  Name: Tashawn Yasseen Grosz MRN: [id number] Date of Birth: 08/07/1928  [facility] CM/SW Contact  Doy Hutching, [place] Phone Number: 05/19/2019, 2:49 PM  Clinical Narrative:    3:11pm- Spoke with grandson provided all information for [facility], he is aware facility can accept.  Following for additional covid result to dc.   2:49pm-Pt has a bed at [facility], requiring another positive test for admission. Message left to discuss with pt grandson.   Expected Discharge Plan: Skilled Nursing Facility Barriers to Discharge: Barriers Unresolved (comment)(COVID positive Medicaid only)  Expected Discharge Plan and Services Expected Discharge Plan: Skilled Nursing Facility In-house Referral: NA Discharge Planning Services: CM Consult Post Acute Care Choice: Skilled Nursing Facility Living arrangements for the past 2 months: Single Family Home                                       Social Determinants of Health (SDOH) Interventions    Readmission Risk Interventions No flowsheet data found.

## 2019-05-19 NOTE — Progress Notes (Signed)
Occupational Therapy Evaluation Patient Details Name: Martin Tanner MRN: [id number] DOB: 12/05/1928 Today's Date: 05/19/2019    History of Present Illness 85 year old Nepalese speaking gentleman with advanced underlying dementia, chronic kidney disease stage III, chronic pancytopenia, anemia, history of UGI bleed in the past, BPH.  He lives with his son and family members in [place].  He was brought here for running fevers chest x-ray assessed of a viral pneumonia, CT head showed Chronic Hygromas he also tested positive for COVID-19 and he was admitted.   Clinical Impression   Nepali Stratus Interpreter used during session.Difficult to get accurate information form both pt and his grandson. [name] is concerned about his Grandfather coming home due to many people being in the home, in addition to children. He aslo asked about getting documentation for his work regarding his Grandfather being positive for [employer] virus. Nsg made aware and stated she had messaged the MD about this issue. PTA, pt lived with his family. Pt was "weak" but was able to walk. Grandson states "he didn't listen". Pt able to ambulate around room with +1 HHA/Min A. Able to assist with ADL and grooming after set up.  Pt would benefit from rehab at SNF, however, given his dementia and language barrier, he most likely would do better in his home environment if [facility] services are available and his home situation allows for that. If DC home is the plan, he would need someone to physically assist him with mobility and ADL.     Follow Up Recommendations  Home health OT;SNF;Supervision/Assistance - 24 hour(pending progress)    Equipment Recommendations  None recommended by OT(need to confirm with family) [name] states he has a commode and shower chair but unsure if he was aware of what I was referring to   Recommendations for Other Services       Precautions / Restrictions Precautions Precautions: Fall      Mobility  Bed Mobility Overal bed mobility: Needs Assistance Bed Mobility: Sit to Supine       Sit to supine: Mod assist   General bed mobility comments: possibly due to language barrier  Transfers Overall transfer level: Needs assistance Equipment used: 1 person hand held assist Transfers: Sit to/from Stand;Stand Pivot Transfers Sit to Stand: Min assist Stand pivot transfers: Min assist            Balance   Sitting-balance support: Feet supported Sitting balance-Leahy Scale: Fair       Standing balance-Leahy Scale: Poor                             ADL either performed or assessed with clinical judgement   ADL Overall ADL's : Needs assistance/impaired Eating/Feeding: Set up;Sitting   Grooming: Minimal assistance;Sitting   Upper Body Bathing: Minimal assistance;Sitting   Lower Body Bathing: Moderate assistance;Sit to/from stand   Upper Body Dressing : Minimal assistance;Sitting   Lower Body Dressing: Moderate assistance;Sit to/from stand   Toilet Transfer: Minimal assistance;Ambulation   Toileting- Clothing Manipulation and Hygiene: Moderate assistance       Functional mobility during ADLs: Minimal assistance;Cueing for safety(HHA)       Vision   Additional Comments: unsure     Perception     Praxis      Pertinent Vitals/Pain Pain Assessment: Faces Faces Pain Scale: No hurt     Hand Dominance Right   Extremity/Trunk Assessment     Lower Extremity Assessment Lower Extremity Assessment: Generalized weakness  Cervical / Trunk Assessment Cervical / Trunk Assessment: Kyphotic   Communication Communication Communication: HOH;Interpreter utilized   Cognition Arousal/Alertness: Awake/alert Behavior During Therapy: WFL for tasks assessed/performed Overall Cognitive Status: No family/caregiver present to determine baseline cognitive functioning                                 General Comments: pt with dementia. Most likely  more confused than baseline. Ablet o follow simple commands with increased time for processing   General Comments       Exercises     Shoulder Instructions      Home Living Family/patient expects to be discharged to:: Private residence Living Arrangements: Other relatives;Children Available Help at Discharge: Family Type of Home: House Home Access: Stairs to enter Enter[employer] CorporationEntrance Stairs-Number of Steps: 2   Home Layout: One level     Bathroom Shower/Tub: Chief Strategy OfficerTub/shower unit   Bathroom Toilet: Standard Bathroom Accessibility: Yes How Accessible: Accessible via walker Home Equipment: Walker - 2 wheels;Bedside commode;Shower seat          Prior Functioning/Environment Level of Independence: Needs assistance        Comments: per notes, patient has been weak, not eating drinking., in ED 5/15 Grandson states pt is able to walk around adn go to the bathroom but that "he does not listne". Difficult to get PLOF from Grandson        OT Problem List: Decreased strength;Decreased activity tolerance;Impaired balance (sitting and/or standing);Decreased safety awareness;Decreased knowledge of use of DME or AE;Cardiopulmonary status limiting activity      OT Treatment/Interventions: Self-care/ADL training;Therapeutic exercise;Neuromuscular education;Energy conservation;DME and/or AE instruction;Therapeutic activities;Cognitive remediation/compensation;Patient/family education;Balance training    OT Goals(Current goals can be found in the care plan section) Acute Rehab OT Goals Patient Stated Goal: Family goal is for pt to be corona (-) before he returns home OT Goal Formulation: With patient/family Time For Goal Achievement: 06/02/19 Potential to Achieve Goals: Good  OT Frequency: Min 2X/week   Barriers to D/C:            Co-evaluation              AM-PAC OT "6 Clicks" Daily Activity     Outcome Measure Help from another person eating meals?: A Little Help from another person  taking care of personal grooming?: A Little Help from another person toileting, which includes using toliet, bedpan, or urinal?: A Lot Help from another person bathing (including washing, rinsing, drying)?: A Lot Help from another person to put on and taking off regular upper body clothing?: A Little Help from another person to put on and taking off regular lower body clothing?: A Lot 6 Click Score: 15   End of Session Equipment Utilized During Treatment: Gait belt Nurse Communication: Mobility status;Other (comment)(grandson's questions)  Activity Tolerance: Patient tolerated treatment well Patient left: in bed;with call bell/phone within reach;with bed alarm set  OT Visit Diagnosis: Unsteadiness on feet (R26.81);Other abnormalities of gait and mobility (R26.89);Muscle weakness (generalized) (M62.81);Other symptoms and signs involving cognitive function                Time: 1415-1505 OT Time Calculation (min): 50 min Charges:  OT General Charges $OT Visit: 1 Visit OT Evaluation $OT Eval Moderate Complexity: 1 Mod OT Treatments $Self Care/Home Management : 23-37 mins  Luisa DagoHilary Shatima Zalar, OT/L   Acute OT Clinical Specialist Acute Rehabilitation Services Pager [phone] Office [phone]   [facility],HILLARY 05/19/2019, 3:16 PM

## 2019-05-19 NOTE — [place] FL2 (Signed)
[place] MEDICAID FL2 LEVEL OF CARE SCREENING TOOL     IDENTIFICATION  Patient Name: Martin Tanner Birthdate: 09/19/1928 Sex: male Admission Date (Current Location): 05/12/2019  [facility] [place]   Facility and Address:  [facility], [address], [place], [place] [address]      Provider Number: [id number]  Attending Physician Name and Address:  Erick BlinksMemon, Jehanzeb, MD  Relative Name and Phone Number:       Current Level of Care: Hospital Recommended Level of Care: Skilled Nursing Facility Prior Approval Number:    Date Approved/Denied:   PASRR Number:   [id number]  Discharge Plan: SNF    Current Diagnoses: Patient Active Problem List   Diagnosis Date Noted  . Pneumonia due to severe acute respiratory syndrome coronavirus 2 (SARS-CoV-2) 05/12/2019  . Community acquired pneumonia 01/09/2018  . Nausea and vomiting 01/09/2018  . Pneumonia 01/09/2018  . Anemia due to chronic kidney disease 11/18/2017  . CKD (chronic kidney disease) 11/18/2017  . 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 Hematemesis 11/17/2017  . Hyponatremia 04/01/2013  . Pyelonephritis 01/28/2013  . Metabolic acidosis 01/28/2013  . Thrombocytopenia (HCC) 01/28/2013  . UTI (lower urinary tract infection) 01/25/2013  . Urinary retention 01/25/2013  . Anemia 01/25/2013  . Weakness generalized 01/25/2013    Orientation RESPIRATION BLADDER Height & Weight     Self  Normal Incontinent Weight: 100 lb (45.4 kg) Height:  5\' 3"  (160 cm)  BEHAVIORAL SYMPTOMS/MOOD NEUROLOGICAL BOWEL NUTRITION STATUS      Incontinent Diet(Dysphagia1 with Thin Liquids)  AMBULATORY STATUS COMMUNICATION OF NEEDS Skin   Extensive Assist Verbally(Non English Speaking) Normal                       Personal Care Assistance Level of Assistance  Bathing, Feeding, Dressing Bathing Assistance: Limited assistance Feeding assistance: Limited assistance Dressing Assistance: Limited  assistance     Functional Limitations Info  Sight, Hearing, Speech Sight Info: Adequate Hearing Info: Adequate Speech Info: Adequate    SPECIAL CARE FACTORS FREQUENCY  PT (By licensed PT), OT (By licensed OT)     PT Frequency: 3-5 times a week OT Frequency: 3-5 times a week            Contractures Contractures Info: Not present    Additional Factors Info  Code Status, Allergies, Isolation Precautions Code Status Info: DNR Allergies Info: No Known Allergies     Isolation Precautions Info: COVID Positive     Current Medications (05/19/2019):  This is the current hospital active medication list Current Facility-Administered Medications  Medication Dose Route Frequency Provider Last Rate Last Dose  . albuterol (VENTOLIN HFA) 108 (90 Base) MCG/ACT inhaler 2 puff  2 puff Inhalation Q4H PRN Maretta BeesGhimire, Shanker M, MD      . carvedilol (COREG) tablet 3.125 mg  3.125 mg Oral BID WC Leroy SeaSingh, Prashant K, MD   3.125 mg at 05/19/19 0816  . enoxaparin (LOVENOX) injection 30 mg  30 mg Subcutaneous Q24H Maretta BeesGhimire, Shanker M, MD   30 mg at 05/18/19 1623  . feeding supplement (BOOST / RESOURCE BREEZE) liquid 1 Container  1 Container Oral TID BM Erick BlinksMemon, Jehanzeb, MD   1 Container at 05/18/19 2012  . methylPREDNISolone sodium succinate (SOLU-MEDROL) 40 mg/mL injection 40 mg  40 mg Intravenous Q12H Maretta BeesGhimire, Shanker M, MD   40 mg at 05/19/19 0512  . ondansetron (ZOFRAN) injection 4 mg  4 mg Intravenous Q6H PRN Ghimire, Werner LeanShanker M, MD      .  pantoprazole (PROTONIX) EC tablet 40 mg  40 mg Oral Daily Leroy Sea, MD   40 mg at 05/19/19 0817  . vitamin C (ASCORBIC ACID) tablet 500 mg  500 mg Oral Daily Maretta Bees, MD   500 mg at 05/19/19 4580  . zinc sulfate capsule 220 mg  220 mg Oral Daily Maretta Bees, MD   220 mg at 05/19/19 9983     Discharge Medications: Please see discharge summary for a list of discharge medications.  Relevant Imaging Results:  Relevant Lab  Results:   Additional Information SSN [id number]  Doy Hutching, LCSWA

## 2019-05-20 DIAGNOSIS — H539 Unspecified visual disturbance: Secondary | ICD-10-CM | POA: Diagnosis not present

## 2019-05-20 MED ORDER — ENSURE ENLIVE PO LIQD
237.0000 mL | ORAL | Status: DC
  Administered 2019-05-20: 22:00:00 237 mL via ORAL

## 2019-05-20 NOTE — Progress Notes (Signed)
Nutrition Follow-up RD working remotely.  DOCUMENTATION CODES:   Underweight  INTERVENTION:    Recommend start bowel regimen, no BM documented this admission.   D/C Boost Breeze, patient is refusing.   Add Ensure Enlive po at bedtime daily, each supplement provides 350 kcal and 20 grams of protein  Pt receiving Hormel Shake daily with Breakfast which provides 520 kcals and 22 g of protein and Magic cup BID with lunch and dinner, each supplement provides 290 kcal and 9 grams of protein, automatically on meal trays to optimize nutritional intake.   NUTRITION DIAGNOSIS:   Increased nutrient needs related to acute illness(COVID) as evidenced by estimated needs.  Ongoing  GOAL:   Patient will meet greater than or equal to 90% of their needs  Progressing  MONITOR:   PO intake, Supplement acceptance  ASSESSMENT:   85 year old Nepalese speaking male with advanced dementia, CKD stage III, chronic pancytopenia, anemia who lives with family now admitted with COVID 19 PNA.   Patient is currently on a vegetarian diet, consuming 20-50% of meals. He is not accepting the Boost Breeze supplement, will change to Ensure Enlive.   Per discussion with RN, patient consumed 100% of breakfast today.   COVID-19 test negative 5/26; 2 negative tests needed before d/c to SNF.   Labs and medications reviewed.  No new weight available.   Diet Order:   Diet Order            Diet vegetarian Room service appropriate? Yes; Fluid consistency: Thin  Diet effective now              EDUCATION NEEDS:   No education needs have been identified at this time  Skin:  Skin Assessment: Reviewed RN Assessment  Last BM:  no BM documented  Height:   Ht Readings from Last 1 Encounters:  05/12/19 5\' 3"  (1.6 m)    Weight:   Wt Readings from Last 1 Encounters:  05/12/19 45.4 kg    Ideal Body Weight:  56.3 kg  BMI:  Body mass index is 17.71 kg/m.  Estimated Nutritional Needs:   Kcal:   1400-1600  Protein:  75-90 grams  Fluid:  > 1.5 L/day    Joaquin Courts, RD, LDN, CNSC Pager [phone] After Hours Pager [phone]

## 2019-05-20 NOTE — Progress Notes (Signed)
Physical Therapy Treatment Patient Details Name: Martin Tanner MRN: [id number] DOB: 12/17/1928 Today's Date: 05/20/2019    History of Present Illness 85 year old Nepalese speaking gentleman with advanced underlying dementia, chronic kidney disease stage III, chronic pancytopenia, anemia, history of UGI bleed in the past, BPH.  He lives with his son and family members in [place].  He was brought here for running fevers chest x-ray assessed of a viral pneumonia, CT head showed Chronic Hygromas he also tested positive for COVID-19 and he was admitted.    PT Comments    The patient tolerated ambulation with  Assistance for balance. Tended to step on sock which affected  The balance.  Continue PT, attempt ambulation in hall.  Follow Up Recommendations  SNF;Other (comment)     Equipment Recommendations    ? RW. ?WC   Recommendations for Other Services       Precautions / Restrictions Precautions Precautions: Fall Precaution Comments: incontinent    Mobility  Bed Mobility   Bed Mobility: Supine to Sit     Supine to sit: Supervision Sit to supine: Supervision   General bed mobility comments: with multimodal cues for activity  Transfers   Equipment used: 1 person hand held assist Transfers: Sit to/from Stand Sit to Stand: Min assist         General transfer comment: steady assist to stand and pivot to recliner, then back to bed. patient reaching for chair arm, then reaching for bed to return.  Ambulation/Gait Ambulation/Gait assistance: Min assist;Mod assist Gait Distance (Feet): 40 Feet Assistive device: 1 person hand held assist Gait Pattern/deviations: Step-to pattern;Step-through pattern;Narrow base of support     General Gait Details: stepped on sock frequently   Stairs             Wheelchair Mobility    Modified Rankin (Stroke Patients Only)       Balance Overall balance assessment: Needs assistance Sitting-balance support: Feet  supported Sitting balance-Leahy Scale: Good     Standing balance support: During functional activity;Single extremity supported Standing balance-Leahy Scale: Poor Standing balance comment: needs external support in standing.                             Cognition Arousal/Alertness: Awake/alert Behavior During Therapy: WFL for tasks assessed/performed Overall Cognitive Status: No family/caregiver present to determine baseline cognitive functioning                             Awareness: Intellectual   General Comments: pt with dementia. Most likely more confused than baseline. Ablet o follow simple commands with increased time for processing      Exercises      General Comments        Pertinent Vitals/Pain Faces Pain Scale: No hurt    Home Living                      Prior Function            PT Goals (current goals can now be found in the care plan section) Progress towards PT goals: Progressing toward goals    Frequency    Min 3X/week      PT Plan Current plan remains appropriate    Co-evaluation              AM-PAC PT "6 Clicks" Mobility   Outcome Measure  Help needed turning from  your back to your side while in a flat bed without using bedrails?: A Little Help needed moving from lying on your back to sitting on the side of a flat bed without using bedrails?: A Little Help needed moving to and from a bed to a chair (including a wheelchair)?: A Lot Help needed standing up from a chair using your arms (e.g., wheelchair or bedside chair)?: A Lot Help needed to walk in hospital room?: A Lot Help needed climbing 3-5 steps with a railing? : A Lot 6 Click Score: 14    End of Session Equipment Utilized During Treatment: Gait belt Activity Tolerance: Patient tolerated treatment well Patient left: in bed;with call bell/phone within reach Nurse Communication: Mobility status PT Visit Diagnosis: Unsteadiness on feet  (R26.81);Adult, failure to thrive (R62.7)     Time: [id number] PT Time Calculation (min) (ACUTE ONLY): 18 min  Charges:  $Gait Training: 8-22 mins                     Blanchard KelchKaren Yuval Nolet PT Acute Rehabilitation Services Pager [phone] Office [phone]    Rada HayHill, Dayan Desa Elizabeth 05/20/2019, 5:05 PM

## 2019-05-20 NOTE — Progress Notes (Signed)
PROGRESS NOTE                                                                                                                                                                                                             Patient Demographics:    Martin Tanner, is a 85 y.o. male, DOB - 02-Dec-1928, XHF:[id number]  Outpatient Primary MD for the patient is System, Pcp Not In    LOS - 8  Chief Complaint  Patient presents with  . Weakness  . Urinary Incontinence       Brief Narrative: Patient is a 85 y.o. male with PMHx of dementia, CKD stage III, presented with fever and altered mental status-found to have COVID-19 pneumonia.  See below for further details   Subjective:    Martin Tanner sitting up in chair having breakfast.  No distress  Assessment  & Plan :   Pneumonia secondary to COVID 19: Slowly improving-thankfully he is not requiring any oxygen-inflammatory markers are trending down.  He is profoundly weak and very frail-has failure to thrive syndrome and is at potential to deteriorate further.  Start tapering steroids (started on 5/20) down-suspect requires a short course of 3 to 4 days.  Patient is not a good candidate for aggressive care-if he does deteriorate-may be best served by comfort measures.  Patient is a DNR, I again spoke to patient's grandson over the phone-family is aware of tenuous clinical situation-they are hopeful of eventual recovery.  They do realize that if patient deteriorates-he is not a candidate for any further escalation in care, and we may have no option but to pursue comfort measures.  Coagulase-negative bacteremia: Likely a contaminant-and not a infection.  Only 1/2 bottles positive.  No further antimicrobial therapy is required at this point  Severe debility/deconditioning: Very frail and weak-major barrier to discharge. Seen by PT with recommendations for SNF on discharge.  Family is agreeable.  Social work  is helping arrange placement.  Repeat COVID-19 testing has been ordered.  Dysphagia: Suspect dysphagia secondary to dementia-probably worsened by severe deconditioning/frailty.  Speech therapy following-continue with dysphagia 1 diet.  Pancytopenia: Appears to be chronic-probably worsened due to COVID-19.  Continue to follow  CKD stage III: Creatinine appears to be fluctuating at times but nevertheless still close to usual baseline.  Follow periodically  Hypertension: Controlled with Coreg  Dementia:  Pleasantly confused-at risk for delirium.  COVID-19 Labs:  No results for input(s): DDIMER, FERRITIN, LDH, CRP in the last 72 hours.  Lab Results  Component Value Date   SARSCOV2NAA POSITIVE (A) 05/08/2019     COVID-19 medications: 5/20>>Solumedrol    ABG: No results found for: PHART, PCO2ART, PO2ART, HCO3, TCO2, ACIDBASEDEF, O2SAT  Condition - gaurded  Family Communication  : Grandson over the phone on 5/27  Code Status : DNR  Diet : Vegetarian diet  Disposition Plan  :  Remain inpatient, awaiting SNF placement  Consults  :  None  Procedures  :   None  DVT Prophylaxis  :  lovenox  Lab Results  Component Value Date   PLT 114 (L) 05/17/2019    Inpatient Medications  Scheduled Meds: . carvedilol  3.125 mg Oral BID WC  . enoxaparin (LOVENOX) injection  30 mg Subcutaneous Q24H  . feeding supplement (ENSURE ENLIVE)  237 mL Oral Q24H  . methylPREDNISolone (SOLU-MEDROL) injection  40 mg Intravenous Q12H  . pantoprazole  40 mg Oral Daily  . vitamin C  500 mg Oral Daily  . zinc sulfate  220 mg Oral Daily   Continuous Infusions: PRN Meds:.  Antibiotics  :    Anti-infectives (From admission, onward)   Start     Dose/Rate Route Frequency Ordered Stop   05/13/19 1800  cefTRIAXone (ROCEPHIN) 1 g in sodium chloride 0.9 % 100 mL IVPB  Status:  Discontinued     1 g 200 mL/hr over 30 Minutes Intravenous Every 24 hours 05/13/19 0232 05/13/19 1106   05/13/19 1800   azithromycin (ZITHROMAX) 500 mg in sodium chloride 0.9 % 250 mL IVPB  Status:  Discontinued     500 mg 250 mL/hr over 60 Minutes Intravenous Every 24 hours 05/13/19 0232 05/13/19 1106   05/12/19 2030  azithromycin (ZITHROMAX) 500 mg in sodium chloride 0.9 % 250 mL IVPB     500 mg 250 mL/hr over 60 Minutes Intravenous  Once 05/12/19 2015 05/12/19 2158   05/12/19 1730  cefTRIAXone (ROCEPHIN) 1 g in sodium chloride 0.9 % 100 mL IVPB     1 g 200 mL/hr over 30 Minutes Intravenous  Once 05/12/19 1723 05/12/19 1836       Time Spent in minutes 15    Erick BlinksJehanzeb Luna Audia M.D on 05/20/2019 at 5:54 PM  To page go to [email or web address] - use universal password  [facility] -  Office  [phone]  See all Orders from today for further details   Admit date - 05/12/2019    8    Objective:   Vitals:   05/20/19 0605 05/20/19 0900 05/20/19 1100 05/20/19 1712  BP: (!) 152/55 134/79 119/74 128/83  Pulse:      Resp:      Temp: 97.6 F (36.4 C)  97.8 F (36.6 C)   TempSrc: Oral  Oral   SpO2:   95%   Weight:      Height:        Wt Readings from Last 3 Encounters:  05/12/19 45.4 kg  01/11/18 44.6 kg  11/18/17 43.5 kg     Intake/Output Summary (Last 24 hours) at 05/20/2019 1754 Last data filed at 05/20/2019 0930 Gross per 24 hour  Intake 120 ml  Output -  Net 120 ml     Physical Exam General exam: Alert, awake, oriented x 3 Respiratory system: Clear to auscultation. Respiratory effort normal. Cardiovascular system:RRR. No murmurs, rubs, gallops. Gastrointestinal system: Abdomen is nondistended, soft and nontender. No  organomegaly or masses felt. Normal bowel sounds heard.    Data Review:    CBC Recent Labs  Lab 05/14/19 0614 05/15/19 0622 05/16/19 0400 05/17/19 0532  WBC 2.2* 6.9 5.3 4.6  HGB 12.6* 11.3* 11.3* 11.2*  HCT 37.0* 33.6* 33.1* 32.0*  PLT 93* 108* 108* 114*  MCV 89.8 88.9 89.5 89.4  MCH 30.6 29.9 30.5 31.3  MCHC 34.1 33.6 34.1 35.0  RDW 14.3 14.1 14.1  14.0  LYMPHSABS 0.5* 0.3* 0.3* 0.3*  MONOABS 0.1 0.1 0.1 0.1  EOSABS 0.0 0.0 0.0 0.0  BASOSABS 0.0 0.0 0.0 0.0    Chemistries  Recent Labs  Lab 05/14/19 0614 05/15/19 0622 05/16/19 0400 05/17/19 0532  NA 140 138 141 141  K 4.0 3.7 3.8 3.3*  CL 110 107 109 111  CO2 19* 24 21* 20*  GLUCOSE 150* 151* 152* 185*  BUN 40* 48* 47* 41*  CREATININE 1.39* 1.43* 1.28* 1.10  CALCIUM 8.6* 8.5* 8.5* 8.4*  MG 2.2 2.3 2.5* 2.4  AST 80* 50* 38 32  ALT ALKPHOS 47 50 51 49  BILITOT 1.0 0.6 0.3 0.3   ------------------------------------------------------------------------------------------------------------------ No results for input(s): CHOL, HDL, LDLCALC, TRIG, CHOLHDL, LDLDIRECT in the last 72 hours.  No results found for: HGBA1C ------------------------------------------------------------------------------------------------------------------ No results for input(s): TSH, T4TOTAL, T3FREE, THYROIDAB in the last 72 hours.  Invalid input(s): FREET3 ------------------------------------------------------------------------------------------------------------------ No results for input(s): VITAMINB12, FOLATE, FERRITIN, TIBC, IRON, RETICCTPCT in the last 72 hours.  Coagulation profile No results for input(s): INR, PROTIME in the last 168 hours.  No results for input(s): DDIMER in the last 72 hours.  Cardiac Enzymes No results for input(s): CKMB, TROPONINI, MYOGLOBIN in the last 168 hours.  Invalid input(s): CK ------------------------------------------------------------------------------------------------------------------ No results found for: BNP  Micro Results Recent Results (from the past 240 hour(s))  Culture, blood (Routine X 2) w Reflex to ID Panel     Status: Abnormal   Collection Time: 05/12/19  8:40 PM  Result Value Ref Range Status   Specimen Description BLOOD  Final   Special Requests   Final    BOTTLES DRAWN AEROBIC AND ANAEROBIC Blood Culture adequate  volume   Culture  Setup Time   Final    GRAM POSITIVE COCCI IN CLUSTERS AEROBIC BOTTLE ONLY CRITICAL RESULT CALLED TO, READ BACK BY AND VERIFIED WITH: PHARMD N BATCHELDER [id number] AT 646AM BY CM    Culture (A)  Final    STAPHYLOCOCCUS SPECIES (COAGULASE NEGATIVE) THE SIGNIFICANCE OF ISOLATING THIS ORGANISM FROM A SINGLE SET OF BLOOD CULTURES WHEN MULTIPLE SETS ARE DRAWN IS UNCERTAIN. PLEASE NOTIFY THE MICROBIOLOGY DEPARTMENT WITHIN ONE WEEK IF SPECIATION AND SENSITIVITIES ARE REQUIRED.    Report Status 05/15/2019 FINAL  Final  Blood Culture ID Panel (Reflexed)     Status: None   Collection Time: 05/12/19  8:40 PM  Result Value Ref Range Status   Enterococcus species NOT DETECTED NOT DETECTED Final   Listeria monocytogenes NOT DETECTED NOT DETECTED Final   Staphylococcus species NOT DETECTED NOT DETECTED Final   Staphylococcus aureus (BCID) NOT DETECTED NOT DETECTED Final   Streptococcus species NOT DETECTED NOT DETECTED Final   Streptococcus agalactiae NOT DETECTED NOT DETECTED Final   Streptococcus pneumoniae NOT DETECTED NOT DETECTED Final   Streptococcus pyogenes NOT DETECTED NOT DETECTED Final   Acinetobacter baumannii NOT DETECTED NOT DETECTED Final   Enterobacteriaceae species NOT DETECTED NOT DETECTED Final   Enterobacter cloacae complex NOT DETECTED NOT DETECTED Final   Escherichia coli NOT  DETECTED NOT DETECTED Final   Klebsiella oxytoca NOT DETECTED NOT DETECTED Final   Klebsiella pneumoniae NOT DETECTED NOT DETECTED Final   Proteus species NOT DETECTED NOT DETECTED Final   Serratia marcescens NOT DETECTED NOT DETECTED Final   Haemophilus influenzae NOT DETECTED NOT DETECTED Final   Neisseria meningitidis NOT DETECTED NOT DETECTED Final   Pseudomonas aeruginosa NOT DETECTED NOT DETECTED Final   Candida albicans NOT DETECTED NOT DETECTED Final   Candida glabrata NOT DETECTED NOT DETECTED Final   Candida krusei NOT DETECTED NOT DETECTED Final   Candida parapsilosis NOT  DETECTED NOT DETECTED Final   Candida tropicalis NOT DETECTED NOT DETECTED Final    Comment: Performed at [facility], [address] [place], [place] [address]  Culture, blood (Routine X 2) w Reflex to ID Panel     Status: None   Collection Time: 05/12/19  8:45 PM  Result Value Ref Range Status   Specimen Description   Final    BLOOD Performed at [facility], [address] [place], [place] [address]    Special Requests   Final    BOTTLES DRAWN AEROBIC AND ANAEROBIC Blood Culture adequate volume Performed at [facility], [address], [place], [place] [address]    Culture   Final    NO GROWTH 5 DAYS Performed at [facility], [address], [place], [place] [address]    Report Status 05/17/2019 FINAL  Final  Culture, Urine     Status: None   Collection Time: 05/12/19 11:45 PM  Result Value Ref Range Status   Specimen Description   Final    URINE, CLEAN CATCH Performed at [facility], [address] [place], [place] [address]    Special Requests   Final    NONE Performed at [facility], [address], [place], [place] [address]    Culture   Final    NO GROWTH Performed at [facility], [address], [place], [place] [address]    Report Status 05/14/2019 FINAL  Final    Radiology Reports Ct Head Wo Contrast  Result Date: 05/12/2019 CLINICAL DATA:  85 year old male with failure to thrive, altered mental status. EXAM: CT HEAD WITHOUT CONTRAST TECHNIQUE: Contiguous axial images were obtained from the base of the skull through the vertex without intravenous contrast. COMPARISON:  Head CT 01/09/2018. FINDINGS: Brain: Study is intermittently degraded by motion artifact despite repeated imaging attempts. Fairly symmetric bilateral subdural hygromas or chronic hematomas have increased since 2019 most apparent on the coronal views where the subdural veins are more displaced from  the inner table (coronal series 10, images 40 and 41). The collections measure 4-5 millimeters on the left and 3-4 millimeters on the right. There is no associated midline shift. Mild mass effect on both hemispheres. Basilar cisterns remain normal. No superimposed acute intracranial hemorrhage. Patchy and confluent bilateral cerebral white matter hypodensity and evidence of small chronic lacunar infarcts in the right basal ganglia and pons are stable. No ventriculomegaly. No cortically based acute infarct identified. Vascular: Calcified atherosclerosis at the skull base. No suspicious intracranial vascular hyperdensity. Skull: Mild motion artifact. No acute osseous abnormality identified. Sinuses/Orbits: Bubbly opacity in the bilateral maxillary sinuses and left sphenoid with small fluid levels. Chronic sinus mucoperiosteal thickening. The frontal sinuses today are clear. Tympanic cavities and mastoids are clear. Other: No acute orbit or scalp soft tissue findings. IMPRESSION: 1. 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 Mild associated mass effect on both hemispheres with no midline shift. 2. No other acute intracranial abnormality. Chronic small vessel disease is stable since 2019. 3. Positive also for acute on chronic paranasal sinusitis. Electronically Signed   By: Odessa Fleming M.D.   On: 05/12/2019 19:47   Dg Chest Port 1 View  Result Date: 05/13/2019 CLINICAL DATA:  Shortness of breath EXAM: PORTABLE CHEST 1 VIEW COMPARISON:  05/12/2019 FINDINGS: There is bilateral diffuse mild interstitial thickening likely chronic. There is increased airspace disease in the right lower lobe and periphery of the left mid lung concerning for superimposed pneumonia. No pleural effusion or pneumothorax. The heart mediastinum are stable. The osseous structures are unremarkable. IMPRESSION: Mild bilateral chronic interstitial lung disease.  Increased airspace disease in the right lower lobe and periphery of the left mid lung concerning for superimposed pneumonia. Electronically Signed   By: Elige Ko   On: 05/13/2019 09:39   Dg Chest Port 1 View  Result Date: 05/12/2019 CLINICAL DATA:  Positive COVID-19.  Failure to thrive. EXAM: PORTABLE CHEST 1 VIEW COMPARISON:  May 08, 2019 FINDINGS: There is patchy opacity in the right base. The lungs elsewhere are clear. Heart is upper normal in size with pulmonary vascularity normal. No adenopathy. There is aortic atherosclerosis. No bone lesions. IMPRESSION: Patchy infiltrate consistent with pneumonia right base. Lungs elsewhere clear. Heart upper normal in size. No adenopathy evident. Aortic Atherosclerosis (ICD10-I70.0). Electronically Signed   By: Bretta Bang III M.D.   On: 05/12/2019 19:03   Dg Chest Port 1 View  Result Date: 05/08/2019 CLINICAL DATA:  Fevers EXAM: PORTABLE CHEST 1 VIEW COMPARISON:  01/09/2018 FINDINGS: Cardiac shadow is stable. Aortic calcifications are noted. The lungs are well aerated bilaterally. No focal infiltrate is seen. Mild increased interstitial markings are noted stable from the prior exam. This may represent mild edema. No bony abnormality is seen. IMPRESSION: Stable mild interstitial changes which may represent edema. Electronically Signed   By: Alcide Clever M.D.   On: 05/08/2019 14:44

## 2019-05-21 DIAGNOSIS — G20 Parkinson's disease: Secondary | ICD-10-CM | POA: Diagnosis not present

## 2019-05-21 DIAGNOSIS — U071 COVID-19: Secondary | ICD-10-CM | POA: Diagnosis not present

## 2019-05-21 DIAGNOSIS — G960 Cerebrospinal fluid leak: Secondary | ICD-10-CM | POA: Diagnosis not present

## 2019-05-21 DIAGNOSIS — M2550 Pain in unspecified joint: Secondary | ICD-10-CM | POA: Diagnosis not present

## 2019-05-21 DIAGNOSIS — Z7401 Bed confinement status: Secondary | ICD-10-CM | POA: Diagnosis not present

## 2019-05-21 DIAGNOSIS — I10 Essential (primary) hypertension: Secondary | ICD-10-CM | POA: Diagnosis not present

## 2019-05-21 DIAGNOSIS — N183 Chronic kidney disease, stage 3 (moderate): Secondary | ICD-10-CM | POA: Diagnosis not present

## 2019-05-21 DIAGNOSIS — R1312 Dysphagia, oropharyngeal phase: Secondary | ICD-10-CM | POA: Diagnosis not present

## 2019-05-21 DIAGNOSIS — F0281 Dementia in other diseases classified elsewhere with behavioral disturbance: Secondary | ICD-10-CM

## 2019-05-21 DIAGNOSIS — H539 Unspecified visual disturbance: Secondary | ICD-10-CM | POA: Diagnosis not present

## 2019-05-21 LAB — NOVEL CORONAVIRUS, NAA (HOSP ORDER, SEND-OUT TO REF LAB; TAT 18-24 HRS): SARS-CoV-2, NAA: DETECTED — AB

## 2019-05-21 LAB — GLUCOSE, CAPILLARY
Glucose-Capillary: 168 mg/dL — ABNORMAL HIGH (ref 70–99)
Glucose-Capillary: 282 mg/dL — ABNORMAL HIGH (ref 70–99)

## 2019-05-21 MED ORDER — PANTOPRAZOLE SODIUM 40 MG PO TBEC: 40.0000 mg | DELAYED_RELEASE_TABLET | Freq: Every day | ORAL | Status: DC

## 2019-05-21 MED ORDER — ALBUTEROL SULFATE HFA 108 (90 BASE) MCG/ACT IN AERS: 2.0000 | INHALATION_SPRAY | RESPIRATORY_TRACT | Status: DC | PRN

## 2019-05-21 MED ORDER — ASCORBIC ACID 500 MG PO TABS: 500.0000 mg | ORAL_TABLET | Freq: Every day | ORAL | 0 refills | Status: AC

## 2019-05-21 MED ORDER — ZINC SULFATE 220 (50 ZN) MG PO CAPS: 220.0000 mg | ORAL_CAPSULE | Freq: Every day | ORAL | Status: DC

## 2019-05-21 MED ORDER — ENSURE ENLIVE PO LIQD: 237.0000 mL | ORAL | 12 refills | Status: DC

## 2019-05-21 MED ORDER — CARVEDILOL 3.125 MG PO TABS: 3.1250 mg | ORAL_TABLET | Freq: Two times a day (BID) | ORAL | Status: DC

## 2019-05-21 NOTE — Progress Notes (Signed)
Called grandson to inform of discharge.

## 2019-05-21 NOTE — Progress Notes (Signed)
Occupational Therapy Treatment Patient Details Name: Martin Tanner MRN: [id number] DOB: 1928/07/19 Today's Date: 05/21/2019    History of present illness 85 year old Nepalese speaking gentleman with advanced underlying dementia, chronic kidney disease stage III, chronic pancytopenia, anemia, history of UGI bleed in the past, BPH.  He lives with his son and family members in [place].  He was brought here for running fevers chest x-ray assessed of a viral pneumonia, CT head showed Chronic Hygromas he also tested positive for COVID-19 and he was admitted.   OT comments  Pt making progress with mobility and ADL. Able to ambulate around room with +1 HHA. Completing UB ADL and grooming with set up. Min A for LB bathing. PT planning on assessing mobility with use of RW. DC plan remains appropriate.  VSS during session.   Follow Up Recommendations  Home health OT;SNF;Supervision/Assistance - 24 hour    Equipment Recommendations  None recommended by OT    Recommendations for Other Services      Precautions / Restrictions Precautions Precautions: Fall       Mobility Bed Mobility Overal bed mobility: Needs Assistance       Supine to sit: Supervision        Transfers Overall transfer level: Needs assistance Equipment used: 1 person hand held assist Transfers: Sit to/from Stand;Stand Pivot Transfers Sit to Stand: Min assist Stand pivot transfers: Min assist            Balance Overall balance assessment: Needs assistance   Sitting balance-Leahy Scale: Good       Standing balance-Leahy Scale: Poor Standing balance comment: dependent on external support                           ADL either performed or assessed with clinical judgement   ADL Overall ADL's : Needs assistance/impaired     Grooming: Set up;Sitting   Upper Body Bathing: Set up;Sitting   Lower Body Bathing: Minimal assistance;Sit to/from stand           Toilet Transfer: Minimal  assistance;Ambulation Toilet Transfer Details (indicate cue type and reason): using urinal appropriately         Functional mobility during ADLs: Minimal assistance       Vision       Perception     Praxis      Cognition Arousal/Alertness: Awake/alert Behavior During Therapy: Flat affect Overall Cognitive Status: No family/caregiver present to determine baseline cognitive functioning                                 General Comments: following commands        Exercises     Shoulder Instructions       General Comments Ambulated to bathroom x 2. Was unable to get pt to enter bathroom to use toilet    Pertinent Vitals/ Pain       Pain Assessment: Faces Faces Pain Scale: No hurt  Home Living                                          Prior Functioning/Environment              Frequency  Min 2X/week        Progress Toward Goals  OT Goals(current goals can now be  found in the care plan section)  Progress towards OT goals: Progressing toward goals  Acute Rehab OT Goals Patient Stated Goal: Family goal is for pt to be corona (-) before he returns home OT Goal Formulation: With patient/family Time For Goal Achievement: 06/02/19 Potential to Achieve Goals: Good ADL Goals Pt Will Transfer to Toilet: with supervision;ambulating Pt Will Perform Toileting - Clothing Manipulation and hygiene: with supervision;sit to/from stand Additional ADL Goal #1: Pt will complete functional mobility within room with S and necessary AD when walking to the sink for grooming.  Plan Discharge plan remains appropriate    Co-evaluation                 AM-PAC OT "6 Clicks" Daily Activity     Outcome Measure   Help from another person eating meals?: None Help from another person taking care of personal grooming?: A Little Help from another person toileting, which includes using toliet, bedpan, or urinal?: A Lot Help from another person  bathing (including washing, rinsing, drying)?: A Little Help from another person to put on and taking off regular upper body clothing?: A Little Help from another person to put on and taking off regular lower body clothing?: A Lot 6 Click Score: 17    End of Session    OT Visit Diagnosis: Unsteadiness on feet (R26.81);Other abnormalities of gait and mobility (R26.89);Muscle weakness (generalized) (M62.81);Other symptoms and signs involving cognitive function   Activity Tolerance Patient tolerated treatment well   Patient Left in chair;with call bell/phone within reach;with chair alarm set   Nurse Communication Mobility status        Time: [id number] OT Time Calculation (min): 31 min  Charges: OT General Charges $OT Visit: 1 Visit OT Treatments $Self Care/Home Management : 23-37 mins  Luisa DagoHilary Allora Bains, OT/L   Acute OT Clinical Specialist Acute Rehabilitation Services Pager [phone] Office [phone]     [facility] Center[facility],HILLARY 05/21/2019, 1:04 PM

## 2019-05-21 NOTE — TOC Transition Note (Addendum)
Transition of Care [facility]) - CM/SW Discharge Note   Patient Details  Name: Martin Tanner MRN: [id number] Date of Birth: [birth year]  [facility] CM/SW Contact:  Gildardo Griffes, LCSW Phone Number: 05/21/2019, 10:57 AM   Clinical Narrative:     Patient will DC to: [facility] Anticipated DC date: 05/21/2019 Family notified: [name] (grandson) Transport NO:[id number]  Per MD patient ready for DC to [facility] . RN, patient, patient's family, and facility notified of DC. Discharge Summary sent to facility. RN given number for report [phone]. DC packet on chart. Ambulance transport will be requested for patient when nurse notifies CSW. CSW signing off.  [place], [place] [phone]   Final next level of care: Skilled Nursing Facility Barriers to Discharge: No Barriers Identified   Patient Goals and CMS Choice Patient states their goals for this hospitalization and ongoing recovery are:: Patient grandson wants patient to return home but patient must be negative prior to return CMS Medicare.gov Compare Post Acute Care list provided to:: Patient Represenative (must comment)( [name], grandson) Choice offered to / list presented to : (grandson)  Discharge Placement PASRR number recieved: 05/19/19            Patient chooses bed at: Other - please specify in the comment section below:([facility]) Patient to be transferred to facility by: [facility] Name of family member notified: [name] (grandson) Patient and family notified of of transfer: 05/21/19  Discharge Plan and Services In-house Referral: NA Discharge Planning Services: CM Consult Post Acute Care Choice: Skilled Nursing Facility                               Social Determinants of Health (SDOH) Interventions     Readmission Risk Interventions No flowsheet data found.

## 2019-05-21 NOTE — Discharge Instructions (Signed)
Person Under Monitoring Name: Martin Tanner  Location: [address] [place] [address]   Infection Prevention Recommendations for Individuals Confirmed to have, or Being Evaluated for, 2019 Novel Coronavirus (COVID-19) Infection Who Receive Care at Home  Individuals who are confirmed to have, or are being evaluated for, COVID-19 should follow the prevention steps below until a healthcare provider or local or state health department says they can return to normal activities.  Stay home except to get medical care You should restrict activities outside your home, except for getting medical care. Do not go to work, school, or public areas, and do not use public transportation or taxis.  Call ahead before visiting your doctor Before your medical appointment, call the healthcare provider and tell them that you have, or are being evaluated for, COVID-19 infection. This will help the healthcare providers office take steps to keep other people from getting infected. Ask your healthcare provider to call the local or state health department.  Monitor your symptoms Seek prompt medical attention if your illness is worsening (e.g., difficulty breathing). Before going to your medical appointment, call the healthcare provider and tell them that you have, or are being evaluated for, COVID-19 infection. Ask your healthcare provider to call the local or state health department.  Wear a facemask You should wear a facemask that covers your nose and mouth when you are in the same room with other people and when you visit a healthcare provider. People who live with or visit you should also wear a facemask while they are in the same room with you.  Separate yourself from other people in your home As much as possible, you should stay in a different room from other people in your home. Also, you should use a separate bathroom, if available.  Avoid sharing household items You should not  share dishes, drinking glasses, cups, eating utensils, towels, bedding, or other items with other people in your home. After using these items, you should wash them thoroughly with soap and water.  Cover your coughs and sneezes Cover your mouth and nose with a tissue when you cough or sneeze, or you can cough or sneeze into your sleeve. Throw used tissues in a lined trash can, and immediately wash your hands with soap and water for at least 20 seconds or use an alcohol-based hand rub.  Wash your [employer] your hands often and thoroughly with soap and water for at least 20 seconds. You can use an alcohol-based hand sanitizer if soap and water are not available and if your hands are not visibly dirty. Avoid touching your eyes, nose, and mouth with unwashed hands.   Prevention Steps for Caregivers and Household Members of Individuals Confirmed to have, or Being Evaluated for, COVID-19 Infection Being Cared for in the Home  If you live with, or provide care at home for, a person confirmed to have, or being evaluated for, COVID-19 infection please follow these guidelines to prevent infection:  Follow healthcare providers instructions Make sure that you understand and can help the patient follow any healthcare provider instructions for all care.  Provide for the patients basic needs You should help the patient with basic needs in the home and provide support for getting groceries, prescriptions, and other personal needs.  Monitor the patients symptoms If they are getting sicker, call his or her medical provider and tell them that the patient has, or is being evaluated for, COVID-19 infection. This will help the healthcare providers office  take steps to keep other people from getting infected. Ask the healthcare provider to call the local or state health department.  Limit the number of people who have contact with the patient  If possible, have only one caregiver for the  patient.  Other household members should stay in another home or place of residence. If this is not possible, they should stay  in another room, or be separated from the patient as much as possible. Use a separate bathroom, if available.  Restrict visitors who do not have an essential need to be in the home.  Keep older adults, very young children, and other sick people away from the patient Keep older adults, very young children, and those who have compromised immune systems or chronic health conditions away from the patient. This includes people with chronic heart, lung, or kidney conditions, diabetes, and cancer.  Ensure good ventilation Make sure that shared spaces in the home have good air flow, such as from an air conditioner or an opened window, weather permitting.  Wash your hands often  Wash your hands often and thoroughly with soap and water for at least 20 seconds. You can use an alcohol based hand sanitizer if soap and water are not available and if your hands are not visibly dirty.  Avoid touching your eyes, nose, and mouth with unwashed hands.  Use disposable paper towels to dry your hands. If not available, use dedicated cloth towels and replace them when they become wet.  Wear a facemask and gloves  Wear a disposable facemask at all times in the room and gloves when you touch or have contact with the patients blood, body fluids, and/or secretions or excretions, such as sweat, saliva, sputum, nasal mucus, vomit, urine, or feces.  Ensure the mask fits over your nose and mouth tightly, and do not touch it during use.  Throw out disposable facemasks and gloves after using them. Do not reuse.  Wash your hands immediately after removing your facemask and gloves.  If your personal clothing becomes contaminated, carefully remove clothing and launder. Wash your hands after handling contaminated clothing.  Place all used disposable facemasks, gloves, and other waste in a lined  container before disposing them with other household waste.  Remove gloves and wash your hands immediately after handling these items.  Do not share dishes, glasses, or other household items with the patient  Avoid sharing household items. You should not share dishes, drinking glasses, cups, eating utensils, towels, bedding, or other items with a patient who is confirmed to have, or being evaluated for, COVID-19 infection.  After the person uses these items, you should wash them thoroughly with soap and water.  Wash laundry thoroughly  Immediately remove and wash clothes or bedding that have blood, body fluids, and/or secretions or excretions, such as sweat, saliva, sputum, nasal mucus, vomit, urine, or feces, on them.  Wear gloves when handling laundry from the patient.  Read and follow directions on labels of laundry or clothing items and detergent. In general, wash and dry with the warmest temperatures recommended on the label.  Clean all areas the individual has used often  Clean all touchable surfaces, such as counters, tabletops, doorknobs, bathroom fixtures, toilets, phones, keyboards, tablets, and bedside tables, every day. Also, clean any surfaces that may have blood, body fluids, and/or secretions or excretions on them.  Wear gloves when cleaning surfaces the patient has come in contact with.  Use a diluted bleach solution (e.g., dilute bleach with 1 part  bleach and 10 parts water) or a household disinfectant with a label that says EPA-registered for coronaviruses. To make a bleach solution at home, add 1 tablespoon of bleach to 1 quart (4 cups) of water. For a larger supply, add  cup of bleach to 1 gallon (16 cups) of water.  Read labels of cleaning products and follow recommendations provided on product labels. Labels contain instructions for safe and effective use of the cleaning product including precautions you should take when applying the product, such as wearing gloves or  eye protection and making sure you have good ventilation during use of the product.  Remove gloves and wash hands immediately after cleaning.  Monitor yourself for signs and symptoms of illness Caregivers and household members are considered close contacts, should monitor their health, and will be asked to limit movement outside of the home to the extent possible. Follow the monitoring steps for close contacts listed on the symptom monitoring form.   ? If you have additional questions, contact your local health department or call the epidemiologist on call at [phone] (available 24/7). ? This guidance is subject to change. For the most up-to-date guidance from [facility], please refer to their website: TripMetro.huhttps://www.cdc.gov/coronavirus/2019-ncov/hcp/guidance-prevent-spread.html      Person Under Monitoring Name: Martin Toribio HarbourBahadur Tanner  Location: [address] [place]  [phone]   CORONAVIRUS DISEASE 2019 (COVID-19) Guidance for Persons Under Investigation You are being tested for the virus that causes coronavirus disease 2019 (COVID-19). Public health actions are necessary to ensure protection of your health and the health of others, and to prevent further spread of infection. COVID-19 is caused by a virus that can cause symptoms, such as fever, cough, and shortness of breath. The primary transmission from person to person is by coughing or sneezing. On January 22, 2019, the World Health Organization announced a [employer] Emergency of International Concern and on January 23, 2019 the U.S. Department of Health and Human Services declared a public health emergency. If the virus that causesCOVID-19 spreads in the community, it could have severe public health consequences.  As a person under investigation for COVID-19, the [employer] Department of Health and [employer], Division of [employer] advises you to adhere to the following guidance until your test results are reported to  you. If your test result is positive, you will receive additional information from your provider and your local health department at that time.   Remain at home until you are cleared by your health provider or public health authorities.   Keep a log of visitors to your home using the form provided. Any visitors to your home must be aware of your isolation status.  If you plan to move to a new address or leave the county, notify the local health department in your county.  Call a doctor or seek care if you have an urgent medical need. Before seeking medical care, call ahead and get instructions from the provider before arriving at the medical office, clinic or hospital. Notify them that you are being tested for the virus that causes COVID-19 so arrangements can be made, as necessary, to prevent transmission to others in the healthcare setting. Next, notify the local health department in your county.  If a medical emergency arises and you need to call 911, inform the first responders that you are being tested for the virus that causes COVID-19. Next, notify the local health department in your county.  Adhere to all guidance set forth by the [facility] [place]  Division  of Public Health for Home Care of patients that is based on guidance from the Center for Disease Control and Prevention with suspected or confirmed COVID-19. It is provided with this guidance for Persons Under Investigation.  Your health and the health of our community are our top priorities. Public Health officials remain available to provide assistance and counseling to you about COVID-19 and compliance with this guidance.  Provider: ____________________________________________________________ Date: ______/_____/_________  By signing below, you acknowledge that you have read and agree to comply with this Guidance for Persons Under Investigation. ______________________________________________________________ Date:  ______/_____/_________  WHO DO I CALL? You can find a list of local health departments here: [email or web address] Health Department: ____________________________________________________________________ Contact Name: ________________________________________________________________________ Telephone: ___________________________________________________________________________  Marice Potter, [place], Communicable Disease Branch COVID-19 Guidance for Persons Under Investigation February 28, 2019

## 2019-05-21 NOTE — Discharge Summary (Addendum)
PATIENT DETAILS Name: Martin Tanner Age: 85 y.o. Sex: male Date of Birth: 18-Jul-1928 MRN: [id number]. Admitting Physician: Barnetta Chapel, MD WUJ:[id number], Pcp Not In  Admit Date: 05/12/2019 Discharge date: 05/21/2019  Recommendations for Outpatient Follow-up:  1. Follow up with PCP in 1-2 weeks 2. Please obtain BMP/CBC in one week 3. Please ensure speech therapy follow-up at SNF  Admitted From:  Home  Disposition: SNF   Home Health: No  Equipment/Devices: None  Discharge Condition: Stable  CODE STATUS: DNR  Diet recommendation:  Heart Healthy-vegetarian-dysphagia 3 diet  Brief Summary: See H&P, Labs, Consult and Test reports for all details in brief, Patient is a 85 y.o. male with PMHx of dementia, CKD stage III, presented with fever and altered mental status-found to have COVID-19 pneumonia.  See below for further details  Brief Hospital Course: Pneumonia secondary to COVID 19:  Improved-has completed a course of tapering steroids.  Inflammatory markers have come down.  He has been titrated down to room air.    Coagulase-negative bacteremia: Likely a contaminant-and not a infection.  Only 1/2 bottles positive.  No further antimicrobial therapy is required at this point  Severe debility/deconditioning: Very frail and weak-major barrier to discharge. Seen by PT with recommendations for SNF on discharge.  Repeat COVID-19 test is still positive.  Dysphagia: Suspect dysphagia secondary to dementia-probably worsened by severe deconditioning/frailty.  Although speech therapy recommended dysphagia 1 diet-due to patient preference-he has been started on a vegetarian but a regular diet (spoke with RN) which she seems to be tolerating well.  Hence we will discharge him on a dysphagia 3 diet-and have speech therapy follow-up at SNF.  Spoke with patient's grandson over the phone-he is aware of the need for speech therapy follow-up and the risk of aspiration pneumonia in the  setting, but wishes that patient continue with a dysphagia 3 diet for now.  Pancytopenia: Appears to be chronic-probably worsened due to COVID-19.  Continue to follow  CKD stage III: Creatinine appears to be fluctuating at times but nevertheless still close to usual baseline.  Follow periodically  Hypertension: Controlled with Coreg  Dementia: Pleasantly confused-at risk for delirium.  Procedures/Studies: None  Discharge Diagnoses:  Active Problems:   Pneumonia due to severe acute respiratory syndrome coronavirus 2 (SARS-CoV-2)  Discharge Instructions:  Activity:  As tolerated with Full fall precautions use walker/cane & assistance as needed  Discharge Instructions    Diet - low sodium heart healthy   Complete by:  As directed    vegeterian diet-dysphagia 3 diet   Increase activity slowly   Complete by:  As directed      Allergies as of 05/21/2019      Reactions   Meat [alpha-gal] Other (See Comments)   Food preference, Pt is a Vegetarian... Dietary restrictions only.  Not for Medication purposes      Medication List    TAKE these medications   albuterol 108 (90 Base) MCG/ACT inhaler Commonly known as:  VENTOLIN HFA Inhale 2 puffs into the lungs every 4 (four) hours as needed for wheezing or shortness of breath.   ascorbic acid 500 MG tablet Commonly known as:  VITAMIN C Take 1 tablet (500 mg total) by mouth daily for 7 days. Start taking on:  May 22, 2019   carvedilol 3.125 MG tablet Commonly known as:  COREG Take 1 tablet (3.125 mg total) by mouth 2 (two) times daily with a meal.   feeding supplement (ENSURE ENLIVE) Liqd Take 237 mLs by mouth  daily.   pantoprazole 40 MG tablet Commonly known as:  PROTONIX Take 1 tablet (40 mg total) by mouth daily. Start taking on:  May 22, 2019   zinc sulfate 220 (50 Zn) MG capsule Take 1 capsule (220 mg total) by mouth daily. Start taking on:  May 22, 2019       Allergies  Allergen Reactions   Meat  [Alpha-Gal] Other (See Comments)    Food preference, Pt is a Vegetarian... Dietary restrictions only.  Not for Medication purposes    Consultations:   None  Other Procedures/Studies: Ct Head Wo Contrast  Result Date: 05/12/2019 CLINICAL DATA:  85 year old male with failure to thrive, altered mental status. EXAM: CT HEAD WITHOUT CONTRAST TECHNIQUE: Contiguous axial images were obtained from the base of the skull through the vertex without intravenous contrast. COMPARISON:  Head CT 01/09/2018. FINDINGS: Brain: Study is intermittently degraded by motion artifact despite repeated imaging attempts. Fairly symmetric bilateral subdural hygromas or chronic hematomas have increased since 2019 most apparent on the coronal views where the subdural veins are more displaced from the inner table (coronal series 10, images 40 and 41). The collections measure 4-5 millimeters on the left and 3-4 millimeters on the right. There is no associated midline shift. Mild mass effect on both hemispheres. Basilar cisterns remain normal. No superimposed acute intracranial hemorrhage. Patchy and confluent bilateral cerebral white matter hypodensity and evidence of small chronic lacunar infarcts in the right basal ganglia and pons are stable. No ventriculomegaly. No cortically based acute infarct identified. Vascular: Calcified atherosclerosis at the skull base. No suspicious intracranial vascular hyperdensity. Skull: Mild motion artifact. No acute osseous abnormality identified. Sinuses/Orbits: Bubbly opacity in the bilateral maxillary sinuses and left sphenoid with small fluid levels. Chronic sinus mucoperiosteal thickening. The frontal sinuses today are clear. Tympanic cavities and mastoids are clear. Other: No acute orbit or scalp soft tissue findings. IMPRESSION: 1. 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 Mild associated mass effect on  both hemispheres with no midline shift. 2. No other acute intracranial abnormality. Chronic small vessel disease is stable since 2019. 3. Positive also for acute on chronic paranasal sinusitis. Electronically Signed   By: Odessa Fleming M.D.   On: 05/12/2019 19:47   Dg Chest Port 1 View  Result Date: 05/13/2019 CLINICAL DATA:  Shortness of breath EXAM: PORTABLE CHEST 1 VIEW COMPARISON:  05/12/2019 FINDINGS: There is bilateral diffuse mild interstitial thickening likely chronic. There is increased airspace disease in the right lower lobe and periphery of the left mid lung concerning for superimposed pneumonia. No pleural effusion or pneumothorax. The heart mediastinum are stable. The osseous structures are unremarkable. IMPRESSION: Mild bilateral chronic interstitial lung disease. Increased airspace disease in the right lower lobe and periphery of the left mid lung concerning for superimposed pneumonia. Electronically Signed   By: Elige Ko   On: 05/13/2019 09:39   Dg Chest Port 1 View  Result Date: 05/12/2019 CLINICAL DATA:  Positive COVID-19.  Failure to thrive. EXAM: PORTABLE CHEST 1 VIEW COMPARISON:  May 08, 2019 FINDINGS: There is patchy opacity in the right base. The lungs elsewhere are clear. Heart is upper normal in size with pulmonary vascularity normal. No adenopathy. There is aortic atherosclerosis. No bone lesions. IMPRESSION: Patchy infiltrate consistent with pneumonia right base. Lungs elsewhere clear. Heart upper normal in size. No adenopathy evident. Aortic Atherosclerosis (ICD10-I70.0). Electronically Signed   By: Bretta Bang III M.D.  On: 05/12/2019 19:03   Dg Chest Port 1 View  Result Date: 05/08/2019 CLINICAL DATA:  Fevers EXAM: PORTABLE CHEST 1 VIEW COMPARISON:  01/09/2018 FINDINGS: Cardiac shadow is stable. Aortic calcifications are noted. The lungs are well aerated bilaterally. No focal infiltrate is seen. Mild increased interstitial markings are noted stable from the prior exam.  This may represent mild edema. No bony abnormality is seen. IMPRESSION: Stable mild interstitial changes which may represent edema. Electronically Signed   By: Alcide Clever M.D.   On: 05/08/2019 14:44      TODAY-DAY OF DISCHARGE:  Subjective:   Martin Tanner today has no headache,no chest abdominal pain,no new weakness tingling or numbness, feels much better wants to go home today.   Objective:   Blood pressure (!) 141/98, pulse 67, temperature 98.2 F (36.8 C), temperature source Oral, resp. rate 18, height 5\' 3"  (1.6 m), weight 45.4 kg, SpO2 97 %.  Intake/Output Summary (Last 24 hours) at 05/21/2019 0949 Last data filed at 05/21/2019 0600 Gross per 24 hour  Intake 120 ml  Output 550 ml  Net -430 ml   Filed Weights   05/12/19 1631  Weight: 45.4 kg    Exam: Awake Alert, Oriented *3, No new F.N deficits, Normal affect [place].AT,PERRAL Supple Neck,No JVD, No cervical lymphadenopathy appriciated.  Symmetrical Chest wall movement, Good air movement bilaterally, CTAB RRR,No Gallops,Rubs or new Murmurs, No Parasternal Heave +ve B.Sounds, Abd Soft, Non tender, No organomegaly appriciated, No rebound -guarding or rigidity. No Cyanosis, Clubbing or edema, No new Rash or bruise   PERTINENT RADIOLOGIC STUDIES: Ct Head Wo Contrast  Result Date: 05/12/2019 CLINICAL DATA:  85 year old male with failure to thrive, altered mental status. EXAM: CT HEAD WITHOUT CONTRAST TECHNIQUE: Contiguous axial images were obtained from the base of the skull through the vertex without intravenous contrast. COMPARISON:  Head CT 01/09/2018. FINDINGS: Brain: Study is intermittently degraded by motion artifact despite repeated imaging attempts. Fairly symmetric bilateral subdural hygromas or chronic hematomas have increased since 2019 most apparent on the coronal views where the subdural veins are more displaced from the inner table (coronal series 10, images 40 and 41). The collections measure 4-5 millimeters on the left  and 3-4 millimeters on the right. There is no associated midline shift. Mild mass effect on both hemispheres. Basilar cisterns remain normal. No superimposed acute intracranial hemorrhage. Patchy and confluent bilateral cerebral white matter hypodensity and evidence of small chronic lacunar infarcts in the right basal ganglia and pons are stable. No ventriculomegaly. No cortically based acute infarct identified. Vascular: Calcified atherosclerosis at the skull base. No suspicious intracranial vascular hyperdensity. Skull: Mild motion artifact. No acute osseous abnormality identified. Sinuses/Orbits: Bubbly opacity in the bilateral maxillary sinuses and left sphenoid with small fluid levels. Chronic sinus mucoperiosteal thickening. The frontal sinuses today are clear. Tympanic cavities and mastoids are clear. Other: No acute orbit or scalp soft tissue findings. IMPRESSION: 1. 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 Mild associated mass effect on both hemispheres with no midline shift. 2. No other acute intracranial abnormality. Chronic small vessel disease is stable since 2019. 3. Positive also for acute on chronic paranasal sinusitis. Electronically Signed   By: Odessa Fleming M.D.   On: 05/12/2019 19:47   Dg Chest Port 1 View  Result Date: 05/13/2019 CLINICAL DATA:  Shortness of breath EXAM: PORTABLE CHEST 1 VIEW COMPARISON:  05/12/2019 FINDINGS: There is bilateral diffuse mild interstitial thickening likely  chronic. There is increased airspace disease in the right lower lobe and periphery of the left mid lung concerning for superimposed pneumonia. No pleural effusion or pneumothorax. The heart mediastinum are stable. The osseous structures are unremarkable. IMPRESSION: Mild bilateral chronic interstitial lung disease. Increased airspace disease in the right lower lobe and periphery of the left mid lung concerning for  superimposed pneumonia. Electronically Signed   By: Elige Ko   On: 05/13/2019 09:39   Dg Chest Port 1 View  Result Date: 05/12/2019 CLINICAL DATA:  Positive COVID-19.  Failure to thrive. EXAM: PORTABLE CHEST 1 VIEW COMPARISON:  May 08, 2019 FINDINGS: There is patchy opacity in the right base. The lungs elsewhere are clear. Heart is upper normal in size with pulmonary vascularity normal. No adenopathy. There is aortic atherosclerosis. No bone lesions. IMPRESSION: Patchy infiltrate consistent with pneumonia right base. Lungs elsewhere clear. Heart upper normal in size. No adenopathy evident. Aortic Atherosclerosis (ICD10-I70.0). Electronically Signed   By: Bretta Bang III M.D.   On: 05/12/2019 19:03   Dg Chest Port 1 View  Result Date: 05/08/2019 CLINICAL DATA:  Fevers EXAM: PORTABLE CHEST 1 VIEW COMPARISON:  01/09/2018 FINDINGS: Cardiac shadow is stable. Aortic calcifications are noted. The lungs are well aerated bilaterally. No focal infiltrate is seen. Mild increased interstitial markings are noted stable from the prior exam. This may represent mild edema. No bony abnormality is seen. IMPRESSION: Stable mild interstitial changes which may represent edema. Electronically Signed   By: Alcide Clever M.D.   On: 05/08/2019 14:44     PERTINENT LAB RESULTS: CBC: No results for input(s): WBC, HGB, HCT, PLT in the last 72 hours. CMET CMP     Component Value Date/Time   NA 141 05/17/2019 0532   K 3.3 (L) 05/17/2019 0532   CL 111 05/17/2019 0532   CO2 20 (L) 05/17/2019 0532   GLUCOSE 185 (H) 05/17/2019 0532   BUN 41 (H) 05/17/2019 0532   CREATININE 1.10 05/17/2019 0532   CREATININE 1.33 02/23/2014 1740   CALCIUM 8.4 (L) 05/17/2019 0532   PROT 6.3 (L) 05/17/2019 0532   ALBUMIN 2.4 (L) 05/17/2019 0532   AST 32 05/17/2019 0532   ALT 23 05/17/2019 0532   ALKPHOS 49 05/17/2019 0532   BILITOT 0.3 05/17/2019 0532   GFRNONAA 59 (L) 05/17/2019 0532   GFRNONAA 49 (L) 02/23/2014 1740   GFRAA  >60 05/17/2019 0532   GFRAA 56 (L) 02/23/2014 1740    GFR Estimated Creatinine Clearance: 29.2 mL/min (by C-G formula based on SCr of 1.1 mg/dL). No results for input(s): LIPASE, AMYLASE in the last 72 hours. No results for input(s): CKTOTAL, CKMB, CKMBINDEX, TROPONINI in the last 72 hours. Invalid input(s): POCBNP No results for input(s): DDIMER in the last 72 hours. No results for input(s): HGBA1C in the last 72 hours. No results for input(s): CHOL, HDL, LDLCALC, TRIG, CHOLHDL, LDLDIRECT in the last 72 hours. No results for input(s): TSH, T4TOTAL, T3FREE, THYROIDAB in the last 72 hours.  Invalid input(s): FREET3 No results for input(s): VITAMINB12, FOLATE, FERRITIN, TIBC, IRON, RETICCTPCT in the last 72 hours. Coags: No results for input(s): INR in the last 72 hours.  Invalid input(s): PT Microbiology: Recent Results (from the past 240 hour(s))  Culture, blood (Routine X 2) w Reflex to ID Panel     Status: Abnormal   Collection Time: 05/12/19  8:40 PM  Result Value Ref Range Status   Specimen Description BLOOD  Final   Special Requests   Final  BOTTLES DRAWN AEROBIC AND ANAEROBIC Blood Culture adequate volume   Culture  Setup Time   Final    GRAM POSITIVE COCCI IN CLUSTERS AEROBIC BOTTLE ONLY CRITICAL RESULT CALLED TO, READ BACK BY AND VERIFIED WITH: PHARMD N BATCHELDER [id number] AT 646AM BY CM    Culture (A)  Final    STAPHYLOCOCCUS SPECIES (COAGULASE NEGATIVE) THE SIGNIFICANCE OF ISOLATING THIS ORGANISM FROM A SINGLE SET OF BLOOD CULTURES WHEN MULTIPLE SETS ARE DRAWN IS UNCERTAIN. PLEASE NOTIFY THE MICROBIOLOGY DEPARTMENT WITHIN ONE WEEK IF SPECIATION AND SENSITIVITIES ARE REQUIRED.    Report Status 05/15/2019 FINAL  Final  Blood Culture ID Panel (Reflexed)     Status: None   Collection Time: 05/12/19  8:40 PM  Result Value Ref Range Status   Enterococcus species NOT DETECTED NOT DETECTED Final   Listeria monocytogenes NOT DETECTED NOT DETECTED Final   Staphylococcus  species NOT DETECTED NOT DETECTED Final   Staphylococcus aureus (BCID) NOT DETECTED NOT DETECTED Final   Streptococcus species NOT DETECTED NOT DETECTED Final   Streptococcus agalactiae NOT DETECTED NOT DETECTED Final   Streptococcus pneumoniae NOT DETECTED NOT DETECTED Final   Streptococcus pyogenes NOT DETECTED NOT DETECTED Final   Acinetobacter baumannii NOT DETECTED NOT DETECTED Final   Enterobacteriaceae species NOT DETECTED NOT DETECTED Final   Enterobacter cloacae complex NOT DETECTED NOT DETECTED Final   Escherichia coli NOT DETECTED NOT DETECTED Final   Klebsiella oxytoca NOT DETECTED NOT DETECTED Final   Klebsiella pneumoniae NOT DETECTED NOT DETECTED Final   Proteus species NOT DETECTED NOT DETECTED Final   Serratia marcescens NOT DETECTED NOT DETECTED Final   Haemophilus influenzae NOT DETECTED NOT DETECTED Final   Neisseria meningitidis NOT DETECTED NOT DETECTED Final   Pseudomonas aeruginosa NOT DETECTED NOT DETECTED Final   Candida albicans NOT DETECTED NOT DETECTED Final   Candida glabrata NOT DETECTED NOT DETECTED Final   Candida krusei NOT DETECTED NOT DETECTED Final   Candida parapsilosis NOT DETECTED NOT DETECTED Final   Candida tropicalis NOT DETECTED NOT DETECTED Final    Comment: Performed at [facility], [address], [place], [place] [address]  Culture, blood (Routine X 2) w Reflex to ID Panel     Status: None   Collection Time: 05/12/19  8:45 PM  Result Value Ref Range Status   Specimen Description   Final    BLOOD Performed at [facility], [address], [place], [place] [address]    Special Requests   Final    BOTTLES DRAWN AEROBIC AND ANAEROBIC Blood Culture adequate volume Performed at [facility], [address], [place], [place] [address]    Culture   Final    NO GROWTH 5 DAYS Performed at [facility], [address], [place], [place] [address]    Report Status 05/17/2019 FINAL  Final    Culture, Urine     Status: None   Collection Time: 05/12/19 11:45 PM  Result Value Ref Range Status   Specimen Description   Final    URINE, CLEAN CATCH Performed at [facility], [address] [place], [place] [address]    Special Requests   Final    NONE Performed at [facility], [address], [place], [place] [address]    Culture   Final    NO GROWTH Performed at [facility], [address] [place], [place] [address]    Report Status 05/14/2019 FINAL  Final  Novel Coronavirus, NAA (hospital order; send-out to ref lab)     Status: Abnormal   Collection Time: 05/19/19  3:08 PM  Result Value Ref Range Status   SARS-CoV-2, NAA DETECTED (A) NOT DETECTED Final    Comment: (NOTE) Testing was performed using the cobas(R) SARS-CoV-2 test. This test was developed and its performance characteristics determined by [employer]. This test has not been FDA cleared or approved. This test has been authorized by FDA under an Emergency Use Authorization (EUA). This test is only authorized for the duration of time the declaration that circumstances exist justifying the authorization of the emergency use of in vitro diagnostic tests for detection of SARS-CoV-2 virus and/or diagnosis of COVID-19 infection under section 564(b)(1) of the Act, 21 U.S.C. 161WRU-0(A)(5), unless the authorization is terminated or revoked sooner. When diagnostic testing is negative, the possibility of a false negative result should be considered in the context of a patient's recent exposures and the presence of clinical signs and symptoms consistent with COVID-19. An individual without symptoms of COVID-19 and who is not shedding SARS-CoV-2 virus would expect to have  a negative (not detected) result in this assay. Performed At: [facility] [address] [place], [place] [id number] Jolene Schimke MD NW:[id number]    Coronavirus Source  NASOPHARYNGEAL  Final    Comment: Performed at [facility], [address], [place], [place] [address]    FURTHER DISCHARGE INSTRUCTIONS:  Get Medicines reviewed and adjusted: Please take all your medications with you for your next visit with your Primary MD  Laboratory/radiological data: Please request your Primary MD to go over all hospital tests and procedure/radiological results at the follow up, please ask your Primary MD to get all Hospital records sent to his/her office.  In some cases, they will be blood work, cultures and biopsy results pending at the time of your discharge. Please request that your primary care M.D. goes through all the records of your hospital data and follows up on these results.  Also Note the following: If you experience worsening of your admission symptoms, develop shortness of breath, life threatening emergency, suicidal or homicidal thoughts you must seek medical attention immediately by calling 911 or calling your MD immediately  if symptoms less severe.  You must read complete instructions/literature along with all the possible adverse reactions/side effects for all the Medicines you take and that have been prescribed to you. Take any new Medicines after you have completely understood and accpet all the possible adverse reactions/side effects.   Do not drive when taking Pain medications or sleeping medications (Benzodaizepines)  Do not take more than prescribed Pain, Sleep and Anxiety Medications. It is not advisable to combine anxiety,sleep and pain medications without talking with your primary care practitioner  Special Instructions: If you have smoked or chewed Tobacco  in the last 2 yrs please stop smoking, stop any regular Alcohol  and or any Recreational drug use.  Wear Seat belts while driving.  Please note: You were cared for by a hospitalist during your hospital stay. Once you are discharged, your primary care physician will  handle any further medical issues. Please note that NO REFILLS for any discharge medications will be authorized once you are discharged, as it is imperative that you return to your primary care physician (or establish a relationship with a primary care physician if you do not have one) for your post hospital discharge needs so that they can reassess your need for medications and monitor your lab  values.  Total Time spent coordinating discharge including counseling, education and face to face time equals 35 minutes.  SignedJeoffrey Massed: Kavita Bartl 05/21/2019 9:49 AM

## 2019-05-21 NOTE — Progress Notes (Signed)
Report given to [facility] at facility.

## 2019-05-22 DIAGNOSIS — R5381 Other malaise: Secondary | ICD-10-CM | POA: Diagnosis not present

## 2019-05-22 DIAGNOSIS — U071 COVID-19: Secondary | ICD-10-CM | POA: Diagnosis not present

## 2019-05-22 DIAGNOSIS — H539 Unspecified visual disturbance: Secondary | ICD-10-CM | POA: Diagnosis not present

## 2019-05-23 DIAGNOSIS — H539 Unspecified visual disturbance: Secondary | ICD-10-CM | POA: Diagnosis not present

## 2019-05-24 DIAGNOSIS — H539 Unspecified visual disturbance: Secondary | ICD-10-CM | POA: Diagnosis not present

## 2019-05-25 DIAGNOSIS — R5381 Other malaise: Secondary | ICD-10-CM | POA: Diagnosis not present

## 2019-05-25 DIAGNOSIS — U071 COVID-19: Secondary | ICD-10-CM | POA: Diagnosis not present

## 2019-05-25 DIAGNOSIS — I10 Essential (primary) hypertension: Secondary | ICD-10-CM | POA: Diagnosis not present

## 2019-05-28 DIAGNOSIS — R5381 Other malaise: Secondary | ICD-10-CM | POA: Diagnosis not present

## 2019-05-28 DIAGNOSIS — U071 COVID-19: Secondary | ICD-10-CM | POA: Diagnosis not present

## 2019-06-01 DIAGNOSIS — R5381 Other malaise: Secondary | ICD-10-CM | POA: Diagnosis not present

## 2019-06-01 DIAGNOSIS — U071 COVID-19: Secondary | ICD-10-CM | POA: Diagnosis not present

## 2019-06-04 DIAGNOSIS — R5381 Other malaise: Secondary | ICD-10-CM | POA: Diagnosis not present

## 2019-06-04 DIAGNOSIS — U071 COVID-19: Secondary | ICD-10-CM | POA: Diagnosis not present

## 2019-06-11 DIAGNOSIS — U071 COVID-19: Secondary | ICD-10-CM | POA: Diagnosis not present

## 2019-06-11 DIAGNOSIS — R5381 Other malaise: Secondary | ICD-10-CM | POA: Diagnosis not present

## 2019-06-15 DIAGNOSIS — H539 Unspecified visual disturbance: Secondary | ICD-10-CM | POA: Diagnosis not present

## 2019-06-16 DIAGNOSIS — H539 Unspecified visual disturbance: Secondary | ICD-10-CM | POA: Diagnosis not present

## 2019-06-17 DIAGNOSIS — H539 Unspecified visual disturbance: Secondary | ICD-10-CM | POA: Diagnosis not present

## 2019-06-18 DIAGNOSIS — H539 Unspecified visual disturbance: Secondary | ICD-10-CM | POA: Diagnosis not present

## 2019-06-19 DIAGNOSIS — H539 Unspecified visual disturbance: Secondary | ICD-10-CM | POA: Diagnosis not present

## 2019-06-20 DIAGNOSIS — H539 Unspecified visual disturbance: Secondary | ICD-10-CM | POA: Diagnosis not present

## 2019-06-21 DIAGNOSIS — H539 Unspecified visual disturbance: Secondary | ICD-10-CM | POA: Diagnosis not present

## 2019-06-22 DIAGNOSIS — H539 Unspecified visual disturbance: Secondary | ICD-10-CM | POA: Diagnosis not present

## 2019-06-23 DIAGNOSIS — H539 Unspecified visual disturbance: Secondary | ICD-10-CM | POA: Diagnosis not present

## 2019-06-24 DIAGNOSIS — H539 Unspecified visual disturbance: Secondary | ICD-10-CM | POA: Diagnosis not present

## 2019-06-25 DIAGNOSIS — H539 Unspecified visual disturbance: Secondary | ICD-10-CM | POA: Diagnosis not present

## 2019-06-26 DIAGNOSIS — H539 Unspecified visual disturbance: Secondary | ICD-10-CM | POA: Diagnosis not present

## 2019-06-27 DIAGNOSIS — H539 Unspecified visual disturbance: Secondary | ICD-10-CM | POA: Diagnosis not present

## 2019-06-28 DIAGNOSIS — H539 Unspecified visual disturbance: Secondary | ICD-10-CM | POA: Diagnosis not present

## 2019-06-29 DIAGNOSIS — H539 Unspecified visual disturbance: Secondary | ICD-10-CM | POA: Diagnosis not present

## 2019-06-30 DIAGNOSIS — H539 Unspecified visual disturbance: Secondary | ICD-10-CM | POA: Diagnosis not present

## 2019-07-01 DIAGNOSIS — H539 Unspecified visual disturbance: Secondary | ICD-10-CM | POA: Diagnosis not present

## 2019-07-02 DIAGNOSIS — H539 Unspecified visual disturbance: Secondary | ICD-10-CM | POA: Diagnosis not present

## 2019-07-03 DIAGNOSIS — H539 Unspecified visual disturbance: Secondary | ICD-10-CM | POA: Diagnosis not present

## 2019-07-04 DIAGNOSIS — H539 Unspecified visual disturbance: Secondary | ICD-10-CM | POA: Diagnosis not present

## 2019-07-05 DIAGNOSIS — H539 Unspecified visual disturbance: Secondary | ICD-10-CM | POA: Diagnosis not present

## 2019-07-06 DIAGNOSIS — H539 Unspecified visual disturbance: Secondary | ICD-10-CM | POA: Diagnosis not present

## 2019-07-07 DIAGNOSIS — H539 Unspecified visual disturbance: Secondary | ICD-10-CM | POA: Diagnosis not present

## 2019-07-08 DIAGNOSIS — H539 Unspecified visual disturbance: Secondary | ICD-10-CM | POA: Diagnosis not present

## 2019-07-09 DIAGNOSIS — H539 Unspecified visual disturbance: Secondary | ICD-10-CM | POA: Diagnosis not present

## 2019-07-10 DIAGNOSIS — H539 Unspecified visual disturbance: Secondary | ICD-10-CM | POA: Diagnosis not present

## 2019-07-11 DIAGNOSIS — H539 Unspecified visual disturbance: Secondary | ICD-10-CM | POA: Diagnosis not present

## 2019-07-12 DIAGNOSIS — H539 Unspecified visual disturbance: Secondary | ICD-10-CM | POA: Diagnosis not present

## 2019-07-13 DIAGNOSIS — H539 Unspecified visual disturbance: Secondary | ICD-10-CM | POA: Diagnosis not present

## 2019-07-14 DIAGNOSIS — H539 Unspecified visual disturbance: Secondary | ICD-10-CM | POA: Diagnosis not present

## 2019-07-15 DIAGNOSIS — H539 Unspecified visual disturbance: Secondary | ICD-10-CM | POA: Diagnosis not present

## 2019-07-16 DIAGNOSIS — H539 Unspecified visual disturbance: Secondary | ICD-10-CM | POA: Diagnosis not present

## 2019-07-17 DIAGNOSIS — H539 Unspecified visual disturbance: Secondary | ICD-10-CM | POA: Diagnosis not present

## 2019-07-18 DIAGNOSIS — H539 Unspecified visual disturbance: Secondary | ICD-10-CM | POA: Diagnosis not present

## 2019-07-19 DIAGNOSIS — H539 Unspecified visual disturbance: Secondary | ICD-10-CM | POA: Diagnosis not present

## 2019-07-20 DIAGNOSIS — H539 Unspecified visual disturbance: Secondary | ICD-10-CM | POA: Diagnosis not present

## 2019-07-21 DIAGNOSIS — H539 Unspecified visual disturbance: Secondary | ICD-10-CM | POA: Diagnosis not present

## 2019-07-22 DIAGNOSIS — H539 Unspecified visual disturbance: Secondary | ICD-10-CM | POA: Diagnosis not present

## 2019-07-23 DIAGNOSIS — H539 Unspecified visual disturbance: Secondary | ICD-10-CM | POA: Diagnosis not present

## 2019-07-24 DIAGNOSIS — H539 Unspecified visual disturbance: Secondary | ICD-10-CM | POA: Diagnosis not present

## 2019-07-25 DIAGNOSIS — H539 Unspecified visual disturbance: Secondary | ICD-10-CM | POA: Diagnosis not present

## 2019-07-26 DIAGNOSIS — H539 Unspecified visual disturbance: Secondary | ICD-10-CM | POA: Diagnosis not present

## 2019-07-27 DIAGNOSIS — H539 Unspecified visual disturbance: Secondary | ICD-10-CM | POA: Diagnosis not present

## 2019-07-28 DIAGNOSIS — H539 Unspecified visual disturbance: Secondary | ICD-10-CM | POA: Diagnosis not present

## 2019-07-29 DIAGNOSIS — H539 Unspecified visual disturbance: Secondary | ICD-10-CM | POA: Diagnosis not present

## 2019-07-30 DIAGNOSIS — H539 Unspecified visual disturbance: Secondary | ICD-10-CM | POA: Diagnosis not present

## 2019-07-31 DIAGNOSIS — H539 Unspecified visual disturbance: Secondary | ICD-10-CM | POA: Diagnosis not present

## 2019-08-01 DIAGNOSIS — H539 Unspecified visual disturbance: Secondary | ICD-10-CM | POA: Diagnosis not present

## 2019-08-02 DIAGNOSIS — H539 Unspecified visual disturbance: Secondary | ICD-10-CM | POA: Diagnosis not present

## 2019-08-03 DIAGNOSIS — H539 Unspecified visual disturbance: Secondary | ICD-10-CM | POA: Diagnosis not present

## 2019-08-04 DIAGNOSIS — H539 Unspecified visual disturbance: Secondary | ICD-10-CM | POA: Diagnosis not present

## 2019-08-05 DIAGNOSIS — H539 Unspecified visual disturbance: Secondary | ICD-10-CM | POA: Diagnosis not present

## 2019-08-06 DIAGNOSIS — H539 Unspecified visual disturbance: Secondary | ICD-10-CM | POA: Diagnosis not present

## 2019-08-07 DIAGNOSIS — H539 Unspecified visual disturbance: Secondary | ICD-10-CM | POA: Diagnosis not present

## 2019-08-08 DIAGNOSIS — H539 Unspecified visual disturbance: Secondary | ICD-10-CM | POA: Diagnosis not present

## 2019-08-09 DIAGNOSIS — H539 Unspecified visual disturbance: Secondary | ICD-10-CM | POA: Diagnosis not present

## 2019-08-10 DIAGNOSIS — H539 Unspecified visual disturbance: Secondary | ICD-10-CM | POA: Diagnosis not present

## 2019-08-11 DIAGNOSIS — H539 Unspecified visual disturbance: Secondary | ICD-10-CM | POA: Diagnosis not present

## 2019-08-12 DIAGNOSIS — H539 Unspecified visual disturbance: Secondary | ICD-10-CM | POA: Diagnosis not present

## 2019-08-13 DIAGNOSIS — H539 Unspecified visual disturbance: Secondary | ICD-10-CM | POA: Diagnosis not present

## 2019-08-14 DIAGNOSIS — H539 Unspecified visual disturbance: Secondary | ICD-10-CM | POA: Diagnosis not present

## 2019-08-15 DIAGNOSIS — H539 Unspecified visual disturbance: Secondary | ICD-10-CM | POA: Diagnosis not present

## 2019-08-16 DIAGNOSIS — H539 Unspecified visual disturbance: Secondary | ICD-10-CM | POA: Diagnosis not present

## 2019-08-17 DIAGNOSIS — H539 Unspecified visual disturbance: Secondary | ICD-10-CM | POA: Diagnosis not present

## 2019-08-18 DIAGNOSIS — H539 Unspecified visual disturbance: Secondary | ICD-10-CM | POA: Diagnosis not present

## 2019-08-19 DIAGNOSIS — H539 Unspecified visual disturbance: Secondary | ICD-10-CM | POA: Diagnosis not present

## 2019-08-20 DIAGNOSIS — H539 Unspecified visual disturbance: Secondary | ICD-10-CM | POA: Diagnosis not present

## 2019-08-21 DIAGNOSIS — H539 Unspecified visual disturbance: Secondary | ICD-10-CM | POA: Diagnosis not present

## 2019-08-22 DIAGNOSIS — H539 Unspecified visual disturbance: Secondary | ICD-10-CM | POA: Diagnosis not present

## 2019-08-23 DIAGNOSIS — H539 Unspecified visual disturbance: Secondary | ICD-10-CM | POA: Diagnosis not present

## 2019-08-24 DIAGNOSIS — H539 Unspecified visual disturbance: Secondary | ICD-10-CM | POA: Diagnosis not present

## 2019-08-25 DIAGNOSIS — H539 Unspecified visual disturbance: Secondary | ICD-10-CM | POA: Diagnosis not present

## 2019-08-26 DIAGNOSIS — H539 Unspecified visual disturbance: Secondary | ICD-10-CM | POA: Diagnosis not present

## 2019-08-27 DIAGNOSIS — H539 Unspecified visual disturbance: Secondary | ICD-10-CM | POA: Diagnosis not present

## 2019-08-28 DIAGNOSIS — H539 Unspecified visual disturbance: Secondary | ICD-10-CM | POA: Diagnosis not present

## 2019-08-29 DIAGNOSIS — H539 Unspecified visual disturbance: Secondary | ICD-10-CM | POA: Diagnosis not present

## 2019-08-30 DIAGNOSIS — H539 Unspecified visual disturbance: Secondary | ICD-10-CM | POA: Diagnosis not present

## 2019-08-31 DIAGNOSIS — H539 Unspecified visual disturbance: Secondary | ICD-10-CM | POA: Diagnosis not present

## 2019-09-01 DIAGNOSIS — H539 Unspecified visual disturbance: Secondary | ICD-10-CM | POA: Diagnosis not present

## 2019-09-02 DIAGNOSIS — H539 Unspecified visual disturbance: Secondary | ICD-10-CM | POA: Diagnosis not present

## 2019-09-03 DIAGNOSIS — H539 Unspecified visual disturbance: Secondary | ICD-10-CM | POA: Diagnosis not present

## 2019-09-04 DIAGNOSIS — H539 Unspecified visual disturbance: Secondary | ICD-10-CM | POA: Diagnosis not present

## 2019-09-05 DIAGNOSIS — H539 Unspecified visual disturbance: Secondary | ICD-10-CM | POA: Diagnosis not present

## 2019-09-06 DIAGNOSIS — H539 Unspecified visual disturbance: Secondary | ICD-10-CM | POA: Diagnosis not present

## 2019-09-07 DIAGNOSIS — H539 Unspecified visual disturbance: Secondary | ICD-10-CM | POA: Diagnosis not present

## 2019-09-08 DIAGNOSIS — H539 Unspecified visual disturbance: Secondary | ICD-10-CM | POA: Diagnosis not present

## 2019-09-09 DIAGNOSIS — H539 Unspecified visual disturbance: Secondary | ICD-10-CM | POA: Diagnosis not present

## 2019-09-10 DIAGNOSIS — H539 Unspecified visual disturbance: Secondary | ICD-10-CM | POA: Diagnosis not present

## 2019-09-11 DIAGNOSIS — H539 Unspecified visual disturbance: Secondary | ICD-10-CM | POA: Diagnosis not present

## 2019-09-12 DIAGNOSIS — H539 Unspecified visual disturbance: Secondary | ICD-10-CM | POA: Diagnosis not present

## 2019-09-13 DIAGNOSIS — H539 Unspecified visual disturbance: Secondary | ICD-10-CM | POA: Diagnosis not present

## 2019-09-14 DIAGNOSIS — H539 Unspecified visual disturbance: Secondary | ICD-10-CM | POA: Diagnosis not present

## 2019-09-15 DIAGNOSIS — H539 Unspecified visual disturbance: Secondary | ICD-10-CM | POA: Diagnosis not present

## 2019-09-16 DIAGNOSIS — H539 Unspecified visual disturbance: Secondary | ICD-10-CM | POA: Diagnosis not present

## 2019-09-17 DIAGNOSIS — H539 Unspecified visual disturbance: Secondary | ICD-10-CM | POA: Diagnosis not present

## 2019-09-18 DIAGNOSIS — H539 Unspecified visual disturbance: Secondary | ICD-10-CM | POA: Diagnosis not present

## 2019-09-19 DIAGNOSIS — H539 Unspecified visual disturbance: Secondary | ICD-10-CM | POA: Diagnosis not present

## 2019-09-20 DIAGNOSIS — H539 Unspecified visual disturbance: Secondary | ICD-10-CM | POA: Diagnosis not present

## 2019-09-21 DIAGNOSIS — H539 Unspecified visual disturbance: Secondary | ICD-10-CM | POA: Diagnosis not present

## 2019-09-22 DIAGNOSIS — H539 Unspecified visual disturbance: Secondary | ICD-10-CM | POA: Diagnosis not present

## 2019-09-23 DIAGNOSIS — H539 Unspecified visual disturbance: Secondary | ICD-10-CM | POA: Diagnosis not present

## 2019-09-24 DIAGNOSIS — H539 Unspecified visual disturbance: Secondary | ICD-10-CM | POA: Diagnosis not present

## 2019-09-25 DIAGNOSIS — H539 Unspecified visual disturbance: Secondary | ICD-10-CM | POA: Diagnosis not present

## 2019-09-26 DIAGNOSIS — H539 Unspecified visual disturbance: Secondary | ICD-10-CM | POA: Diagnosis not present

## 2019-09-27 DIAGNOSIS — H539 Unspecified visual disturbance: Secondary | ICD-10-CM | POA: Diagnosis not present

## 2019-09-28 DIAGNOSIS — H539 Unspecified visual disturbance: Secondary | ICD-10-CM | POA: Diagnosis not present

## 2019-09-29 DIAGNOSIS — H539 Unspecified visual disturbance: Secondary | ICD-10-CM | POA: Diagnosis not present

## 2019-09-30 DIAGNOSIS — H539 Unspecified visual disturbance: Secondary | ICD-10-CM | POA: Diagnosis not present

## 2019-10-01 DIAGNOSIS — H539 Unspecified visual disturbance: Secondary | ICD-10-CM | POA: Diagnosis not present

## 2019-10-02 DIAGNOSIS — H539 Unspecified visual disturbance: Secondary | ICD-10-CM | POA: Diagnosis not present

## 2019-10-03 DIAGNOSIS — H539 Unspecified visual disturbance: Secondary | ICD-10-CM | POA: Diagnosis not present

## 2019-10-04 DIAGNOSIS — H539 Unspecified visual disturbance: Secondary | ICD-10-CM | POA: Diagnosis not present

## 2019-10-05 DIAGNOSIS — H539 Unspecified visual disturbance: Secondary | ICD-10-CM | POA: Diagnosis not present

## 2019-10-06 DIAGNOSIS — H539 Unspecified visual disturbance: Secondary | ICD-10-CM | POA: Diagnosis not present

## 2019-10-07 DIAGNOSIS — H539 Unspecified visual disturbance: Secondary | ICD-10-CM | POA: Diagnosis not present

## 2019-10-08 DIAGNOSIS — H539 Unspecified visual disturbance: Secondary | ICD-10-CM | POA: Diagnosis not present

## 2019-10-09 DIAGNOSIS — H539 Unspecified visual disturbance: Secondary | ICD-10-CM | POA: Diagnosis not present

## 2019-10-10 DIAGNOSIS — H539 Unspecified visual disturbance: Secondary | ICD-10-CM | POA: Diagnosis not present

## 2019-10-11 DIAGNOSIS — H539 Unspecified visual disturbance: Secondary | ICD-10-CM | POA: Diagnosis not present

## 2019-10-12 DIAGNOSIS — H539 Unspecified visual disturbance: Secondary | ICD-10-CM | POA: Diagnosis not present

## 2019-10-13 DIAGNOSIS — H539 Unspecified visual disturbance: Secondary | ICD-10-CM | POA: Diagnosis not present

## 2019-10-14 DIAGNOSIS — H539 Unspecified visual disturbance: Secondary | ICD-10-CM | POA: Diagnosis not present

## 2019-10-15 DIAGNOSIS — H539 Unspecified visual disturbance: Secondary | ICD-10-CM | POA: Diagnosis not present

## 2019-10-16 DIAGNOSIS — H539 Unspecified visual disturbance: Secondary | ICD-10-CM | POA: Diagnosis not present

## 2019-10-17 DIAGNOSIS — H539 Unspecified visual disturbance: Secondary | ICD-10-CM | POA: Diagnosis not present

## 2019-10-18 DIAGNOSIS — H539 Unspecified visual disturbance: Secondary | ICD-10-CM | POA: Diagnosis not present

## 2019-10-19 DIAGNOSIS — H539 Unspecified visual disturbance: Secondary | ICD-10-CM | POA: Diagnosis not present

## 2019-10-20 DIAGNOSIS — H539 Unspecified visual disturbance: Secondary | ICD-10-CM | POA: Diagnosis not present

## 2019-10-21 DIAGNOSIS — H539 Unspecified visual disturbance: Secondary | ICD-10-CM | POA: Diagnosis not present

## 2019-10-22 DIAGNOSIS — H539 Unspecified visual disturbance: Secondary | ICD-10-CM | POA: Diagnosis not present

## 2019-10-23 DIAGNOSIS — H539 Unspecified visual disturbance: Secondary | ICD-10-CM | POA: Diagnosis not present

## 2019-10-24 DIAGNOSIS — H539 Unspecified visual disturbance: Secondary | ICD-10-CM | POA: Diagnosis not present

## 2019-10-25 DIAGNOSIS — H539 Unspecified visual disturbance: Secondary | ICD-10-CM | POA: Diagnosis not present

## 2019-10-26 DIAGNOSIS — H539 Unspecified visual disturbance: Secondary | ICD-10-CM | POA: Diagnosis not present

## 2019-10-27 DIAGNOSIS — H539 Unspecified visual disturbance: Secondary | ICD-10-CM | POA: Diagnosis not present

## 2019-10-28 DIAGNOSIS — H539 Unspecified visual disturbance: Secondary | ICD-10-CM | POA: Diagnosis not present

## 2019-10-29 DIAGNOSIS — H539 Unspecified visual disturbance: Secondary | ICD-10-CM | POA: Diagnosis not present

## 2019-10-30 DIAGNOSIS — H539 Unspecified visual disturbance: Secondary | ICD-10-CM | POA: Diagnosis not present

## 2019-10-31 DIAGNOSIS — H539 Unspecified visual disturbance: Secondary | ICD-10-CM | POA: Diagnosis not present

## 2019-11-01 DIAGNOSIS — H539 Unspecified visual disturbance: Secondary | ICD-10-CM | POA: Diagnosis not present

## 2019-11-09 DIAGNOSIS — H539 Unspecified visual disturbance: Secondary | ICD-10-CM | POA: Diagnosis not present

## 2019-11-10 DIAGNOSIS — H539 Unspecified visual disturbance: Secondary | ICD-10-CM | POA: Diagnosis not present

## 2019-11-11 DIAGNOSIS — H539 Unspecified visual disturbance: Secondary | ICD-10-CM | POA: Diagnosis not present

## 2019-11-12 DIAGNOSIS — H539 Unspecified visual disturbance: Secondary | ICD-10-CM | POA: Diagnosis not present

## 2019-11-13 DIAGNOSIS — H539 Unspecified visual disturbance: Secondary | ICD-10-CM | POA: Diagnosis not present

## 2019-11-14 DIAGNOSIS — H539 Unspecified visual disturbance: Secondary | ICD-10-CM | POA: Diagnosis not present

## 2019-11-15 DIAGNOSIS — H539 Unspecified visual disturbance: Secondary | ICD-10-CM | POA: Diagnosis not present

## 2019-11-16 DIAGNOSIS — H539 Unspecified visual disturbance: Secondary | ICD-10-CM | POA: Diagnosis not present

## 2019-11-17 DIAGNOSIS — H539 Unspecified visual disturbance: Secondary | ICD-10-CM | POA: Diagnosis not present

## 2019-11-18 DIAGNOSIS — H539 Unspecified visual disturbance: Secondary | ICD-10-CM | POA: Diagnosis not present

## 2019-11-19 DIAGNOSIS — H539 Unspecified visual disturbance: Secondary | ICD-10-CM | POA: Diagnosis not present

## 2019-11-20 DIAGNOSIS — H539 Unspecified visual disturbance: Secondary | ICD-10-CM | POA: Diagnosis not present

## 2019-11-21 DIAGNOSIS — H539 Unspecified visual disturbance: Secondary | ICD-10-CM | POA: Diagnosis not present

## 2019-11-22 DIAGNOSIS — H539 Unspecified visual disturbance: Secondary | ICD-10-CM | POA: Diagnosis not present

## 2019-11-23 DIAGNOSIS — H539 Unspecified visual disturbance: Secondary | ICD-10-CM | POA: Diagnosis not present

## 2019-11-24 DIAGNOSIS — H539 Unspecified visual disturbance: Secondary | ICD-10-CM | POA: Diagnosis not present

## 2019-11-25 DIAGNOSIS — H539 Unspecified visual disturbance: Secondary | ICD-10-CM | POA: Diagnosis not present

## 2019-11-26 DIAGNOSIS — H539 Unspecified visual disturbance: Secondary | ICD-10-CM | POA: Diagnosis not present

## 2019-11-27 DIAGNOSIS — H539 Unspecified visual disturbance: Secondary | ICD-10-CM | POA: Diagnosis not present

## 2019-11-28 DIAGNOSIS — H539 Unspecified visual disturbance: Secondary | ICD-10-CM | POA: Diagnosis not present

## 2019-11-29 DIAGNOSIS — H539 Unspecified visual disturbance: Secondary | ICD-10-CM | POA: Diagnosis not present

## 2019-11-30 ENCOUNTER — Encounter: Payer: Self-pay | Admitting: Internal Medicine

## 2019-11-30 ENCOUNTER — Ambulatory Visit: Payer: Medicaid Other | Attending: Internal Medicine | Admitting: Internal Medicine

## 2019-11-30 ENCOUNTER — Other Ambulatory Visit: Payer: Self-pay

## 2019-11-30 VITALS — BP 172/111 | HR 88 | Temp 98.5°F | Resp 16 | Ht 60.0 in | Wt 101.2 lb

## 2019-11-30 DIAGNOSIS — E538 Deficiency of other specified B group vitamins: Secondary | ICD-10-CM | POA: Diagnosis not present

## 2019-11-30 DIAGNOSIS — R159 Full incontinence of feces: Secondary | ICD-10-CM | POA: Diagnosis not present

## 2019-11-30 DIAGNOSIS — Z8619 Personal history of other infectious and parasitic diseases: Secondary | ICD-10-CM | POA: Diagnosis not present

## 2019-11-30 DIAGNOSIS — H539 Unspecified visual disturbance: Secondary | ICD-10-CM | POA: Diagnosis not present

## 2019-11-30 DIAGNOSIS — R3981 Functional urinary incontinence: Secondary | ICD-10-CM

## 2019-11-30 DIAGNOSIS — K219 Gastro-esophageal reflux disease without esophagitis: Secondary | ICD-10-CM | POA: Diagnosis not present

## 2019-11-30 DIAGNOSIS — Z9181 History of falling: Secondary | ICD-10-CM

## 2019-11-30 DIAGNOSIS — H9193 Unspecified hearing loss, bilateral: Secondary | ICD-10-CM

## 2019-11-30 DIAGNOSIS — I129 Hypertensive chronic kidney disease with stage 1 through stage 4 chronic kidney disease, or unspecified chronic kidney disease: Secondary | ICD-10-CM | POA: Insufficient documentation

## 2019-11-30 DIAGNOSIS — Z23 Encounter for immunization: Secondary | ICD-10-CM | POA: Diagnosis not present

## 2019-11-30 DIAGNOSIS — R739 Hyperglycemia, unspecified: Secondary | ICD-10-CM

## 2019-11-30 DIAGNOSIS — E559 Vitamin D deficiency, unspecified: Secondary | ICD-10-CM | POA: Diagnosis not present

## 2019-11-30 DIAGNOSIS — Z91018 Allergy to other foods: Secondary | ICD-10-CM | POA: Insufficient documentation

## 2019-11-30 DIAGNOSIS — I10 Essential (primary) hypertension: Secondary | ICD-10-CM | POA: Diagnosis not present

## 2019-11-30 DIAGNOSIS — F17210 Nicotine dependence, cigarettes, uncomplicated: Secondary | ICD-10-CM | POA: Diagnosis not present

## 2019-11-30 DIAGNOSIS — F0390 Unspecified dementia without behavioral disturbance: Secondary | ICD-10-CM | POA: Diagnosis not present

## 2019-11-30 DIAGNOSIS — D631 Anemia in chronic kidney disease: Secondary | ICD-10-CM | POA: Insufficient documentation

## 2019-11-30 DIAGNOSIS — N189 Chronic kidney disease, unspecified: Secondary | ICD-10-CM | POA: Diagnosis not present

## 2019-11-30 DIAGNOSIS — H547 Unspecified visual loss: Secondary | ICD-10-CM

## 2019-11-30 DIAGNOSIS — H538 Other visual disturbances: Secondary | ICD-10-CM | POA: Diagnosis not present

## 2019-11-30 DIAGNOSIS — R627 Adult failure to thrive: Secondary | ICD-10-CM | POA: Insufficient documentation

## 2019-11-30 MED ORDER — ENSURE ENLIVE PO LIQD: 237.0000 mL | ORAL | 12 refills | Status: DC

## 2019-11-30 MED ORDER — OMEPRAZOLE 20 MG PO CPDR: 20.0000 mg | DELAYED_RELEASE_CAPSULE | Freq: Every day | ORAL | 3 refills | Status: DC

## 2019-11-30 MED ORDER — AMLODIPINE BESYLATE 5 MG PO TABS: 5.0000 mg | ORAL_TABLET | Freq: Every day | ORAL | 3 refills | Status: DC

## 2019-11-30 NOTE — Progress Notes (Addendum)
Patient ID: Martin Tanner, male    DOB: 17-Dec-1928  MRN: [id number]  CC: New Patient (Initial Visit)   Subjective: Jack Zingaro is a 85 y.o. male who presents for new pt visit.  Cruzito, Standre and GS's wife Jefferson Majhi are with him His concerns today include:  Pt with hx of CKD, ACD, COVID pneumonia 04/2019, pancytopenia, HTN, demenia  No previous PCP.  Patient has dementia and is not able to give history and has difficult time answering questions.  Yolanda Bonine tells me that he has decreased hearing and poor vision.  Daughter-in-law thinks he had surgery on his eyes sometime last year but they are not sure for what.   pt lives with grandson Pt currently not on any med.  I see that patient was hospitalized in late spring for COVID-19 infection and was discharged on carvedilol Occasional HA. Last c/o HA yesterday and was given Tylenol -+dizziness and weakness at times. Last was 2 days ago. pt has cane and walker.  Uses walker more. Fell yesterday while trying to get up from bed to use restroom.  He does not reliably call for help but needs help with transfers. Pt feeds himself.  Poor appetite which is not new.  They give him Ensure shakes when they are able to get it for him.  Needs help with baths and clothing himself -pt incontinent a times of both urine and feces.  Wears Depends Care giver comes 2 hrs a day. Grandson works night (4:30 p.m to 6 a.m) and his wife works 7 a.m to 4 p.m.  Caregiver comes at 8 a.m  No cough or fever.   No diarrhea a this time.  -+ heartburn with spicy foods.  Pt likes spicy foods and tomatoe soups  Patient Active Problem List   Diagnosis Date Noted  . Pneumonia due to severe acute respiratory syndrome coronavirus 2 (SARS-CoV-2) 05/12/2019  . Community acquired pneumonia 01/09/2018  . Nausea and vomiting 01/09/2018  . Pneumonia 01/09/2018  . Anemia due to chronic kidney disease 11/18/2017  . CKD (chronic kidney disease) 11/18/2017  . Hemangiectasia  11/18/2017  . Hematemesis 11/17/2017  . Hyponatremia 04/01/2013  . Pyelonephritis 01/28/2013  . Metabolic acidosis 77/82/4235  . Thrombocytopenia ([place]) 01/28/2013  . UTI (lower urinary tract infection) 01/25/2013  . Urinary retention 01/25/2013  . Anemia 01/25/2013  . Weakness generalized 01/25/2013     No current outpatient medications on file prior to visit.   No current facility-administered medications on file prior to visit.     Allergies  Allergen Reactions  . Meat [Alpha-Gal] Other (See Comments)    Food preference, Pt is a Vegetarian... Dietary restrictions only.  Not for Medication purposes    Social History   Socioeconomic History  . Marital status: Widowed    Spouse name: Not on file  . Number of children: Not on file  . Years of education: Not on file  . Highest education level: Not on file  Occupational History  . Not on file  Social Needs  . Financial resource strain: Not on file  . Food insecurity    Worry: Not on file    Inability: Not on file  . Transportation needs    Medical: Not on file    Non-medical: Not on file  Tobacco Use  . Smoking status: Current Every Day Smoker    Packs/day: 0.25    Years: 62.00    Pack years: 15.50    Types: Cigarettes  .  Smokeless tobacco: Never Used  Substance and Sexual Activity  . Alcohol use: No    Alcohol/week: 0.0 standard drinks  . Drug use: No  . Sexual activity: Never  Lifestyle  . Physical activity    Days per week: Not on file    Minutes per session: Not on file  . Stress: Not on file  Relationships  . Social Musician on phone: Not on file    Gets together: Not on file    Attends religious service: Not on file    Active member of club or organization: Not on file    Attends meetings of clubs or organizations: Not on file    Relationship status: Not on file  . Intimate partner violence    Fear of current or ex partner: Not on file    Emotionally abused: Not on file    Physically  abused: Not on file    Forced sexual activity: Not on file  Other Topics Concern  . Not on file  Social History Narrative  . Not on file    Family History  Problem Relation Age of Onset  . Other Other        Negative CAD  . Other Other        Negative DM2  . Other Other        Negative HTN  . Other Other        Negative Cancer    Past Surgical History:  Procedure Laterality Date  . ESOPHAGOGASTRODUODENOSCOPY (EGD) WITH PROPOFOL N/A 11/18/2017   Procedure: ESOPHAGOGASTRODUODENOSCOPY (EGD) WITH PROPOFOL;  Surgeon: Vida Rigger, MD;  Location: [facility] ENDOSCOPY;  Service: Endoscopy;  Laterality: N/A;  . None    . TRANSURETHRAL RESECTION OF PROSTATE N/A 03/16/2013   Procedure: TRANSURETHRAL RESECTION OF THE PROSTATE WITH GYRUS INSTRUMENTS;  Surgeon: Marcine Matar, MD;  Location: [facility] ORS;  Service: Urology;  Laterality: N/A;    ROS: Review of Systems Negative except as stated above  PHYSICAL EXAM: BP (!) 172/111   Pulse 88   Temp 98.5 F (36.9 C) (Oral)   Resp 16   Ht 5' (1.524 m)   Wt 101 lb 3.2 oz (45.9 kg)   SpO2 99%   BMI 19.76 kg/m   Wt Readings from Last 3 Encounters:  11/30/19 101 lb 3.2 oz (45.9 kg)  05/12/19 100 lb (45.4 kg)  01/11/18 98 lb 5.2 oz (44.6 kg)  BP 164/100  Physical Exam General appearance -frail elderly male in NAD.  Patient is in wheelchair.   Mental status -difficult to assess mental status due to decreased hearing.  Patient has a very difficult time following commands and unable to do so consistently. Eyes -Pink conjunctiva.  Pupils are equal and reactive. Nose - normal and patent, no erythema, discharge or polyps Mouth -oral mucosa is moist.  Poor oral hygiene.  Quite a number of his teeth are decayed.   Neck - supple, no significant adenopathy Chest - clear to auscultation, no wheezes, rales or rhonchi, symmetric air entry Heart - normal rate, regular rhythm, normal S1, S2, no murmurs, rubs, clicks or gallops Neurological -patient unable  to follow commands consistently.  He appears to have decreased hearing.  Gait is slowed with very decreased foot to floor clearance.  He requires assistance with getting up and with sitting down.  Difficult to assess grip as patient was unable to follow commands consistently Extremities -no lower extremity edema  CMP Latest Ref Rng & Units 05/17/2019 05/16/2019 05/15/2019  Glucose 70 - 99 mg/dL 161(W) 960(A) 540(J)  BUN 8 - 23 mg/dL 81(X) 91(Y) 78(G)  Creatinine 0.61 - 1.24 mg/dL 9.56 2.13(Y) 8.65(H)  Sodium 135 - 145 mmol/L 141 141 138  Potassium 3.5 - 5.1 mmol/L 3.3(L) 3.8 3.7  Chloride 98 - 111 mmol/L 111 109 107  CO2 22 - 32 mmol/L 20(L) 21(L) 24  Calcium 8.9 - 10.3 mg/dL 8.4(O) 9.6(E) 9.5(M)  Total Protein 6.5 - 8.1 g/dL 6.3(L) 6.7 7.0  Total Bilirubin 0.3 - 1.2 mg/dL 0.3 0.3 0.6  Alkaline Phos 38 - 126 U/L 49 51 50  AST 15 - 41 U/L 32 38 50(H)  ALT 0 - 44 U/L Lipid Panel     Component Value Date/Time   CHOL 177 02/23/2014 1740   TRIG 92 05/13/2019 0527   HDL 40 02/23/2014 1740   CHOLHDL 4.4 02/23/2014 1740   VLDL 20 02/23/2014 1740   LDLCALC 117 (H) 02/23/2014 1740    CBC    Component Value Date/Time   WBC 4.6 05/17/2019 0532   RBC 3.58 (L) 05/17/2019 0532   HGB 11.2 (L) 05/17/2019 0532   HCT 32.0 (L) 05/17/2019 0532   PLT 114 (L) 05/17/2019 0532   MCV 89.4 05/17/2019 0532   MCH 31.3 05/17/2019 0532   MCHC 35.0 05/17/2019 0532   RDW 14.0 05/17/2019 0532   LYMPHSABS 0.3 (L) 05/17/2019 0532   MONOABS 0.1 05/17/2019 0532   EOSABS 0.0 05/17/2019 0532   BASOSABS 0.0 05/17/2019 0532    ASSESSMENT AND PLAN: 1. Dementia without behavioral disturbance, unspecified dementia type [facility]) -Patient requires and I recommend 24-hour supervision.  He does have a home health aide.  Family is inquiring about possibly increasing his hours to allow grandsons wife to do things that she may need to do during the day. -They are also requesting a letter for him to be exempt from  having to take citizenship exam.  I informed them that this is usually done through a form that they need to get from immigration. - Vitamin B12  2. Essential hypertension Start amlodipine.  Advised low-salt diet. - amLODipine (NORVASC) 5 MG tablet; Take 1 tablet (5 mg total) by mouth daily.  Dispense: 90 tablet; Refill: 3 - CBC With Differential - Comprehensive metabolic panel  3. History of recent fall Usually I would refer to physical therapy but I doubt this would be of much benefit to him given that patient has dementia and unable to understand and follow instructions - VITAMIN D 25 Hydroxy (Vit-D Deficiency, Fractures)  4. Gastroesophageal reflux disease without esophagitis GERD precautions discussed with his family.  I recommend avoiding foods that causes increased heartburn including tomato-based foods, spicy foods, juices.  We will put him on low-dose omeprazole.  Advised him to make sure he eats his last meal at least 2 to 3 hours before he lays down and for him to sleep with his head a little elevated - omeprazole (PRILOSEC) 20 MG capsule; Take 1 capsule (20 mg total) by mouth daily.  Dispense: 30 capsule; Refill: 3  5. Failure to thrive in adult  - feeding supplement, ENSURE ENLIVE, (ENSURE ENLIVE) LIQD; Take 237 mLs by mouth daily.  Dispense: 237 mL; Refill: 12  6. Functional urinary incontinence 7. Incontinence of feces, unspecified fecal incontinence type Associated with dementia.  He uses depends.  8. Decreased hearing of both ears May consider referral to ENT  9. Need for influenza vaccination Given 10. Need for vaccination against Streptococcus  pneumoniae Prevnar 13 given  11. Poor vision - Ambulatory referral to Ophthalmology   I spent about 25 minutes with patient and his family discussing diagnosis, management and coordinating care. Patient was given the opportunity to ask questions.  Patient verbalized understanding of the plan and was able to repeat key  elements of the plan.   Addendum 12/01/2019: Patient with low vitamin D level.  I will have my medical assistant call and tell the grandson to start him on vitamin D 400 IU daily.  Vitamin B12 level also noted to be low.  He will need to be started on B12 shots.  My CMA to call and schedule.  Blood sugar was elevated.  We will check an A1c.  Orders Placed This Encounter  Procedures  . Flu Vaccine QUAD 6+ mos PF IM (Fluarix Quad PF)  . Pneumococcal conjugate vaccine 13-valent  . Tdap vaccine greater than or equal to 7yo IM  . CBC With Differential  . Comprehensive metabolic panel  . Vitamin B12  . VITAMIN D 25 Hydroxy (Vit-D Deficiency, Fractures)  . Ambulatory referral to Ophthalmology     Requested Prescriptions   Signed Prescriptions Disp Refills  . omeprazole (PRILOSEC) 20 MG capsule 30 capsule 3    Sig: Take 1 capsule (20 mg total) by mouth daily.  Marland Kitchen. amLODipine (NORVASC) 5 MG tablet 90 tablet 3    Sig: Take 1 tablet (5 mg total) by mouth daily.  . feeding supplement, ENSURE ENLIVE, (ENSURE ENLIVE) LIQD 237 mL 12    Sig: Take 237 mLs by mouth daily.    Return in about 1 month (around 12/31/2019) for in person for immigration form completion.  Jonah Blueeborah Velencia Lenart, MD, FACP

## 2019-11-30 NOTE — Patient Instructions (Addendum)
Pneumococcal Conjugate Vaccine (PCV13): What You Need to Know 1. Why get vaccinated? Pneumococcal conjugate vaccine (PCV13) can prevent pneumococcal disease. Pneumococcal disease refers to any illness caused by pneumococcal bacteria. These bacteria can cause many types of illnesses, including pneumonia, which is an infection of the lungs. Pneumococcal bacteria are one of the most common causes of pneumonia. Besides pneumonia, pneumococcal bacteria can also cause:  Ear infections  Sinus infections  Meningitis (infection of the tissue covering the brain and spinal cord)  Bacteremia (bloodstream infection) Anyone can get pneumococcal disease, but children under 752 years of age, people with certain medical conditions, adults 65 years or older, and cigarette smokers are at the highest risk. Most pneumococcal infections are mild. However, some can result in long-term problems, such as brain damage or hearing loss. Meningitis, bacteremia, and pneumonia caused by pneumococcal disease can be fatal. 2. PCV13 PCV13 protects against 13 types of bacteria that cause pneumococcal disease. Infants and young children usually need 4 doses of pneumococcal conjugate vaccine, at 2, 4, 6, and 6912-2315 months of age. In some cases, a child might need fewer than 4 doses to complete PCV13 vaccination. A dose of PCV23 vaccine is also recommended for anyone 2 years or older with certain medical conditions if they did not already receive PCV13. This vaccine may be given to adults 65 years or older based on discussions between the patient and health care provider. 3. Talk with your health care provider Tell your vaccine provider if the person getting the vaccine:  Has had an allergic reaction after a previous dose of PCV13, to an earlier pneumococcal conjugate vaccine known as PCV7, or to any vaccine containing diphtheria toxoid (for example, DTaP), or has any severe, life-threatening allergies.  In some cases, your  health care provider may decide to postpone PCV13 vaccination to a future visit. People with minor illnesses, such as a cold, may be vaccinated. People who are moderately or severely ill should usually wait until they recover before getting PCV13. Your health care provider can give you more information. 4. Risks of a vaccine reaction  Redness, swelling, pain, or tenderness where the shot is given, and fever, loss of appetite, fussiness (irritability), feeling tired, headache, and chills can happen after PCV13. Young children may be at increased risk for seizures caused by fever after PCV13 if it is administered at the same time as inactivated influenza vaccine. Ask your health care provider for more information. People sometimes faint after medical procedures, including vaccination. Tell your provider if you feel dizzy or have vision changes or ringing in the ears. As with any medicine, there is a very remote chance of a vaccine causing a severe allergic reaction, other serious injury, or death. 5. What if there is a serious problem? An allergic reaction could occur after the vaccinated person leaves the clinic. If you see signs of a severe allergic reaction (hives, swelling of the face and throat, difficulty breathing, a fast heartbeat, dizziness, or weakness), call 9-1-1 and get the person to the nearest hospital. For other signs that concern you, call your health care provider. Adverse reactions should be reported to the Vaccine Adverse Event Reporting System (VAERS). Your health care provider will usually file this report, or you can do it yourself. Visit the VAERS website at www.vaers.LAgents.nohhs.gov or call 925-211-53781-(580)637-9145. VAERS is only for reporting reactions, and VAERS staff do not give medical advice. 6. The National Vaccine Injury Compensation Program The [employer] Vaccine Injury Compensation Program (VICP) is a Stage managerfederal program  that was created to compensate people who may have been injured by certain  vaccines. Visit the VICP website at GoldCloset.com.ee or call 563-545-2761 to learn about the program and about filing a claim. There is a time limit to file a claim for compensation. 7. How can I learn more?  Ask your health care provider.  Call your local or state health department.  Contact the Centers for Disease Control and Prevention (CDC): ? Call 980-887-0531 (1-800-CDC-INFO) or ? Visit CDC's website at http://hunter.com/ Vaccine Information Statement PCV13 Vaccine (10/22/2018) This information is not intended to replace advice given to you by your health care provider. Make sure you discuss any questions you have with your health care provider. Document Released: 10/07/2006 Document Revised: 03/31/2019 Document Reviewed: 07/22/2018 Elsevier Patient Education  Martin Tanner.  Influenza Virus Vaccine injection (Fluarix) What is this medicine? INFLUENZA VIRUS VACCINE (in floo EN zuh VAHY ruhs vak SEEN) helps to reduce the risk of getting influenza also known as the flu. This medicine may be used for other purposes; ask your health care provider or pharmacist if you have questions. COMMON BRAND NAME(S): Fluarix, Fluzone What should I tell my health care provider before I take this medicine? They need to know if you have any of these conditions:  bleeding disorder like hemophilia  fever or infection  Guillain-Barre syndrome or other neurological problems  immune system problems  infection with the human immunodeficiency virus (HIV) or AIDS  low blood platelet counts  multiple sclerosis  an unusual or allergic reaction to influenza virus vaccine, eggs, chicken proteins, latex, gentamicin, other medicines, foods, dyes or preservatives  pregnant or trying to get pregnant  breast-feeding How should I use this medicine? This vaccine is for injection into a muscle. It is given by a health care professional. A copy of Vaccine Information Statements  will be given before each vaccination. Read this sheet carefully each time. The sheet may change frequently. Talk to your pediatrician regarding the use of this medicine in children. Special care may be needed. Overdosage: If you think you have taken too much of this medicine contact a poison control center or emergency room at once. NOTE: This medicine is only for you. Do not share this medicine with others. What if I miss a dose? This does not apply. What may interact with this medicine?  chemotherapy or radiation therapy  medicines that lower your immune system like etanercept, anakinra, infliximab, and adalimumab  medicines that treat or prevent blood clots like warfarin  phenytoin  steroid medicines like prednisone or cortisone  theophylline  vaccines This list may not describe all possible interactions. Give your health care provider a list of all the medicines, herbs, non-prescription drugs, or dietary supplements you use. Also tell them if you smoke, drink alcohol, or use illegal drugs. Some items may interact with your medicine. What should I watch for while using this medicine? Report any side effects that do not go away within 3 days to your doctor or health care professional. Call your health care provider if any unusual symptoms occur within 6 weeks of receiving this vaccine. You may still catch the flu, but the illness is not usually as bad. You cannot get the flu from the vaccine. The vaccine will not protect against colds or other illnesses that may cause fever. The vaccine is needed every year. What side effects may I notice from receiving this medicine? Side effects that you should report to your doctor or health care professional as  soon as possible:  allergic reactions like skin rash, itching or hives, swelling of the face, lips, or tongue Side effects that usually do not require medical attention (report to your doctor or health care professional if they continue or  are bothersome):  fever  headache  muscle aches and pains  pain, tenderness, redness, or swelling at site where injected  weak or tired This list may not describe all possible side effects. Call your doctor for medical advice about side effects. You may report side effects to FDA at 1-800-FDA-1088. Where should I keep my medicine? This vaccine is only given in a clinic, pharmacy, doctor's office, or other health care setting and will not be stored at home. NOTE: This sheet is a summary. It may not cover all possible information. If you have questions about this medicine, talk to your doctor, pharmacist, or health care provider.  2020 Elsevier/Gold Standard (2008-07-07 09:30:40)

## 2019-12-01 ENCOUNTER — Telehealth: Payer: Self-pay

## 2019-12-01 DIAGNOSIS — H539 Unspecified visual disturbance: Secondary | ICD-10-CM | POA: Diagnosis not present

## 2019-12-01 LAB — COMPREHENSIVE METABOLIC PANEL
ALT: 15 IU/L (ref 0–44)
AST: 23 IU/L (ref 0–40)
Albumin/Globulin Ratio: 1.4 (ref 1.2–2.2)
Albumin: 4.5 g/dL (ref 3.6–4.6)
Alkaline Phosphatase: 116 IU/L (ref 39–117)
BUN/Creatinine Ratio: 14 (ref 10–24)
BUN: 17 mg/dL (ref 8–27)
Bilirubin Total: 0.5 mg/dL (ref 0.0–1.2)
CO2: 20 mmol/L (ref 20–29)
Calcium: 9.3 mg/dL (ref 8.6–10.2)
Chloride: 104 mmol/L (ref 96–106)
Creatinine, Ser: 1.21 mg/dL (ref 0.76–1.27)
GFR calc Af Amer: 61 mL/min/{1.73_m2} (ref >59)
GFR calc non Af Amer: 53 mL/min/{1.73_m2} — ABNORMAL LOW (ref >59)
Globulin, Total: 3.2 g/dL (ref 1.5–4.5)
Glucose: 101 mg/dL — ABNORMAL HIGH (ref 65–99)
Potassium: 4.3 mmol/L (ref 3.5–5.2)
Sodium: 143 mmol/L (ref 134–144)
Total Protein: 7.7 g/dL (ref 6.0–8.5)

## 2019-12-01 LAB — CBC WITH DIFFERENTIAL
Basophils Absolute: 0.1 10*3/uL (ref 0.0–0.2)
Basos: 1 %
EOS (ABSOLUTE): 0.3 10*3/uL (ref 0.0–0.4)
Eos: 5 %
Hematocrit: 39.7 % (ref 37.5–51.0)
Hemoglobin: 13.5 g/dL (ref 13.0–17.7)
Immature Grans (Abs): 0 10*3/uL (ref 0.0–0.1)
Immature Granulocytes: 0 %
Lymphocytes Absolute: 1.1 10*3/uL (ref 0.7–3.1)
Lymphs: 21 %
MCH: 29 pg (ref 26.6–33.0)
MCHC: 34 g/dL (ref 31.5–35.7)
MCV: 85 fL (ref 79–97)
Monocytes Absolute: 0.3 10*3/uL (ref 0.1–0.9)
Monocytes: 6 %
Neutrophils Absolute: 3.7 10*3/uL (ref 1.4–7.0)
Neutrophils: 67 %
RBC: 4.65 x10E6/uL (ref 4.14–5.80)
RDW: 13.4 % (ref 11.6–15.4)
WBC: 5.5 10*3/uL (ref 3.4–10.8)

## 2019-12-01 LAB — VITAMIN D 25 HYDROXY (VIT D DEFICIENCY, FRACTURES): Vit D, 25-Hydroxy: 24.6 ng/mL — ABNORMAL LOW (ref 30.0–100.0)

## 2019-12-01 LAB — VITAMIN B12: Vitamin B-12: 218 pg/mL — ABNORMAL LOW (ref 232–1245)

## 2019-12-01 MED ORDER — CYANOCOBALAMIN 1000 MCG/ML IJ SOLN: 1000.0000 ug | INTRAMUSCULAR | Status: AC

## 2019-12-01 NOTE — Addendum Note (Signed)
Addended by: Karle Plumber B on: 12/01/2019 12:52 PM   Modules accepted: Orders

## 2019-12-01 NOTE — Telephone Encounter (Signed)
At request of Dr Wynetta Emery, call placed to patient's grandson, Roosevelt Locks with assistance of Nepali interpreter # [phone] from [employer].   [name] explained that patient receives PCS:2 hours x 7 days/week and they would like to increase the hours.  The patient is alone at home  during the day when the family is working, so he is by himself 8-10 hours/day when the aide is not there.  [name] said that sometimes he will go to the bathroom by himself, other times he will use diapers. The aide will set up food for him to have available when he is alone.  This CM voiced concerns about patient being alone and explained that he should have someone with him all of the time. He is not able to call for help as he does not use the phone as per his grandson.   This CM explained that a request can be made for increasing the PCS hours but that might only add a couple of hours a week, nothing that will cover all of the time that he is alone.  This CM also explained the CAP program as an option.  It will provide more hours but not cover the entire time they are at work.  There is a wait list for the program. the grandson was provided with the phone # for CAP to put his grandfather on the wait list.   This CM also explained that a day program is available but it is private pay.  This the program at [facility].  The grandson did not want to discuss this option and was focused on CAP. Again, this CM reminded him that there is a wait list and the total hours will not cover the entire time that they are at work.

## 2019-12-02 DIAGNOSIS — H539 Unspecified visual disturbance: Secondary | ICD-10-CM | POA: Diagnosis not present

## 2019-12-03 DIAGNOSIS — H539 Unspecified visual disturbance: Secondary | ICD-10-CM | POA: Diagnosis not present

## 2019-12-04 DIAGNOSIS — H539 Unspecified visual disturbance: Secondary | ICD-10-CM | POA: Diagnosis not present

## 2019-12-05 DIAGNOSIS — H539 Unspecified visual disturbance: Secondary | ICD-10-CM | POA: Diagnosis not present

## 2019-12-06 DIAGNOSIS — H539 Unspecified visual disturbance: Secondary | ICD-10-CM | POA: Diagnosis not present

## 2019-12-07 DIAGNOSIS — H539 Unspecified visual disturbance: Secondary | ICD-10-CM | POA: Diagnosis not present

## 2019-12-07 LAB — SPECIMEN STATUS REPORT

## 2019-12-07 LAB — HEMOGLOBIN A1C
Est. average glucose Bld gHb Est-mCnc: 108 mg/dL
Hgb A1c MFr Bld: 5.4 % (ref 4.8–5.6)

## 2019-12-08 DIAGNOSIS — H539 Unspecified visual disturbance: Secondary | ICD-10-CM | POA: Diagnosis not present

## 2019-12-09 DIAGNOSIS — H539 Unspecified visual disturbance: Secondary | ICD-10-CM | POA: Diagnosis not present

## 2019-12-10 DIAGNOSIS — H539 Unspecified visual disturbance: Secondary | ICD-10-CM | POA: Diagnosis not present

## 2019-12-11 DIAGNOSIS — H539 Unspecified visual disturbance: Secondary | ICD-10-CM | POA: Diagnosis not present

## 2019-12-12 DIAGNOSIS — H539 Unspecified visual disturbance: Secondary | ICD-10-CM | POA: Diagnosis not present

## 2019-12-13 DIAGNOSIS — H539 Unspecified visual disturbance: Secondary | ICD-10-CM | POA: Diagnosis not present

## 2019-12-14 DIAGNOSIS — H539 Unspecified visual disturbance: Secondary | ICD-10-CM | POA: Diagnosis not present

## 2019-12-15 DIAGNOSIS — H539 Unspecified visual disturbance: Secondary | ICD-10-CM | POA: Diagnosis not present

## 2019-12-16 DIAGNOSIS — H539 Unspecified visual disturbance: Secondary | ICD-10-CM | POA: Diagnosis not present

## 2019-12-17 DIAGNOSIS — H539 Unspecified visual disturbance: Secondary | ICD-10-CM | POA: Diagnosis not present

## 2019-12-18 DIAGNOSIS — H539 Unspecified visual disturbance: Secondary | ICD-10-CM | POA: Diagnosis not present

## 2019-12-19 DIAGNOSIS — H539 Unspecified visual disturbance: Secondary | ICD-10-CM | POA: Diagnosis not present

## 2019-12-20 DIAGNOSIS — H539 Unspecified visual disturbance: Secondary | ICD-10-CM | POA: Diagnosis not present

## 2019-12-21 DIAGNOSIS — H539 Unspecified visual disturbance: Secondary | ICD-10-CM | POA: Diagnosis not present

## 2019-12-22 DIAGNOSIS — H539 Unspecified visual disturbance: Secondary | ICD-10-CM | POA: Diagnosis not present

## 2019-12-23 DIAGNOSIS — H539 Unspecified visual disturbance: Secondary | ICD-10-CM | POA: Diagnosis not present

## 2019-12-24 DIAGNOSIS — H539 Unspecified visual disturbance: Secondary | ICD-10-CM | POA: Diagnosis not present

## 2019-12-25 DIAGNOSIS — H539 Unspecified visual disturbance: Secondary | ICD-10-CM | POA: Diagnosis not present

## 2019-12-26 DIAGNOSIS — H539 Unspecified visual disturbance: Secondary | ICD-10-CM | POA: Diagnosis not present

## 2019-12-27 DIAGNOSIS — H539 Unspecified visual disturbance: Secondary | ICD-10-CM | POA: Diagnosis not present

## 2019-12-28 DIAGNOSIS — H539 Unspecified visual disturbance: Secondary | ICD-10-CM | POA: Diagnosis not present

## 2019-12-29 DIAGNOSIS — H539 Unspecified visual disturbance: Secondary | ICD-10-CM | POA: Diagnosis not present

## 2019-12-30 DIAGNOSIS — H539 Unspecified visual disturbance: Secondary | ICD-10-CM | POA: Diagnosis not present

## 2019-12-31 DIAGNOSIS — H539 Unspecified visual disturbance: Secondary | ICD-10-CM | POA: Diagnosis not present

## 2020-01-01 DIAGNOSIS — H539 Unspecified visual disturbance: Secondary | ICD-10-CM | POA: Diagnosis not present

## 2020-01-02 DIAGNOSIS — H539 Unspecified visual disturbance: Secondary | ICD-10-CM | POA: Diagnosis not present

## 2020-01-03 DIAGNOSIS — H539 Unspecified visual disturbance: Secondary | ICD-10-CM | POA: Diagnosis not present

## 2020-01-04 DIAGNOSIS — H539 Unspecified visual disturbance: Secondary | ICD-10-CM | POA: Diagnosis not present

## 2020-01-05 DIAGNOSIS — H539 Unspecified visual disturbance: Secondary | ICD-10-CM | POA: Diagnosis not present

## 2020-01-06 DIAGNOSIS — H539 Unspecified visual disturbance: Secondary | ICD-10-CM | POA: Diagnosis not present

## 2020-01-07 DIAGNOSIS — H539 Unspecified visual disturbance: Secondary | ICD-10-CM | POA: Diagnosis not present

## 2020-01-08 DIAGNOSIS — H539 Unspecified visual disturbance: Secondary | ICD-10-CM | POA: Diagnosis not present

## 2020-01-09 DIAGNOSIS — H539 Unspecified visual disturbance: Secondary | ICD-10-CM | POA: Diagnosis not present

## 2020-01-10 DIAGNOSIS — H539 Unspecified visual disturbance: Secondary | ICD-10-CM | POA: Diagnosis not present

## 2020-01-11 DIAGNOSIS — H539 Unspecified visual disturbance: Secondary | ICD-10-CM | POA: Diagnosis not present

## 2020-01-12 DIAGNOSIS — H539 Unspecified visual disturbance: Secondary | ICD-10-CM | POA: Diagnosis not present

## 2020-01-13 DIAGNOSIS — H539 Unspecified visual disturbance: Secondary | ICD-10-CM | POA: Diagnosis not present

## 2020-01-14 DIAGNOSIS — H539 Unspecified visual disturbance: Secondary | ICD-10-CM | POA: Diagnosis not present

## 2020-01-15 DIAGNOSIS — H539 Unspecified visual disturbance: Secondary | ICD-10-CM | POA: Diagnosis not present

## 2020-01-16 DIAGNOSIS — H539 Unspecified visual disturbance: Secondary | ICD-10-CM | POA: Diagnosis not present

## 2020-01-17 DIAGNOSIS — H539 Unspecified visual disturbance: Secondary | ICD-10-CM | POA: Diagnosis not present

## 2020-01-18 ENCOUNTER — Other Ambulatory Visit: Payer: Self-pay

## 2020-01-18 ENCOUNTER — Ambulatory Visit: Payer: Medicaid Other | Attending: Internal Medicine | Admitting: Internal Medicine

## 2020-01-18 ENCOUNTER — Encounter: Payer: Self-pay | Admitting: Internal Medicine

## 2020-01-18 VITALS — BP 127/64 | HR 81 | Temp 97.9°F | Resp 16 | Wt 106.4 lb

## 2020-01-18 DIAGNOSIS — N189 Chronic kidney disease, unspecified: Secondary | ICD-10-CM | POA: Insufficient documentation

## 2020-01-18 DIAGNOSIS — F17210 Nicotine dependence, cigarettes, uncomplicated: Secondary | ICD-10-CM | POA: Diagnosis not present

## 2020-01-18 DIAGNOSIS — I129 Hypertensive chronic kidney disease with stage 1 through stage 4 chronic kidney disease, or unspecified chronic kidney disease: Secondary | ICD-10-CM | POA: Diagnosis not present

## 2020-01-18 DIAGNOSIS — X58XXXA Exposure to other specified factors, initial encounter: Secondary | ICD-10-CM | POA: Diagnosis not present

## 2020-01-18 DIAGNOSIS — R600 Localized edema: Secondary | ICD-10-CM

## 2020-01-18 DIAGNOSIS — Z79899 Other long term (current) drug therapy: Secondary | ICD-10-CM | POA: Diagnosis not present

## 2020-01-18 DIAGNOSIS — K219 Gastro-esophageal reflux disease without esophagitis: Secondary | ICD-10-CM | POA: Diagnosis not present

## 2020-01-18 DIAGNOSIS — R159 Full incontinence of feces: Secondary | ICD-10-CM | POA: Diagnosis not present

## 2020-01-18 DIAGNOSIS — Z550 Illiteracy and low-level literacy: Secondary | ICD-10-CM | POA: Diagnosis not present

## 2020-01-18 DIAGNOSIS — T753XXA Motion sickness, initial encounter: Secondary | ICD-10-CM | POA: Diagnosis not present

## 2020-01-18 DIAGNOSIS — H539 Unspecified visual disturbance: Secondary | ICD-10-CM | POA: Diagnosis not present

## 2020-01-18 DIAGNOSIS — D631 Anemia in chronic kidney disease: Secondary | ICD-10-CM | POA: Diagnosis not present

## 2020-01-18 DIAGNOSIS — I10 Essential (primary) hypertension: Secondary | ICD-10-CM

## 2020-01-18 DIAGNOSIS — E538 Deficiency of other specified B group vitamins: Secondary | ICD-10-CM

## 2020-01-18 DIAGNOSIS — E559 Vitamin D deficiency, unspecified: Secondary | ICD-10-CM | POA: Diagnosis not present

## 2020-01-18 DIAGNOSIS — H538 Other visual disturbances: Secondary | ICD-10-CM | POA: Insufficient documentation

## 2020-01-18 DIAGNOSIS — F0390 Unspecified dementia without behavioral disturbance: Secondary | ICD-10-CM | POA: Diagnosis not present

## 2020-01-18 MED ORDER — LISINOPRIL 5 MG PO TABS: 5.0000 mg | ORAL_TABLET | Freq: Every day | ORAL | 3 refills | Status: DC

## 2020-01-18 MED ORDER — CYANOCOBALAMIN 1000 MCG/ML IJ SOLN
1000.0000 ug | INTRAMUSCULAR | Status: DC
  Administered 2020-01-18: 16:00:00 1000 ug via INTRAMUSCULAR

## 2020-01-18 MED ORDER — MECLIZINE HCL 12.5 MG PO TABS: 12.5000 mg | ORAL_TABLET | Freq: Every day | ORAL | 0 refills | Status: DC | PRN

## 2020-01-18 MED ORDER — VITAMIN B-12 500 MCG PO TABS: 1000.0000 ug | ORAL_TABLET | Freq: Every day | ORAL | 1 refills | Status: DC

## 2020-01-18 MED ORDER — VITAMIN D2 10 MCG (400 UNIT) PO TABS: ORAL_TABLET | ORAL | 1 refills | Status: DC

## 2020-01-18 NOTE — Patient Instructions (Signed)
STOP AMLODIPINE.  This may be causing the swelling in your legs. Start Lisinopril 5 mg daily instead for blood pressure.

## 2020-01-18 NOTE — Progress Notes (Signed)
Patient ID: Martin Tanner, male    DOB: 12/09/1928  MRN: [id number]  CC: paperwork (immagration )   Subjective: Martin Tanner is a 86 y.o. male who presents for eval to be exempted from taking the immigration citizenship exam.  Martin Tanner, Martin Tanner, is with him and provides some of the history His concerns today include:  Patient with history of dementia, HTN, GERD, functional urinary incontinence, fecal incontinence, decreased hearing  Pt's grandson is requesting that he be examined from taking the immigration citizenship examination. Patient is from [place].  Patient reports that he never went to school in his country.  He is not able to read or write in his own language or in [place].  He speaks very thick dialect that sometimes the interpreter has problems understanding him.  The information has to be relayed through his grandson from the interpreter sometimes. Lived in [place] 8 years.  He never took [place] as second language He has poor vision.  He has an eye exam with an ophthalmologist on 01/25/2020. He has poor hearing.  He does not have an appointment with ENT When he was seen last month we did some blood tests as part of work-up for dementia.  He was found to be B12 deficient.  Patient reports swelling in both legs for the past 2 weeks.  He denies any shortness of breath at rest on exertion.  No chest pains.  On last visit he was started on amlodipine for essential hypertension.    Martin Tanner is requesting medication for him to use as needed for nausea.  He gets nauseated and a little dizzy only when having to ride in a car.     Patient Active Problem List   Diagnosis Date Noted  . Essential hypertension 11/30/2019  . History of recent fall 11/30/2019  . Gastroesophageal reflux disease without esophagitis 11/30/2019  . Failure to thrive in adult 11/30/2019  . Functional urinary incontinence 11/30/2019  . Incontinence of feces 11/30/2019  . Dementia without behavioral disturbance (HCC)  11/30/2019  . Anemia due to chronic kidney disease 11/18/2017  . CKD (chronic kidney disease) 11/18/2017  . 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 Thrombocytopenia (HCC) 01/28/2013  . Urinary retention 01/25/2013  . Weakness generalized 01/25/2013     Current Outpatient Medications on File Prior to Visit  Medication Sig Dispense Refill  . feeding supplement, ENSURE ENLIVE, (ENSURE ENLIVE) LIQD Take 237 mLs by mouth daily. 237 mL 12  . omeprazole (PRILOSEC) 20 MG capsule Take 1 capsule (20 mg total) by mouth daily. 30 capsule 3   No current facility-administered medications on file prior to visit.    Allergies  Allergen Reactions  . Meat [Alpha-Gal] Other (See Comments)    Food preference, Pt is a Vegetarian... Dietary restrictions only.  Not for Medication purposes    Social History   Socioeconomic History  . Marital status: Widowed    Spouse name: Not on file  . Number of children: Not on file  . Years of education: Not on file  . Highest education level: Not on file  Occupational History  . Not on file  Tobacco Use  . Smoking status: Current Every Day Smoker    Packs/day: 0.25    Years: 62.00    Pack years: 15.50    Types: Cigarettes  . Smokeless tobacco: Never Used  Substance and Sexual Activity  . Alcohol use: No    Alcohol/week: 0.0 standard drinks  . Drug use: No  . Sexual activity: Never  Other Topics Concern  . Not on file  Social History Narrative  . Not on file   Social Determinants of Health   Financial Resource Strain:   . Difficulty of Paying Living Expenses: Not on file  Food Insecurity:   . Worried About Martin Tanner in the Last Year: Not on file  . Ran Out of Food in the Last Year: Not on file  Transportation Needs:   . Lack of Transportation (Medical): Not on file  . Lack of Transportation (Non-Medical): Not on file  Physical Activity:   . Days of Exercise per Week: Not on file  . Minutes of Exercise per Session: Not on file  Stress:   .  Feeling of Stress : Not on file  Social Connections:   . Frequency of Communication with Friends and Family: Not on file  . Frequency of Social Gatherings with Friends and Family: Not on file  . Attends Religious Services: Not on file  . Active Member of Clubs or Organizations: Not on file  . Attends Banker Meetings: Not on file  . Marital Status: Not on file  Intimate Partner Violence:   . Fear of Current or Ex-Partner: Not on file  . Emotionally Abused: Not on file  . Physically Abused: Not on file  . Sexually Abused: Not on file    Family History  Problem Relation Age of Onset  . Other Other        Negative CAD  . Other Other        Negative DM2  . Other Other        Negative HTN  . Other Other        Negative Cancer    Past Surgical History:  Procedure Laterality Date  . ESOPHAGOGASTRODUODENOSCOPY (EGD) WITH PROPOFOL N/A 11/18/2017   Procedure: ESOPHAGOGASTRODUODENOSCOPY (EGD) WITH PROPOFOL;  Surgeon: Vida Rigger, MD;  [facility] END[facility];  Service: Endoscopy;  Laterality: N/A;  . None    . TRANSURETHRAL RESECTION OF PROSTATE N/A 03/16/2013   Procedure: TRANSURETHRAL RESECTION OF THE PROSTATE WITH GYRUS INSTRUMENTS;  Surgeon: Marcine Matar, MD;  Location: [facility] ORS;  Service: Urology;  Laterality: N/A;    ROS: Review of Systems Negative except as stated above  PHYSICAL EXAM: BP 127/64   Pulse 81   Temp 97.9 F (36.6 C) (Oral)   Resp 16   Wt 106 lb 6.4 oz (48.3 kg)   SpO2 97%   BMI 20.78 kg/m   Wt Readings from Last 3 Encounters:  01/18/20 106 lb 6.4 oz (48.3 kg)  11/30/19 101 lb 3.2 oz (45.9 kg)  05/12/19 100 lb (45.4 kg)    Physical Exam General appearance -frail elderly mail in NAD Mental status - pt unable to correctly give month, year and day of wk Ears - bilateral TM's and external ear canals normal Neck - supple, no significant adenopathy Chest - clear to auscultation, no wheezes, rales or rhonchi, symmetric air entry Heart -  normal rate, regular rhythm, normal S1, S2, no murmurs, rubs, clicks or gallops.  No JVD Extremities - 1+ BL LE edema  CMP Latest Ref Rng & Units 11/30/2019 05/17/2019 05/16/2019  Glucose 65 - 99 mg/dL 027(O) 536(U) 440(H)  BUN 8 - 27 mg/dL 17 47(Q) 25(Z)  Creatinine 0.76 - 1.27 mg/dL 5.63 8.75 6.43(P)  Sodium 134 - 144 mmol/L 143 141 141  Potassium 3.5 - 5.2 mmol/L 4.3 3.3(L) 3.8  Chloride 96 - 106 mmol/L 104 111 109  CO2 20 -  29 mmol/L 20 20(L) 21(L)  Calcium 8.6 - 10.2 mg/dL 9.3 8.4(L) 8.5(L)  Total Protein 6.0 - 8.5 g/dL 7.7 6.3(L) 6.7  Total Bilirubin 0.0 - 1.2 mg/dL 0.5 0.3 0.3  Alkaline Phos 39 - 117 IU/L 116 49 51  AST 0 - 40 IU/L 23 32 38  ALT 0 - 44 IU/L 15 23 23    Lipid Panel     Component Value Date/Time   CHOL 177 02/23/2014 1740   TRIG 92 05/13/2019 0527   HDL 40 02/23/2014 1740   CHOLHDL 4.4 02/23/2014 1740   VLDL 20 02/23/2014 1740   LDLCALC 117 (H) 02/23/2014 1740    CBC    Component Value Date/Time   WBC 5.5 11/30/2019 1203   WBC 4.6 05/17/2019 0532   RBC 4.65 11/30/2019 1203   RBC 3.58 (L) 05/17/2019 0532   HGB 13.5 11/30/2019 1203   HCT 39.7 11/30/2019 1203   PLT 114 (L) 05/17/2019 0532   MCV 85 11/30/2019 1203   MCH 29.0 11/30/2019 1203   MCH 31.3 05/17/2019 0532   MCHC 34.0 11/30/2019 1203   MCHC 35.0 05/17/2019 0532   RDW 13.4 11/30/2019 1203   LYMPHSABS 1.1 11/30/2019 1203   MONOABS 0.1 05/17/2019 0532   EOSABS 0.3 11/30/2019 1203   BASOSABS 0.1 11/30/2019 1203   Vit D level:  24.6 Vit B12 level: 218  ASSESSMENT AND PLAN:  1. Illiterate 2. Dementia without behavioral disturbance, unspecified dementia type ([place]) -should be exempt from citizenship exam based on these 2 diagnosis alone.  3. Vitamin B12 deficiency Can be contributing to dementia and gait disturbance.  Start B12 inj.  Family may not be available to bring him every week for the next 3-4 wks so we will give inj today and Q mth.  Supplement with oral B12 b/w today and next inj  which will be 1 mth from now. - cyanocobalamin ((VITAMIN B-12)) injection 1,000 mcg - vitamin B-12 (CYANOCOBALAMIN) 500 MCG tablet; Take 2 tablets (1,000 mcg total) by mouth daily.  Dispense: 60 tablet; Refill: 1  4. Vitamin D deficiency - Ergocalciferol (VITAMIN D2) 10 MCG (400 UNIT) TABS; 1 tab daily  Dispense: 100 tablet; Refill: 1  5. Edema of both legs Likely due to Norvasc.   D/C Norvasc Start Lisinopril instead - Brain natriuretic peptide  6. Essential hypertension See #5 above - lisinopril (ZESTRIL) 5 MG tablet; Take 1 tablet (5 mg total) by mouth daily.  Dispense: 30 tablet; Refill: 3  7. Motion sickness, initial encounter - meclizine (ANTIVERT) 12.5 MG tablet; Take 1 tablet (12.5 mg total) by mouth daily as needed for nausea.  Dispense: 30 tablet; Refill: 0    Patient was given the opportunity to ask questions.  Patient verbalized understanding of the plan and was able to repeat key elements of the plan.  [employer] interpreter used during this encounter. #[id number], [employer]   Orders Placed This Encounter  Procedures  . Brain natriuretic peptide     Requested Prescriptions   Signed Prescriptions Disp Refills  . vitamin B-12 (CYANOCOBALAMIN) 500 MCG tablet 60 tablet 1    Sig: Take 2 tablets (1,000 mcg total) by mouth daily.  Marland Kitchen lisinopril (ZESTRIL) 5 MG tablet 30 tablet 3    Sig: Take 1 tablet (5 mg total) by mouth daily.  . meclizine (ANTIVERT) 12.5 MG tablet 30 tablet 0    Sig: Take 1 tablet (12.5 mg total) by mouth daily as needed for nausea.  . Ergocalciferol (VITAMIN D2)  10 MCG (400 UNIT) TABS 100 tablet 1    Sig: 1 tab daily    Return in about 4 weeks (around 02/15/2020) for f/u LE edema and get another B12 shot.  Jonah Blue, MD, FACP

## 2020-01-19 DIAGNOSIS — H539 Unspecified visual disturbance: Secondary | ICD-10-CM | POA: Diagnosis not present

## 2020-01-19 LAB — BRAIN NATRIURETIC PEPTIDE: BNP: 22.3 pg/mL (ref 0.0–100.0)

## 2020-01-20 DIAGNOSIS — E538 Deficiency of other specified B group vitamins: Secondary | ICD-10-CM | POA: Insufficient documentation

## 2020-01-20 DIAGNOSIS — H539 Unspecified visual disturbance: Secondary | ICD-10-CM | POA: Diagnosis not present

## 2020-01-20 DIAGNOSIS — E559 Vitamin D deficiency, unspecified: Secondary | ICD-10-CM | POA: Insufficient documentation

## 2020-01-21 ENCOUNTER — Telehealth: Payer: Self-pay

## 2020-01-21 DIAGNOSIS — H539 Unspecified visual disturbance: Secondary | ICD-10-CM | POA: Diagnosis not present

## 2020-01-21 NOTE — Telephone Encounter (Signed)
Contacted pt grandson and made aware that pt paperwork is ready for pick up and he will need to bring pt ID for completition of paperwork

## 2020-01-22 DIAGNOSIS — H539 Unspecified visual disturbance: Secondary | ICD-10-CM | POA: Diagnosis not present

## 2020-01-23 DIAGNOSIS — H539 Unspecified visual disturbance: Secondary | ICD-10-CM | POA: Diagnosis not present

## 2020-01-24 DIAGNOSIS — H539 Unspecified visual disturbance: Secondary | ICD-10-CM | POA: Diagnosis not present

## 2020-01-25 DIAGNOSIS — H539 Unspecified visual disturbance: Secondary | ICD-10-CM | POA: Diagnosis not present

## 2020-01-26 DIAGNOSIS — H539 Unspecified visual disturbance: Secondary | ICD-10-CM | POA: Diagnosis not present

## 2020-01-27 DIAGNOSIS — H539 Unspecified visual disturbance: Secondary | ICD-10-CM | POA: Diagnosis not present

## 2020-01-28 DIAGNOSIS — H539 Unspecified visual disturbance: Secondary | ICD-10-CM | POA: Diagnosis not present

## 2020-01-29 DIAGNOSIS — H539 Unspecified visual disturbance: Secondary | ICD-10-CM | POA: Diagnosis not present

## 2020-01-30 DIAGNOSIS — H539 Unspecified visual disturbance: Secondary | ICD-10-CM | POA: Diagnosis not present

## 2020-01-31 DIAGNOSIS — H539 Unspecified visual disturbance: Secondary | ICD-10-CM | POA: Diagnosis not present

## 2020-02-01 DIAGNOSIS — H539 Unspecified visual disturbance: Secondary | ICD-10-CM | POA: Diagnosis not present

## 2020-02-02 DIAGNOSIS — H539 Unspecified visual disturbance: Secondary | ICD-10-CM | POA: Diagnosis not present

## 2020-02-03 DIAGNOSIS — H539 Unspecified visual disturbance: Secondary | ICD-10-CM | POA: Diagnosis not present

## 2020-02-04 DIAGNOSIS — H539 Unspecified visual disturbance: Secondary | ICD-10-CM | POA: Diagnosis not present

## 2020-02-05 DIAGNOSIS — H539 Unspecified visual disturbance: Secondary | ICD-10-CM | POA: Diagnosis not present

## 2020-02-06 DIAGNOSIS — H539 Unspecified visual disturbance: Secondary | ICD-10-CM | POA: Diagnosis not present

## 2020-02-07 DIAGNOSIS — H539 Unspecified visual disturbance: Secondary | ICD-10-CM | POA: Diagnosis not present

## 2020-02-08 DIAGNOSIS — H539 Unspecified visual disturbance: Secondary | ICD-10-CM | POA: Diagnosis not present

## 2020-02-09 DIAGNOSIS — H539 Unspecified visual disturbance: Secondary | ICD-10-CM | POA: Diagnosis not present

## 2020-02-10 DIAGNOSIS — H539 Unspecified visual disturbance: Secondary | ICD-10-CM | POA: Diagnosis not present

## 2020-02-11 DIAGNOSIS — H539 Unspecified visual disturbance: Secondary | ICD-10-CM | POA: Diagnosis not present

## 2020-02-12 DIAGNOSIS — H539 Unspecified visual disturbance: Secondary | ICD-10-CM | POA: Diagnosis not present

## 2020-02-13 DIAGNOSIS — H539 Unspecified visual disturbance: Secondary | ICD-10-CM | POA: Diagnosis not present

## 2020-02-14 DIAGNOSIS — H539 Unspecified visual disturbance: Secondary | ICD-10-CM | POA: Diagnosis not present

## 2020-02-15 DIAGNOSIS — H539 Unspecified visual disturbance: Secondary | ICD-10-CM | POA: Diagnosis not present

## 2020-02-16 DIAGNOSIS — H539 Unspecified visual disturbance: Secondary | ICD-10-CM | POA: Diagnosis not present

## 2020-02-17 DIAGNOSIS — H539 Unspecified visual disturbance: Secondary | ICD-10-CM | POA: Diagnosis not present

## 2020-02-18 DIAGNOSIS — H539 Unspecified visual disturbance: Secondary | ICD-10-CM | POA: Diagnosis not present

## 2020-02-19 DIAGNOSIS — H539 Unspecified visual disturbance: Secondary | ICD-10-CM | POA: Diagnosis not present

## 2020-02-20 DIAGNOSIS — H539 Unspecified visual disturbance: Secondary | ICD-10-CM | POA: Diagnosis not present

## 2020-02-21 DIAGNOSIS — H539 Unspecified visual disturbance: Secondary | ICD-10-CM | POA: Diagnosis not present

## 2020-02-22 ENCOUNTER — Other Ambulatory Visit: Payer: Self-pay

## 2020-02-22 ENCOUNTER — Ambulatory Visit: Payer: Medicaid Other | Attending: Internal Medicine | Admitting: Internal Medicine

## 2020-02-22 ENCOUNTER — Encounter: Payer: Self-pay | Admitting: Internal Medicine

## 2020-02-22 VITALS — BP 127/73 | HR 68 | Temp 98.0°F | Resp 16 | Wt 103.0 lb

## 2020-02-22 DIAGNOSIS — E559 Vitamin D deficiency, unspecified: Secondary | ICD-10-CM | POA: Insufficient documentation

## 2020-02-22 DIAGNOSIS — F17210 Nicotine dependence, cigarettes, uncomplicated: Secondary | ICD-10-CM | POA: Insufficient documentation

## 2020-02-22 DIAGNOSIS — E538 Deficiency of other specified B group vitamins: Secondary | ICD-10-CM | POA: Insufficient documentation

## 2020-02-22 DIAGNOSIS — F0390 Unspecified dementia without behavioral disturbance: Secondary | ICD-10-CM | POA: Insufficient documentation

## 2020-02-22 DIAGNOSIS — Z79899 Other long term (current) drug therapy: Secondary | ICD-10-CM | POA: Diagnosis not present

## 2020-02-22 DIAGNOSIS — K219 Gastro-esophageal reflux disease without esophagitis: Secondary | ICD-10-CM | POA: Insufficient documentation

## 2020-02-22 DIAGNOSIS — I129 Hypertensive chronic kidney disease with stage 1 through stage 4 chronic kidney disease, or unspecified chronic kidney disease: Secondary | ICD-10-CM | POA: Insufficient documentation

## 2020-02-22 DIAGNOSIS — R600 Localized edema: Secondary | ICD-10-CM | POA: Diagnosis not present

## 2020-02-22 DIAGNOSIS — H539 Unspecified visual disturbance: Secondary | ICD-10-CM | POA: Diagnosis not present

## 2020-02-22 DIAGNOSIS — N189 Chronic kidney disease, unspecified: Secondary | ICD-10-CM | POA: Diagnosis not present

## 2020-02-22 DIAGNOSIS — H9190 Unspecified hearing loss, unspecified ear: Secondary | ICD-10-CM | POA: Insufficient documentation

## 2020-02-22 DIAGNOSIS — Z888 Allergy status to other drugs, medicaments and biological substances status: Secondary | ICD-10-CM | POA: Insufficient documentation

## 2020-02-22 MED ORDER — CYANOCOBALAMIN 1000 MCG/ML IJ SOLN
1000.0000 ug | INTRAMUSCULAR | Status: DC
  Administered 2020-02-22: 1000 ug via INTRAMUSCULAR

## 2020-02-22 NOTE — Progress Notes (Signed)
Patient ID: Martin Tanner, male    DOB: 05-May-1928  MRN: [id number]  CC: No chief complaint on file.   Subjective: Martin Tanner is a 86 y.o. male who presents for f/u LE edema and to ge B12 shot His concerns today include:  Patient with history of dementia, Vit B12 def, vit D def, HTN, GERD, functional urinary incontinence, fecal incontinence, decreased hearing.  Patient's grandson is with him and interprets.  On last visit with me patient was noted to have lower extremity edema that I thought was due to amlodipine.  I discontinued the amlodipine and started him on lisinopril instead.  Grandson reports that the swelling in the leg has subsided.  BNP was normal.  He is also here today to get his B12 shot which she will get once a month   Patient Active Problem List   Diagnosis Date Noted  . Vitamin D deficiency 01/20/2020  . Vitamin B12 deficiency 01/20/2020  . Essential hypertension 11/30/2019  . History of recent fall 11/30/2019  . Gastroesophageal reflux disease without esophagitis 11/30/2019  . Failure to thrive in adult 11/30/2019  . Functional urinary incontinence 11/30/2019  . Incontinence of feces 11/30/2019  . Dementia without behavioral disturbance ([place]) 11/30/2019  . Anemia due to chronic kidney disease 11/18/2017  . CKD (chronic kidney disease) 11/18/2017  . 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 Thrombocytopenia ([place]) 01/28/2013  . Urinary retention 01/25/2013  . Weakness generalized 01/25/2013     Current Outpatient Medications on File Prior to Visit  Medication Sig Dispense Refill  . lisinopril (ZESTRIL) 5 MG tablet Take 1 tablet (5 mg total) by mouth daily. 30 tablet 3  . meclizine (ANTIVERT) 12.5 MG tablet Take 1 tablet (12.5 mg total) by mouth daily as needed for nausea. 30 tablet 0  . Ergocalciferol (VITAMIN D2) 10 MCG (400 UNIT) TABS 1 tab daily (Patient not taking: Reported on 02/22/2020) 100 tablet 1  . feeding supplement, ENSURE ENLIVE, (ENSURE ENLIVE) LIQD Take 237  mLs by mouth daily. (Patient not taking: Reported on 02/22/2020) 237 mL 12  . omeprazole (PRILOSEC) 20 MG capsule Take 1 capsule (20 mg total) by mouth daily. (Patient not taking: Reported on 02/22/2020) 30 capsule 3  . vitamin B-12 (CYANOCOBALAMIN) 500 MCG tablet Take 2 tablets (1,000 mcg total) by mouth daily. (Patient not taking: Reported on 02/22/2020) 60 tablet 1   Current Facility-Administered Medications on File Prior to Visit  Medication Dose Route Frequency Provider Last Rate Last Admin  . cyanocobalamin ((VITAMIN B-12)) injection 1,000 mcg  1,000 mcg Intramuscular Q30 days Ladell Pier, MD   1,000 mcg at 01/18/20 1600    Allergies  Allergen Reactions  . Meat [Alpha-Gal] Other (See Comments)    Food preference, Pt is a Vegetarian... Dietary restrictions only.  Not for Medication purposes  . Norvasc [Amlodipine]     Social History   Socioeconomic History  . Marital status: Widowed    Spouse name: Not on file  . Number of children: Not on file  . Years of education: Not on file  . Highest education level: Not on file  Occupational History  . Not on file  Tobacco Use  . Smoking status: Current Every Day Smoker    Packs/day: 0.25    Years: 62.00    Pack years: 15.50    Types: Cigarettes  . Smokeless tobacco: Never Used  Substance and Sexual Activity  . Alcohol use: No    Alcohol/week: 0.0 standard drinks  . Drug use: No  .  Sexual activity: Never  Other Topics Concern  . Not on file  Social History Narrative  . Not on file   Social Determinants of Health   Financial Resource Strain:   . Difficulty of Paying Living Expenses: Not on file  Food Insecurity:   . Worried About Programme researcher, broadcasting/film/video in the Last Year: Not on file  . Ran Out of Food in the Last Year: Not on file  Transportation Needs:   . Lack of Transportation (Medical): Not on file  . Lack of Transportation (Non-Medical): Not on file  Physical Activity:   . Days of Exercise per Week: Not on file  .  Minutes of Exercise per Session: Not on file  Stress:   . Feeling of Stress : Not on file  Social Connections:   . Frequency of Communication with Friends and Family: Not on file  . Frequency of Social Gatherings with Friends and Family: Not on file  . Attends Religious Services: Not on file  . Active Member of Clubs or Organizations: Not on file  . Attends Banker Meetings: Not on file  . Marital Status: Not on file  Intimate Partner Violence:   . Fear of Current or Ex-Partner: Not on file  . Emotionally Abused: Not on file  . Physically Abused: Not on file  . Sexually Abused: Not on file    Family History  Problem Relation Age of Onset  . Other Other        Negative CAD  . Other Other        Negative DM2  . Other Other        Negative HTN  . Other Other        Negative Cancer    Past Surgical History:  Procedure Laterality Date  . ESOPHAGOGASTRODUODENOSCOPY (EGD) WITH PROPOFOL N/A 11/18/2017   Procedure: ESOPHAGOGASTRODUODENOSCOPY (EGD) WITH PROPOFOL;  Surgeon: Vida Rigger, MD;  Location: [facility] ENDOSCOPY;  Service: Endoscopy;  Laterality: N/A;  . None    . TRANSURETHRAL RESECTION OF PROSTATE N/A 03/16/2013   Procedure: TRANSURETHRAL RESECTION OF THE PROSTATE WITH GYRUS INSTRUMENTS;  Surgeon: Marcine Matar, MD;  Location: [facility] ORS;  Service: Urology;  Laterality: N/A;    ROS: Review of Systems Negative except as stated above  PHYSICAL EXAM: BP 127/73 (BP Location: Left Arm, Patient Position: Sitting)   Pulse 68   Temp 98 F (36.7 C)   Resp 16   Wt 103 lb (46.7 kg)   SpO2 96%   BMI 20.12 kg/m   Physical Exam General appearance - alert, elderly male in NAD  Mental status -patient is hard of hearing.  He answers some questions.   Extremities -trace lower extremity edema CMP Latest Ref Rng & Units 11/30/2019 05/17/2019 05/16/2019  Glucose 65 - 99 mg/dL 665(L) 935(T) 017(B)  BUN 8 - 27 mg/dL 17 93(J) 03(E)  Creatinine 0.76 - 1.27 mg/dL 0.92 3.30 0.76(A)    Sodium 134 - 144 mmol/L 143 141 141  Potassium 3.5 - 5.2 mmol/L 4.3 3.3(L) 3.8  Chloride 96 - 106 mmol/L 104 111 109  CO2 20 - 29 mmol/L 20 20(L) 21(L)  Calcium 8.6 - 10.2 mg/dL 9.3 2.6(J) 3.3(L)  Total Protein 6.0 - 8.5 g/dL 7.7 6.3(L) 6.7  Total Bilirubin 0.0 - 1.2 mg/dL 0.5 0.3 0.3  Alkaline Phos 39 - 117 IU/L 116 49 51  AST 0 - 40 IU/L 23 32 38  ALT 0 - 44 IU/L 15 23 23    Lipid Panel  Component Value Date/Time   CHOL 177 02/23/2014 1740   TRIG 92 05/13/2019 0527   HDL 40 02/23/2014 1740   CHOLHDL 4.4 02/23/2014 1740   VLDL 20 02/23/2014 1740   LDLCALC 117 (H) 02/23/2014 1740    CBC    Component Value Date/Time   WBC 5.5 11/30/2019 1203   WBC 4.6 05/17/2019 0532   RBC 4.65 11/30/2019 1203   RBC 3.58 (L) 05/17/2019 0532   HGB 13.5 11/30/2019 1203   HCT 39.7 11/30/2019 1203   PLT 114 (L) 05/17/2019 0532   MCV 85 11/30/2019 1203   MCH 29.0 11/30/2019 1203   MCH 31.3 05/17/2019 0532   MCHC 34.0 11/30/2019 1203   MCHC 35.0 05/17/2019 0532   RDW 13.4 11/30/2019 1203   LYMPHSABS 1.1 11/30/2019 1203   MONOABS 0.1 05/17/2019 0532   EOSABS 0.3 11/30/2019 1203   BASOSABS 0.1 11/30/2019 1203   BNP 22 ASSESSMENT AND PLAN:  1. Vitamin B12 deficiency Patient to receive B12 injection today and once a month. - cyanocobalamin ((VITAMIN B-12)) injection 1,000 mcg  2. Edema of both legs Significantly improved since Norvasc was discontinued.    Patient was given the opportunity to ask questions.  Patient verbalized understanding of the plan and was able to repeat key elements of the plan.   No orders of the defined types were placed in this encounter.    Requested Prescriptions    No prescriptions requested or ordered in this encounter    Return in about 4 months (around 06/23/2020).  Jonah Blue, MD, FACP

## 2020-02-22 NOTE — Patient Instructions (Signed)
Give nurse only visit in 1 month to get Vitamin B12 shot.

## 2020-02-22 NOTE — Progress Notes (Signed)
Pt is here with grandson for B12 injections as per Md order. "Start B12 inj.  Family may not be available to bring him every week for the next 3-4 wks so we will give inj today and Q mth.  Supplement with oral B12 b/w today and next inj which will be 1 mth from now"  Cyanocobalamin 1000 mcg/ml given via IM in Left Deltoid muscle. Injection well tolerated. No reaction noted Advised pt to  Schedule a nurse appt in a month/Vrerbalized understanding

## 2020-02-23 DIAGNOSIS — H539 Unspecified visual disturbance: Secondary | ICD-10-CM | POA: Diagnosis not present

## 2020-02-24 DIAGNOSIS — H539 Unspecified visual disturbance: Secondary | ICD-10-CM | POA: Diagnosis not present

## 2020-02-25 DIAGNOSIS — H539 Unspecified visual disturbance: Secondary | ICD-10-CM | POA: Diagnosis not present

## 2020-02-26 DIAGNOSIS — H539 Unspecified visual disturbance: Secondary | ICD-10-CM | POA: Diagnosis not present

## 2020-02-27 DIAGNOSIS — H539 Unspecified visual disturbance: Secondary | ICD-10-CM | POA: Diagnosis not present

## 2020-02-28 DIAGNOSIS — H539 Unspecified visual disturbance: Secondary | ICD-10-CM | POA: Diagnosis not present

## 2020-02-29 DIAGNOSIS — H539 Unspecified visual disturbance: Secondary | ICD-10-CM | POA: Diagnosis not present

## 2020-03-01 DIAGNOSIS — H539 Unspecified visual disturbance: Secondary | ICD-10-CM | POA: Diagnosis not present

## 2020-03-02 DIAGNOSIS — H539 Unspecified visual disturbance: Secondary | ICD-10-CM | POA: Diagnosis not present

## 2020-03-03 DIAGNOSIS — H539 Unspecified visual disturbance: Secondary | ICD-10-CM | POA: Diagnosis not present

## 2020-03-04 DIAGNOSIS — H539 Unspecified visual disturbance: Secondary | ICD-10-CM | POA: Diagnosis not present

## 2020-03-05 DIAGNOSIS — H539 Unspecified visual disturbance: Secondary | ICD-10-CM | POA: Diagnosis not present

## 2020-03-06 DIAGNOSIS — H539 Unspecified visual disturbance: Secondary | ICD-10-CM | POA: Diagnosis not present

## 2020-03-07 DIAGNOSIS — H539 Unspecified visual disturbance: Secondary | ICD-10-CM | POA: Diagnosis not present

## 2020-03-08 DIAGNOSIS — H539 Unspecified visual disturbance: Secondary | ICD-10-CM | POA: Diagnosis not present

## 2020-03-09 DIAGNOSIS — H539 Unspecified visual disturbance: Secondary | ICD-10-CM | POA: Diagnosis not present

## 2020-03-10 DIAGNOSIS — H539 Unspecified visual disturbance: Secondary | ICD-10-CM | POA: Diagnosis not present

## 2020-03-11 DIAGNOSIS — H539 Unspecified visual disturbance: Secondary | ICD-10-CM | POA: Diagnosis not present

## 2020-03-12 DIAGNOSIS — H539 Unspecified visual disturbance: Secondary | ICD-10-CM | POA: Diagnosis not present

## 2020-03-13 DIAGNOSIS — H539 Unspecified visual disturbance: Secondary | ICD-10-CM | POA: Diagnosis not present

## 2020-03-14 DIAGNOSIS — H539 Unspecified visual disturbance: Secondary | ICD-10-CM | POA: Diagnosis not present

## 2020-03-15 DIAGNOSIS — H539 Unspecified visual disturbance: Secondary | ICD-10-CM | POA: Diagnosis not present

## 2020-03-16 DIAGNOSIS — H539 Unspecified visual disturbance: Secondary | ICD-10-CM | POA: Diagnosis not present

## 2020-03-17 DIAGNOSIS — H539 Unspecified visual disturbance: Secondary | ICD-10-CM | POA: Diagnosis not present

## 2020-03-18 DIAGNOSIS — H539 Unspecified visual disturbance: Secondary | ICD-10-CM | POA: Diagnosis not present

## 2020-03-19 DIAGNOSIS — H539 Unspecified visual disturbance: Secondary | ICD-10-CM | POA: Diagnosis not present

## 2020-03-20 DIAGNOSIS — H539 Unspecified visual disturbance: Secondary | ICD-10-CM | POA: Diagnosis not present

## 2020-03-21 DIAGNOSIS — H539 Unspecified visual disturbance: Secondary | ICD-10-CM | POA: Diagnosis not present

## 2020-03-22 DIAGNOSIS — H539 Unspecified visual disturbance: Secondary | ICD-10-CM | POA: Diagnosis not present

## 2020-03-23 DIAGNOSIS — H539 Unspecified visual disturbance: Secondary | ICD-10-CM | POA: Diagnosis not present

## 2020-03-24 DIAGNOSIS — H539 Unspecified visual disturbance: Secondary | ICD-10-CM | POA: Diagnosis not present

## 2020-03-25 DIAGNOSIS — H539 Unspecified visual disturbance: Secondary | ICD-10-CM | POA: Diagnosis not present

## 2020-03-26 DIAGNOSIS — H539 Unspecified visual disturbance: Secondary | ICD-10-CM | POA: Diagnosis not present

## 2020-03-27 DIAGNOSIS — H539 Unspecified visual disturbance: Secondary | ICD-10-CM | POA: Diagnosis not present

## 2020-03-28 ENCOUNTER — Ambulatory Visit: Payer: Medicaid Other

## 2020-03-28 DIAGNOSIS — H539 Unspecified visual disturbance: Secondary | ICD-10-CM | POA: Diagnosis not present

## 2020-03-29 DIAGNOSIS — H539 Unspecified visual disturbance: Secondary | ICD-10-CM | POA: Diagnosis not present

## 2020-03-30 DIAGNOSIS — H539 Unspecified visual disturbance: Secondary | ICD-10-CM | POA: Diagnosis not present

## 2020-03-31 DIAGNOSIS — H539 Unspecified visual disturbance: Secondary | ICD-10-CM | POA: Diagnosis not present

## 2020-04-01 DIAGNOSIS — H539 Unspecified visual disturbance: Secondary | ICD-10-CM | POA: Diagnosis not present

## 2020-04-02 DIAGNOSIS — H539 Unspecified visual disturbance: Secondary | ICD-10-CM | POA: Diagnosis not present

## 2020-04-03 DIAGNOSIS — H539 Unspecified visual disturbance: Secondary | ICD-10-CM | POA: Diagnosis not present

## 2020-04-04 DIAGNOSIS — H539 Unspecified visual disturbance: Secondary | ICD-10-CM | POA: Diagnosis not present

## 2020-04-05 DIAGNOSIS — H539 Unspecified visual disturbance: Secondary | ICD-10-CM | POA: Diagnosis not present

## 2020-04-06 DIAGNOSIS — H539 Unspecified visual disturbance: Secondary | ICD-10-CM | POA: Diagnosis not present

## 2020-04-07 DIAGNOSIS — H539 Unspecified visual disturbance: Secondary | ICD-10-CM | POA: Diagnosis not present

## 2020-04-08 DIAGNOSIS — H539 Unspecified visual disturbance: Secondary | ICD-10-CM | POA: Diagnosis not present

## 2020-04-09 DIAGNOSIS — H539 Unspecified visual disturbance: Secondary | ICD-10-CM | POA: Diagnosis not present

## 2020-04-10 DIAGNOSIS — H539 Unspecified visual disturbance: Secondary | ICD-10-CM | POA: Diagnosis not present

## 2020-04-11 DIAGNOSIS — H539 Unspecified visual disturbance: Secondary | ICD-10-CM | POA: Diagnosis not present

## 2020-04-12 DIAGNOSIS — H539 Unspecified visual disturbance: Secondary | ICD-10-CM | POA: Diagnosis not present

## 2020-04-13 DIAGNOSIS — H539 Unspecified visual disturbance: Secondary | ICD-10-CM | POA: Diagnosis not present

## 2020-04-14 DIAGNOSIS — H539 Unspecified visual disturbance: Secondary | ICD-10-CM | POA: Diagnosis not present

## 2020-04-15 DIAGNOSIS — H539 Unspecified visual disturbance: Secondary | ICD-10-CM | POA: Diagnosis not present

## 2020-04-16 DIAGNOSIS — H539 Unspecified visual disturbance: Secondary | ICD-10-CM | POA: Diagnosis not present

## 2020-04-17 DIAGNOSIS — H539 Unspecified visual disturbance: Secondary | ICD-10-CM | POA: Diagnosis not present

## 2020-04-18 DIAGNOSIS — H539 Unspecified visual disturbance: Secondary | ICD-10-CM | POA: Diagnosis not present

## 2020-04-19 DIAGNOSIS — H539 Unspecified visual disturbance: Secondary | ICD-10-CM | POA: Diagnosis not present

## 2020-04-20 DIAGNOSIS — H539 Unspecified visual disturbance: Secondary | ICD-10-CM | POA: Diagnosis not present

## 2020-04-21 DIAGNOSIS — H539 Unspecified visual disturbance: Secondary | ICD-10-CM | POA: Diagnosis not present

## 2020-04-22 DIAGNOSIS — H539 Unspecified visual disturbance: Secondary | ICD-10-CM | POA: Diagnosis not present

## 2020-04-23 DIAGNOSIS — H539 Unspecified visual disturbance: Secondary | ICD-10-CM | POA: Diagnosis not present

## 2020-04-24 DIAGNOSIS — H539 Unspecified visual disturbance: Secondary | ICD-10-CM | POA: Diagnosis not present

## 2020-04-25 DIAGNOSIS — H539 Unspecified visual disturbance: Secondary | ICD-10-CM | POA: Diagnosis not present

## 2020-04-26 DIAGNOSIS — H539 Unspecified visual disturbance: Secondary | ICD-10-CM | POA: Diagnosis not present

## 2020-04-27 DIAGNOSIS — H539 Unspecified visual disturbance: Secondary | ICD-10-CM | POA: Diagnosis not present

## 2020-04-28 DIAGNOSIS — H539 Unspecified visual disturbance: Secondary | ICD-10-CM | POA: Diagnosis not present

## 2020-04-29 DIAGNOSIS — H539 Unspecified visual disturbance: Secondary | ICD-10-CM | POA: Diagnosis not present

## 2020-04-30 DIAGNOSIS — H539 Unspecified visual disturbance: Secondary | ICD-10-CM | POA: Diagnosis not present

## 2020-05-01 DIAGNOSIS — H539 Unspecified visual disturbance: Secondary | ICD-10-CM | POA: Diagnosis not present

## 2020-05-02 DIAGNOSIS — H539 Unspecified visual disturbance: Secondary | ICD-10-CM | POA: Diagnosis not present

## 2020-05-03 DIAGNOSIS — H539 Unspecified visual disturbance: Secondary | ICD-10-CM | POA: Diagnosis not present

## 2020-05-04 DIAGNOSIS — H539 Unspecified visual disturbance: Secondary | ICD-10-CM | POA: Diagnosis not present

## 2020-05-05 DIAGNOSIS — H539 Unspecified visual disturbance: Secondary | ICD-10-CM | POA: Diagnosis not present

## 2020-05-06 DIAGNOSIS — H539 Unspecified visual disturbance: Secondary | ICD-10-CM | POA: Diagnosis not present

## 2020-05-07 DIAGNOSIS — H539 Unspecified visual disturbance: Secondary | ICD-10-CM | POA: Diagnosis not present

## 2020-05-08 DIAGNOSIS — H539 Unspecified visual disturbance: Secondary | ICD-10-CM | POA: Diagnosis not present

## 2020-05-09 DIAGNOSIS — H539 Unspecified visual disturbance: Secondary | ICD-10-CM | POA: Diagnosis not present

## 2020-05-10 DIAGNOSIS — H539 Unspecified visual disturbance: Secondary | ICD-10-CM | POA: Diagnosis not present

## 2020-05-11 DIAGNOSIS — H539 Unspecified visual disturbance: Secondary | ICD-10-CM | POA: Diagnosis not present

## 2020-05-12 DIAGNOSIS — H539 Unspecified visual disturbance: Secondary | ICD-10-CM | POA: Diagnosis not present

## 2020-05-13 DIAGNOSIS — H539 Unspecified visual disturbance: Secondary | ICD-10-CM | POA: Diagnosis not present

## 2020-05-14 DIAGNOSIS — H539 Unspecified visual disturbance: Secondary | ICD-10-CM | POA: Diagnosis not present

## 2020-05-15 DIAGNOSIS — H539 Unspecified visual disturbance: Secondary | ICD-10-CM | POA: Diagnosis not present

## 2020-05-16 DIAGNOSIS — H539 Unspecified visual disturbance: Secondary | ICD-10-CM | POA: Diagnosis not present

## 2020-05-17 DIAGNOSIS — H539 Unspecified visual disturbance: Secondary | ICD-10-CM | POA: Diagnosis not present

## 2020-05-18 DIAGNOSIS — H539 Unspecified visual disturbance: Secondary | ICD-10-CM | POA: Diagnosis not present

## 2020-05-19 DIAGNOSIS — H539 Unspecified visual disturbance: Secondary | ICD-10-CM | POA: Diagnosis not present

## 2020-05-20 DIAGNOSIS — H539 Unspecified visual disturbance: Secondary | ICD-10-CM | POA: Diagnosis not present

## 2020-05-21 DIAGNOSIS — H539 Unspecified visual disturbance: Secondary | ICD-10-CM | POA: Diagnosis not present

## 2020-05-22 DIAGNOSIS — H539 Unspecified visual disturbance: Secondary | ICD-10-CM | POA: Diagnosis not present

## 2020-05-23 DIAGNOSIS — H539 Unspecified visual disturbance: Secondary | ICD-10-CM | POA: Diagnosis not present

## 2020-05-24 DIAGNOSIS — H539 Unspecified visual disturbance: Secondary | ICD-10-CM | POA: Diagnosis not present

## 2020-05-25 DIAGNOSIS — H539 Unspecified visual disturbance: Secondary | ICD-10-CM | POA: Diagnosis not present

## 2020-05-26 DIAGNOSIS — H539 Unspecified visual disturbance: Secondary | ICD-10-CM | POA: Diagnosis not present

## 2020-05-27 DIAGNOSIS — H539 Unspecified visual disturbance: Secondary | ICD-10-CM | POA: Diagnosis not present

## 2020-05-28 DIAGNOSIS — H539 Unspecified visual disturbance: Secondary | ICD-10-CM | POA: Diagnosis not present

## 2020-05-29 DIAGNOSIS — H539 Unspecified visual disturbance: Secondary | ICD-10-CM | POA: Diagnosis not present

## 2020-05-30 DIAGNOSIS — H539 Unspecified visual disturbance: Secondary | ICD-10-CM | POA: Diagnosis not present

## 2020-05-31 DIAGNOSIS — H539 Unspecified visual disturbance: Secondary | ICD-10-CM | POA: Diagnosis not present

## 2020-06-01 DIAGNOSIS — H539 Unspecified visual disturbance: Secondary | ICD-10-CM | POA: Diagnosis not present

## 2020-06-02 DIAGNOSIS — H539 Unspecified visual disturbance: Secondary | ICD-10-CM | POA: Diagnosis not present

## 2020-06-03 DIAGNOSIS — H539 Unspecified visual disturbance: Secondary | ICD-10-CM | POA: Diagnosis not present

## 2020-06-04 DIAGNOSIS — H539 Unspecified visual disturbance: Secondary | ICD-10-CM | POA: Diagnosis not present

## 2020-06-05 DIAGNOSIS — H539 Unspecified visual disturbance: Secondary | ICD-10-CM | POA: Diagnosis not present

## 2020-06-06 DIAGNOSIS — H539 Unspecified visual disturbance: Secondary | ICD-10-CM | POA: Diagnosis not present

## 2020-06-07 DIAGNOSIS — H539 Unspecified visual disturbance: Secondary | ICD-10-CM | POA: Diagnosis not present

## 2020-06-08 DIAGNOSIS — H539 Unspecified visual disturbance: Secondary | ICD-10-CM | POA: Diagnosis not present

## 2020-06-09 DIAGNOSIS — H539 Unspecified visual disturbance: Secondary | ICD-10-CM | POA: Diagnosis not present

## 2020-06-10 DIAGNOSIS — H539 Unspecified visual disturbance: Secondary | ICD-10-CM | POA: Diagnosis not present

## 2020-06-11 DIAGNOSIS — H539 Unspecified visual disturbance: Secondary | ICD-10-CM | POA: Diagnosis not present

## 2020-06-12 DIAGNOSIS — H539 Unspecified visual disturbance: Secondary | ICD-10-CM | POA: Diagnosis not present

## 2020-06-13 DIAGNOSIS — H539 Unspecified visual disturbance: Secondary | ICD-10-CM | POA: Diagnosis not present

## 2020-06-14 DIAGNOSIS — H539 Unspecified visual disturbance: Secondary | ICD-10-CM | POA: Diagnosis not present

## 2020-06-15 DIAGNOSIS — H539 Unspecified visual disturbance: Secondary | ICD-10-CM | POA: Diagnosis not present

## 2020-06-16 DIAGNOSIS — H539 Unspecified visual disturbance: Secondary | ICD-10-CM | POA: Diagnosis not present

## 2020-06-17 DIAGNOSIS — H539 Unspecified visual disturbance: Secondary | ICD-10-CM | POA: Diagnosis not present

## 2020-06-18 DIAGNOSIS — H539 Unspecified visual disturbance: Secondary | ICD-10-CM | POA: Diagnosis not present

## 2020-06-19 DIAGNOSIS — H539 Unspecified visual disturbance: Secondary | ICD-10-CM | POA: Diagnosis not present

## 2020-06-20 DIAGNOSIS — H539 Unspecified visual disturbance: Secondary | ICD-10-CM | POA: Diagnosis not present

## 2020-06-21 DIAGNOSIS — H539 Unspecified visual disturbance: Secondary | ICD-10-CM | POA: Diagnosis not present

## 2020-06-22 DIAGNOSIS — H539 Unspecified visual disturbance: Secondary | ICD-10-CM | POA: Diagnosis not present

## 2020-06-30 ENCOUNTER — Ambulatory Visit: Payer: Medicaid Other | Admitting: Internal Medicine

## 2020-08-15 DIAGNOSIS — H539 Unspecified visual disturbance: Secondary | ICD-10-CM | POA: Diagnosis not present

## 2020-08-16 DIAGNOSIS — H539 Unspecified visual disturbance: Secondary | ICD-10-CM | POA: Diagnosis not present

## 2020-08-17 DIAGNOSIS — H539 Unspecified visual disturbance: Secondary | ICD-10-CM | POA: Diagnosis not present

## 2020-08-18 DIAGNOSIS — H539 Unspecified visual disturbance: Secondary | ICD-10-CM | POA: Diagnosis not present

## 2020-08-19 DIAGNOSIS — H539 Unspecified visual disturbance: Secondary | ICD-10-CM | POA: Diagnosis not present

## 2020-08-20 DIAGNOSIS — H539 Unspecified visual disturbance: Secondary | ICD-10-CM | POA: Diagnosis not present

## 2020-08-21 DIAGNOSIS — H539 Unspecified visual disturbance: Secondary | ICD-10-CM | POA: Diagnosis not present

## 2020-08-24 DIAGNOSIS — H539 Unspecified visual disturbance: Secondary | ICD-10-CM | POA: Diagnosis not present

## 2020-08-25 DIAGNOSIS — H539 Unspecified visual disturbance: Secondary | ICD-10-CM | POA: Diagnosis not present

## 2020-08-26 DIAGNOSIS — H539 Unspecified visual disturbance: Secondary | ICD-10-CM | POA: Diagnosis not present

## 2020-08-27 DIAGNOSIS — H539 Unspecified visual disturbance: Secondary | ICD-10-CM | POA: Diagnosis not present

## 2020-08-28 DIAGNOSIS — H539 Unspecified visual disturbance: Secondary | ICD-10-CM | POA: Diagnosis not present

## 2020-08-29 DIAGNOSIS — H539 Unspecified visual disturbance: Secondary | ICD-10-CM | POA: Diagnosis not present

## 2020-08-30 DIAGNOSIS — H539 Unspecified visual disturbance: Secondary | ICD-10-CM | POA: Diagnosis not present

## 2020-08-31 DIAGNOSIS — H539 Unspecified visual disturbance: Secondary | ICD-10-CM | POA: Diagnosis not present

## 2020-09-01 DIAGNOSIS — H539 Unspecified visual disturbance: Secondary | ICD-10-CM | POA: Diagnosis not present

## 2020-09-02 DIAGNOSIS — H539 Unspecified visual disturbance: Secondary | ICD-10-CM | POA: Diagnosis not present

## 2020-09-03 DIAGNOSIS — H539 Unspecified visual disturbance: Secondary | ICD-10-CM | POA: Diagnosis not present

## 2020-09-04 DIAGNOSIS — H539 Unspecified visual disturbance: Secondary | ICD-10-CM | POA: Diagnosis not present

## 2020-09-05 DIAGNOSIS — H539 Unspecified visual disturbance: Secondary | ICD-10-CM | POA: Diagnosis not present

## 2020-09-06 DIAGNOSIS — H539 Unspecified visual disturbance: Secondary | ICD-10-CM | POA: Diagnosis not present

## 2020-09-07 DIAGNOSIS — H539 Unspecified visual disturbance: Secondary | ICD-10-CM | POA: Diagnosis not present

## 2020-09-08 DIAGNOSIS — H539 Unspecified visual disturbance: Secondary | ICD-10-CM | POA: Diagnosis not present

## 2020-09-09 DIAGNOSIS — H539 Unspecified visual disturbance: Secondary | ICD-10-CM | POA: Diagnosis not present

## 2020-09-10 DIAGNOSIS — H539 Unspecified visual disturbance: Secondary | ICD-10-CM | POA: Diagnosis not present

## 2020-09-11 DIAGNOSIS — H539 Unspecified visual disturbance: Secondary | ICD-10-CM | POA: Diagnosis not present

## 2020-09-12 ENCOUNTER — Ambulatory Visit: Payer: Medicaid Other | Admitting: Internal Medicine

## 2020-09-12 DIAGNOSIS — H539 Unspecified visual disturbance: Secondary | ICD-10-CM | POA: Diagnosis not present

## 2020-09-13 DIAGNOSIS — H539 Unspecified visual disturbance: Secondary | ICD-10-CM | POA: Diagnosis not present

## 2020-09-14 DIAGNOSIS — H539 Unspecified visual disturbance: Secondary | ICD-10-CM | POA: Diagnosis not present

## 2020-09-15 DIAGNOSIS — H539 Unspecified visual disturbance: Secondary | ICD-10-CM | POA: Diagnosis not present

## 2020-09-16 DIAGNOSIS — H539 Unspecified visual disturbance: Secondary | ICD-10-CM | POA: Diagnosis not present

## 2020-09-17 DIAGNOSIS — H539 Unspecified visual disturbance: Secondary | ICD-10-CM | POA: Diagnosis not present

## 2020-09-18 DIAGNOSIS — H539 Unspecified visual disturbance: Secondary | ICD-10-CM | POA: Diagnosis not present

## 2020-09-19 DIAGNOSIS — H539 Unspecified visual disturbance: Secondary | ICD-10-CM | POA: Diagnosis not present

## 2020-09-20 DIAGNOSIS — H539 Unspecified visual disturbance: Secondary | ICD-10-CM | POA: Diagnosis not present

## 2020-09-21 DIAGNOSIS — H539 Unspecified visual disturbance: Secondary | ICD-10-CM | POA: Diagnosis not present

## 2020-09-22 DIAGNOSIS — H539 Unspecified visual disturbance: Secondary | ICD-10-CM | POA: Diagnosis not present

## 2020-09-23 DIAGNOSIS — H539 Unspecified visual disturbance: Secondary | ICD-10-CM | POA: Diagnosis not present

## 2020-09-24 DIAGNOSIS — H539 Unspecified visual disturbance: Secondary | ICD-10-CM | POA: Diagnosis not present

## 2020-09-25 DIAGNOSIS — H539 Unspecified visual disturbance: Secondary | ICD-10-CM | POA: Diagnosis not present

## 2020-09-26 DIAGNOSIS — H539 Unspecified visual disturbance: Secondary | ICD-10-CM | POA: Diagnosis not present

## 2020-09-27 DIAGNOSIS — H539 Unspecified visual disturbance: Secondary | ICD-10-CM | POA: Diagnosis not present

## 2020-09-28 DIAGNOSIS — H539 Unspecified visual disturbance: Secondary | ICD-10-CM | POA: Diagnosis not present

## 2020-10-03 DIAGNOSIS — H539 Unspecified visual disturbance: Secondary | ICD-10-CM | POA: Diagnosis not present

## 2020-10-04 DIAGNOSIS — H539 Unspecified visual disturbance: Secondary | ICD-10-CM | POA: Diagnosis not present

## 2020-10-05 DIAGNOSIS — H539 Unspecified visual disturbance: Secondary | ICD-10-CM | POA: Diagnosis not present

## 2020-10-06 DIAGNOSIS — H539 Unspecified visual disturbance: Secondary | ICD-10-CM | POA: Diagnosis not present

## 2020-10-07 DIAGNOSIS — H539 Unspecified visual disturbance: Secondary | ICD-10-CM | POA: Diagnosis not present

## 2020-10-08 DIAGNOSIS — H539 Unspecified visual disturbance: Secondary | ICD-10-CM | POA: Diagnosis not present

## 2020-10-09 DIAGNOSIS — H539 Unspecified visual disturbance: Secondary | ICD-10-CM | POA: Diagnosis not present

## 2020-10-10 DIAGNOSIS — H539 Unspecified visual disturbance: Secondary | ICD-10-CM | POA: Diagnosis not present

## 2020-10-11 DIAGNOSIS — H539 Unspecified visual disturbance: Secondary | ICD-10-CM | POA: Diagnosis not present

## 2020-10-12 DIAGNOSIS — H539 Unspecified visual disturbance: Secondary | ICD-10-CM | POA: Diagnosis not present

## 2020-10-13 DIAGNOSIS — H539 Unspecified visual disturbance: Secondary | ICD-10-CM | POA: Diagnosis not present

## 2020-10-14 DIAGNOSIS — H539 Unspecified visual disturbance: Secondary | ICD-10-CM | POA: Diagnosis not present

## 2020-10-15 DIAGNOSIS — H539 Unspecified visual disturbance: Secondary | ICD-10-CM | POA: Diagnosis not present

## 2020-10-16 DIAGNOSIS — H539 Unspecified visual disturbance: Secondary | ICD-10-CM | POA: Diagnosis not present

## 2020-10-17 DIAGNOSIS — H539 Unspecified visual disturbance: Secondary | ICD-10-CM | POA: Diagnosis not present

## 2020-10-18 DIAGNOSIS — H539 Unspecified visual disturbance: Secondary | ICD-10-CM | POA: Diagnosis not present

## 2020-10-19 DIAGNOSIS — H539 Unspecified visual disturbance: Secondary | ICD-10-CM | POA: Diagnosis not present

## 2020-10-20 DIAGNOSIS — H539 Unspecified visual disturbance: Secondary | ICD-10-CM | POA: Diagnosis not present

## 2020-10-21 DIAGNOSIS — H539 Unspecified visual disturbance: Secondary | ICD-10-CM | POA: Diagnosis not present

## 2020-10-22 DIAGNOSIS — H539 Unspecified visual disturbance: Secondary | ICD-10-CM | POA: Diagnosis not present

## 2020-10-23 DIAGNOSIS — H539 Unspecified visual disturbance: Secondary | ICD-10-CM | POA: Diagnosis not present

## 2020-10-24 DIAGNOSIS — H539 Unspecified visual disturbance: Secondary | ICD-10-CM | POA: Diagnosis not present

## 2020-10-25 DIAGNOSIS — H539 Unspecified visual disturbance: Secondary | ICD-10-CM | POA: Diagnosis not present

## 2020-10-26 DIAGNOSIS — H539 Unspecified visual disturbance: Secondary | ICD-10-CM | POA: Diagnosis not present

## 2020-10-27 DIAGNOSIS — H539 Unspecified visual disturbance: Secondary | ICD-10-CM | POA: Diagnosis not present

## 2020-10-28 DIAGNOSIS — H539 Unspecified visual disturbance: Secondary | ICD-10-CM | POA: Diagnosis not present

## 2020-10-29 DIAGNOSIS — H539 Unspecified visual disturbance: Secondary | ICD-10-CM | POA: Diagnosis not present

## 2020-10-30 DIAGNOSIS — H539 Unspecified visual disturbance: Secondary | ICD-10-CM | POA: Diagnosis not present

## 2020-10-31 DIAGNOSIS — H539 Unspecified visual disturbance: Secondary | ICD-10-CM | POA: Diagnosis not present

## 2020-11-01 DIAGNOSIS — H539 Unspecified visual disturbance: Secondary | ICD-10-CM | POA: Diagnosis not present

## 2020-11-02 DIAGNOSIS — H539 Unspecified visual disturbance: Secondary | ICD-10-CM | POA: Diagnosis not present

## 2020-11-03 DIAGNOSIS — H539 Unspecified visual disturbance: Secondary | ICD-10-CM | POA: Diagnosis not present

## 2020-11-04 DIAGNOSIS — H539 Unspecified visual disturbance: Secondary | ICD-10-CM | POA: Diagnosis not present

## 2020-11-05 DIAGNOSIS — H539 Unspecified visual disturbance: Secondary | ICD-10-CM | POA: Diagnosis not present

## 2020-11-06 DIAGNOSIS — H539 Unspecified visual disturbance: Secondary | ICD-10-CM | POA: Diagnosis not present

## 2020-11-07 DIAGNOSIS — H539 Unspecified visual disturbance: Secondary | ICD-10-CM | POA: Diagnosis not present

## 2020-11-08 DIAGNOSIS — H539 Unspecified visual disturbance: Secondary | ICD-10-CM | POA: Diagnosis not present

## 2020-11-09 DIAGNOSIS — H539 Unspecified visual disturbance: Secondary | ICD-10-CM | POA: Diagnosis not present

## 2020-11-10 DIAGNOSIS — H539 Unspecified visual disturbance: Secondary | ICD-10-CM | POA: Diagnosis not present

## 2020-11-11 DIAGNOSIS — H539 Unspecified visual disturbance: Secondary | ICD-10-CM | POA: Diagnosis not present

## 2020-11-12 DIAGNOSIS — H539 Unspecified visual disturbance: Secondary | ICD-10-CM | POA: Diagnosis not present

## 2020-11-13 DIAGNOSIS — H539 Unspecified visual disturbance: Secondary | ICD-10-CM | POA: Diagnosis not present

## 2020-11-14 DIAGNOSIS — H539 Unspecified visual disturbance: Secondary | ICD-10-CM | POA: Diagnosis not present

## 2020-11-15 DIAGNOSIS — H539 Unspecified visual disturbance: Secondary | ICD-10-CM | POA: Diagnosis not present

## 2020-11-16 DIAGNOSIS — H539 Unspecified visual disturbance: Secondary | ICD-10-CM | POA: Diagnosis not present

## 2020-11-17 DIAGNOSIS — H539 Unspecified visual disturbance: Secondary | ICD-10-CM | POA: Diagnosis not present

## 2020-11-18 DIAGNOSIS — H539 Unspecified visual disturbance: Secondary | ICD-10-CM | POA: Diagnosis not present

## 2020-11-19 DIAGNOSIS — H539 Unspecified visual disturbance: Secondary | ICD-10-CM | POA: Diagnosis not present

## 2020-11-20 DIAGNOSIS — H539 Unspecified visual disturbance: Secondary | ICD-10-CM | POA: Diagnosis not present

## 2020-11-21 DIAGNOSIS — H539 Unspecified visual disturbance: Secondary | ICD-10-CM | POA: Diagnosis not present

## 2020-11-22 DIAGNOSIS — H539 Unspecified visual disturbance: Secondary | ICD-10-CM | POA: Diagnosis not present

## 2020-11-23 DIAGNOSIS — H539 Unspecified visual disturbance: Secondary | ICD-10-CM | POA: Diagnosis not present

## 2020-11-24 DIAGNOSIS — H539 Unspecified visual disturbance: Secondary | ICD-10-CM | POA: Diagnosis not present

## 2020-11-25 DIAGNOSIS — H539 Unspecified visual disturbance: Secondary | ICD-10-CM | POA: Diagnosis not present

## 2020-11-26 DIAGNOSIS — H539 Unspecified visual disturbance: Secondary | ICD-10-CM | POA: Diagnosis not present

## 2020-11-27 DIAGNOSIS — H539 Unspecified visual disturbance: Secondary | ICD-10-CM | POA: Diagnosis not present

## 2020-11-28 DIAGNOSIS — H539 Unspecified visual disturbance: Secondary | ICD-10-CM | POA: Diagnosis not present

## 2020-11-29 DIAGNOSIS — H539 Unspecified visual disturbance: Secondary | ICD-10-CM | POA: Diagnosis not present

## 2020-11-30 DIAGNOSIS — H539 Unspecified visual disturbance: Secondary | ICD-10-CM | POA: Diagnosis not present

## 2020-12-01 DIAGNOSIS — H539 Unspecified visual disturbance: Secondary | ICD-10-CM | POA: Diagnosis not present

## 2020-12-02 DIAGNOSIS — H539 Unspecified visual disturbance: Secondary | ICD-10-CM | POA: Diagnosis not present

## 2020-12-03 DIAGNOSIS — H539 Unspecified visual disturbance: Secondary | ICD-10-CM | POA: Diagnosis not present

## 2020-12-04 DIAGNOSIS — H539 Unspecified visual disturbance: Secondary | ICD-10-CM | POA: Diagnosis not present

## 2020-12-05 DIAGNOSIS — H539 Unspecified visual disturbance: Secondary | ICD-10-CM | POA: Diagnosis not present

## 2020-12-06 DIAGNOSIS — H539 Unspecified visual disturbance: Secondary | ICD-10-CM | POA: Diagnosis not present

## 2020-12-07 DIAGNOSIS — H539 Unspecified visual disturbance: Secondary | ICD-10-CM | POA: Diagnosis not present

## 2020-12-08 DIAGNOSIS — H539 Unspecified visual disturbance: Secondary | ICD-10-CM | POA: Diagnosis not present

## 2020-12-09 DIAGNOSIS — H539 Unspecified visual disturbance: Secondary | ICD-10-CM | POA: Diagnosis not present

## 2020-12-10 DIAGNOSIS — H539 Unspecified visual disturbance: Secondary | ICD-10-CM | POA: Diagnosis not present

## 2020-12-11 DIAGNOSIS — H539 Unspecified visual disturbance: Secondary | ICD-10-CM | POA: Diagnosis not present

## 2020-12-12 DIAGNOSIS — H539 Unspecified visual disturbance: Secondary | ICD-10-CM | POA: Diagnosis not present

## 2020-12-13 DIAGNOSIS — H539 Unspecified visual disturbance: Secondary | ICD-10-CM | POA: Diagnosis not present

## 2020-12-14 DIAGNOSIS — H539 Unspecified visual disturbance: Secondary | ICD-10-CM | POA: Diagnosis not present

## 2020-12-15 DIAGNOSIS — H539 Unspecified visual disturbance: Secondary | ICD-10-CM | POA: Diagnosis not present

## 2020-12-16 DIAGNOSIS — H539 Unspecified visual disturbance: Secondary | ICD-10-CM | POA: Diagnosis not present

## 2020-12-17 DIAGNOSIS — H539 Unspecified visual disturbance: Secondary | ICD-10-CM | POA: Diagnosis not present

## 2020-12-18 DIAGNOSIS — H539 Unspecified visual disturbance: Secondary | ICD-10-CM | POA: Diagnosis not present

## 2020-12-19 DIAGNOSIS — H539 Unspecified visual disturbance: Secondary | ICD-10-CM | POA: Diagnosis not present

## 2020-12-20 DIAGNOSIS — H539 Unspecified visual disturbance: Secondary | ICD-10-CM | POA: Diagnosis not present

## 2020-12-21 DIAGNOSIS — H539 Unspecified visual disturbance: Secondary | ICD-10-CM | POA: Diagnosis not present

## 2020-12-22 DIAGNOSIS — H539 Unspecified visual disturbance: Secondary | ICD-10-CM | POA: Diagnosis not present

## 2020-12-23 DIAGNOSIS — H539 Unspecified visual disturbance: Secondary | ICD-10-CM | POA: Diagnosis not present

## 2020-12-24 DIAGNOSIS — H539 Unspecified visual disturbance: Secondary | ICD-10-CM | POA: Diagnosis not present

## 2020-12-25 DIAGNOSIS — H539 Unspecified visual disturbance: Secondary | ICD-10-CM | POA: Diagnosis not present

## 2020-12-26 DIAGNOSIS — H539 Unspecified visual disturbance: Secondary | ICD-10-CM | POA: Diagnosis not present

## 2020-12-27 DIAGNOSIS — H539 Unspecified visual disturbance: Secondary | ICD-10-CM | POA: Diagnosis not present

## 2020-12-28 DIAGNOSIS — H539 Unspecified visual disturbance: Secondary | ICD-10-CM | POA: Diagnosis not present

## 2020-12-29 DIAGNOSIS — H539 Unspecified visual disturbance: Secondary | ICD-10-CM | POA: Diagnosis not present

## 2020-12-30 DIAGNOSIS — H539 Unspecified visual disturbance: Secondary | ICD-10-CM | POA: Diagnosis not present

## 2020-12-31 DIAGNOSIS — H539 Unspecified visual disturbance: Secondary | ICD-10-CM | POA: Diagnosis not present

## 2021-01-01 DIAGNOSIS — H539 Unspecified visual disturbance: Secondary | ICD-10-CM | POA: Diagnosis not present

## 2021-01-02 DIAGNOSIS — H539 Unspecified visual disturbance: Secondary | ICD-10-CM | POA: Diagnosis not present

## 2021-01-03 DIAGNOSIS — H539 Unspecified visual disturbance: Secondary | ICD-10-CM | POA: Diagnosis not present

## 2021-01-04 DIAGNOSIS — H539 Unspecified visual disturbance: Secondary | ICD-10-CM | POA: Diagnosis not present

## 2021-01-05 DIAGNOSIS — H539 Unspecified visual disturbance: Secondary | ICD-10-CM | POA: Diagnosis not present

## 2021-01-06 DIAGNOSIS — H539 Unspecified visual disturbance: Secondary | ICD-10-CM | POA: Diagnosis not present

## 2021-01-07 DIAGNOSIS — H539 Unspecified visual disturbance: Secondary | ICD-10-CM | POA: Diagnosis not present

## 2021-01-08 DIAGNOSIS — H539 Unspecified visual disturbance: Secondary | ICD-10-CM | POA: Diagnosis not present

## 2021-01-09 DIAGNOSIS — H539 Unspecified visual disturbance: Secondary | ICD-10-CM | POA: Diagnosis not present

## 2021-01-10 DIAGNOSIS — H539 Unspecified visual disturbance: Secondary | ICD-10-CM | POA: Diagnosis not present

## 2021-01-11 DIAGNOSIS — H539 Unspecified visual disturbance: Secondary | ICD-10-CM | POA: Diagnosis not present

## 2021-01-12 DIAGNOSIS — H539 Unspecified visual disturbance: Secondary | ICD-10-CM | POA: Diagnosis not present

## 2021-01-13 DIAGNOSIS — H539 Unspecified visual disturbance: Secondary | ICD-10-CM | POA: Diagnosis not present

## 2021-01-14 DIAGNOSIS — H539 Unspecified visual disturbance: Secondary | ICD-10-CM | POA: Diagnosis not present

## 2021-01-14 DIAGNOSIS — Z23 Encounter for immunization: Secondary | ICD-10-CM | POA: Diagnosis not present

## 2021-01-15 DIAGNOSIS — Z23 Encounter for immunization: Secondary | ICD-10-CM | POA: Diagnosis not present

## 2021-01-15 DIAGNOSIS — H539 Unspecified visual disturbance: Secondary | ICD-10-CM | POA: Diagnosis not present

## 2021-01-16 DIAGNOSIS — H539 Unspecified visual disturbance: Secondary | ICD-10-CM | POA: Diagnosis not present

## 2021-01-17 DIAGNOSIS — H539 Unspecified visual disturbance: Secondary | ICD-10-CM | POA: Diagnosis not present

## 2021-01-18 DIAGNOSIS — H539 Unspecified visual disturbance: Secondary | ICD-10-CM | POA: Diagnosis not present

## 2021-01-19 ENCOUNTER — Other Ambulatory Visit: Payer: Self-pay

## 2021-01-19 ENCOUNTER — Ambulatory Visit (HOSPITAL_BASED_OUTPATIENT_CLINIC_OR_DEPARTMENT_OTHER): Payer: Medicaid Other | Admitting: Internal Medicine

## 2021-01-19 ENCOUNTER — Ambulatory Visit: Payer: Medicaid Other | Attending: Internal Medicine | Admitting: Internal Medicine

## 2021-01-19 ENCOUNTER — Encounter: Payer: Self-pay | Admitting: Internal Medicine

## 2021-01-19 DIAGNOSIS — E538 Deficiency of other specified B group vitamins: Secondary | ICD-10-CM

## 2021-01-19 DIAGNOSIS — I10 Essential (primary) hypertension: Secondary | ICD-10-CM

## 2021-01-19 DIAGNOSIS — Z7189 Other specified counseling: Secondary | ICD-10-CM

## 2021-01-19 DIAGNOSIS — N1831 Chronic kidney disease, stage 3a: Secondary | ICD-10-CM

## 2021-01-19 DIAGNOSIS — E559 Vitamin D deficiency, unspecified: Secondary | ICD-10-CM

## 2021-01-19 DIAGNOSIS — H539 Unspecified visual disturbance: Secondary | ICD-10-CM | POA: Diagnosis not present

## 2021-01-19 DIAGNOSIS — R627 Adult failure to thrive: Secondary | ICD-10-CM

## 2021-01-19 NOTE — Assessment & Plan Note (Signed)
We will check laboratory work today.

## 2021-01-19 NOTE — Assessment & Plan Note (Signed)
We will check lab work today.  It seems like he has been inconsistent with oral B12 replacement.  We will get a new baseline today.

## 2021-01-19 NOTE — Assessment & Plan Note (Signed)
We will check lab work today.

## 2021-01-19 NOTE — Assessment & Plan Note (Signed)
87 year old, thin male with probable moderate malnutrition.  The patient is well cared for at home by his family.  I had a discussion with the patient's son about CODE STATUS.  I suggested that they have a discussion at home about how they see the patient's life ending.  I do not think he is an imminent threat of dying but they should have these conversations now.

## 2021-01-19 NOTE — Progress Notes (Signed)
    Patient ID: Martin Tanner, male    DOB: [birth year]  MRN: [id number]  CC: Immigration paperwork  This patient had seen Dr. Cato Mulligan today.  His grandson, Martin Tanner  is with him.  He did show Dr. Cato Mulligan in immigration form to be examined from taking the citizenship exam.  This was a form that I had filled out for him back in February 2021.  I spoke with the grandson to get clarification on whether immigration denied the form the right completed.  He stated that the form was not denied.  It was denied because he needed a letter from social services disclosing the fact that the patient was getting food stamps.  He intends to get that letter from social services and send it to immigration.  He brought the form today because he thought the form will expire next month.  However immigration did not tell him that it will expire or that the form needed to be done again. -In this case, I told the grandson to get the additional information that immigration requested from social services.  I told him that I do not have to redo the form at this time unless immigration tells him that it needs to be redone. Patient scored a 3 on Mini-Mental status exam today.  He continues to live with family and requires assistance with ADLs.    [employer] interpreter used during this encounter.  [employer]igd[place] Dk #[id number],  Address: [address] [place] [address], [place] [address] [phone]   Requested Prescriptions    No prescriptions requested or ordered in this encounter    No follow-ups on file.  Jonah Blue, MD, FACP

## 2021-01-19 NOTE — Progress Notes (Signed)
Pt states he is having some discomfort in right arm from covid booster   In addition he brings in paper work for medical certification for disability exceptions.   He lives with his daughter and son in Social worker. Today grandson is here and interpreting.  I am told the patient is quite demented. Is able to recognize his daughter but no other family members. He sleeps well. He eats ok but someone has to cook for him. He needs help with adls: bathing, showering, dressing, etc... He does not remember day, year, etc.  In addition to the arm pain he may have intermittent back pain.   Past Medical History:  Diagnosis Date  . Anemia   . BPH (benign prostatic hyperplasia)   . Chronic kidney disease   . Upper GI bleed 11/17/2017    Social History   Socioeconomic History  . Marital status: Widowed    Spouse name: Not on file  . Number of children: Not on file  . Years of education: Not on file  . Highest education level: Not on file  Occupational History  . Not on file  Tobacco Use  . Smoking status: Current Every Day Smoker    Packs/day: 0.25    Years: 62.00    Pack years: 15.50    Types: Cigarettes  . Smokeless tobacco: Never Used  Substance and Sexual Activity  . Alcohol use: No    Alcohol/week: 0.0 standard drinks  . Drug use: No  . Sexual activity: Never  Other Topics Concern  . Not on file  Social History Narrative  . Not on file   Social Determinants of Health   Financial Resource Strain: Not on file  Food Insecurity: Not on file  Transportation Needs: Not on file  Physical Activity: Not on file  Stress: Not on file  Social Connections: Not on file  Intimate Partner Violence: Not on file    Past Surgical History:  Procedure Laterality Date  . ESOPHAGOGASTRODUODENOSCOPY (EGD) WITH PROPOFOL N/A 11/18/2017   Procedure: ESOPHAGOGASTRODUODENOSCOPY (EGD) WITH PROPOFOL;  Surgeon: Vida Rigger, MD;  Location: [facility] ENDOSCOPY;  Service: Endoscopy;  Laterality: N/A;  . None    .  TRANSURETHRAL RESECTION OF PROSTATE N/A 03/16/2013   Procedure: TRANSURETHRAL RESECTION OF THE PROSTATE WITH GYRUS INSTRUMENTS;  Surgeon: Marcine Matar, MD;  Location: [facility] ORS;  Service: Urology;  Laterality: N/A;    Family History  Problem Relation Age of Onset  . Other Other        Negative CAD  . Other Other        Negative DM2  . Other Other        Negative HTN  . Other Other        Negative Cancer    Allergies  Allergen Reactions  . Meat [Alpha-Gal] Other (See Comments)    Food preference, Pt is a Vegetarian... Dietary restrictions only.  Not for Medication purposes  . Norvasc [Amlodipine]     Current Outpatient Medications on File Prior to Visit  Medication Sig Dispense Refill  . Ergocalciferol (VITAMIN D2) 10 MCG (400 UNIT) TABS 1 tab daily (Patient not taking: No sig reported) 100 tablet 1  . feeding supplement, ENSURE ENLIVE, (ENSURE ENLIVE) LIQD Take 237 mLs by mouth daily. (Patient not taking: No sig reported) 237 mL 12  . lisinopril (ZESTRIL) 5 MG tablet Take 1 tablet (5 mg total) by mouth daily. (Patient not taking: Reported on 01/19/2021) 30 tablet 3  . meclizine (ANTIVERT) 12.5 MG tablet Take 1  tablet (12.5 mg total) by mouth daily as needed for nausea. (Patient not taking: Reported on 01/19/2021) 30 tablet 0  . omeprazole (PRILOSEC) 20 MG capsule Take 1 capsule (20 mg total) by mouth daily. (Patient not taking: No sig reported) 30 capsule 3  . vitamin B-12 (CYANOCOBALAMIN) 500 MCG tablet Take 2 tablets (1,000 mcg total) by mouth daily. (Patient not taking: No sig reported) 60 tablet 1   Current Facility-Administered Medications on File Prior to Visit  Medication Dose Route Frequency Provider Last Rate Last Admin  . cyanocobalamin ((VITAMIN B-12)) injection 1,000 mcg  1,000 mcg Intramuscular Q30 days Marcine Matar, MD   1,000 mcg at 01/18/20 1600  . cyanocobalamin ((VITAMIN B-12)) injection 1,000 mcg  1,000 mcg Intramuscular Q30 days Marcine Matar, MD    1,000 mcg at 02/22/20 1708     patient denies chest pain, shortness of breath, orthopnea. Denies lower extremity edema, abdominal pain, change in appetite, change in bowel movements. Patient denies rashes, musculoskeletal complaints. No other specific complaints in a complete review of systems.   BP (!) 166/89   Pulse 70   Temp 98.7 F (37.1 C)   Resp 16   Wt 101 lb 3.2 oz (45.9 kg)   SpO2 98%   BMI 19.76 kg/m  thin well-nourished male in no acute distress. HEENT exam atraumatic, normocephalic, neck supple without jugular venous distention. Chest clear to auscultation cardiac exam S1-S2 are regular. Abdominal exam thin with bowel sounds, soft and nontender. Extremities no edema. Neurologic exam is alert with a broad based gait.  Vitamin B12 deficiency We will check lab work today.  It seems like he has been inconsistent with oral B12 replacement.  We will get a new baseline today.  Vitamin D deficiency We will check lab work today.  Essential hypertension Patient has been nonadherent with medications.  His blood pressure is elevated and he is 87 years old.  The data for treatment of hypertension in a 87 year old is essentially nonexistent.  It may be safer for him not to take any medications then try to treat this patient who has difficulty with medication adherence.  Failure to thrive in adult 87 year old, thin male with probable moderate malnutrition.  The patient is well cared for at home by his family.  I had a discussion with the patient's son about CODE STATUS.  I suggested that they have a discussion at home about how they see the patient's life ending.  I do not think he is an imminent threat of dying but they should have these conversations now.  CKD (chronic kidney disease) We will check laboratory work today.

## 2021-01-19 NOTE — Assessment & Plan Note (Signed)
Patient has been nonadherent with medications.  His blood pressure is elevated and he is 87 years old.  The data for treatment of hypertension in a 87 year old is essentially nonexistent.  It may be safer for him not to take any medications then try to treat this patient who has difficulty with medication adherence.

## 2021-01-20 DIAGNOSIS — H539 Unspecified visual disturbance: Secondary | ICD-10-CM | POA: Diagnosis not present

## 2021-01-20 LAB — COMPREHENSIVE METABOLIC PANEL
ALT: 20 IU/L (ref 0–44)
AST: 41 IU/L — ABNORMAL HIGH (ref 0–40)
Albumin/Globulin Ratio: 1.2 (ref 1.2–2.2)
Albumin: 4.3 g/dL (ref 3.5–4.6)
Alkaline Phosphatase: 116 IU/L (ref 44–121)
BUN/Creatinine Ratio: 10 (ref 10–24)
BUN: 13 mg/dL (ref 10–36)
Bilirubin Total: 0.4 mg/dL (ref 0.0–1.2)
CO2: 23 mmol/L (ref 20–29)
Calcium: 9.2 mg/dL (ref 8.6–10.2)
Chloride: 103 mmol/L (ref 96–106)
Creatinine, Ser: 1.25 mg/dL (ref 0.76–1.27)
GFR calc Af Amer: 58 mL/min/{1.73_m2} — ABNORMAL LOW (ref >59)
GFR calc non Af Amer: 50 mL/min/{1.73_m2} — ABNORMAL LOW (ref >59)
Globulin, Total: 3.7 g/dL (ref 1.5–4.5)
Glucose: 88 mg/dL (ref 65–99)
Potassium: 5 mmol/L (ref 3.5–5.2)
Sodium: 139 mmol/L (ref 134–144)
Total Protein: 8 g/dL (ref 6.0–8.5)

## 2021-01-20 LAB — CBC WITH DIFFERENTIAL/PLATELET
Basophils Absolute: 0.1 10*3/uL (ref 0.0–0.2)
Basos: 1 %
EOS (ABSOLUTE): 0.3 10*3/uL (ref 0.0–0.4)
Eos: 6 %
Hematocrit: 37.9 % (ref 37.5–51.0)
Hemoglobin: 12.9 g/dL — ABNORMAL LOW (ref 13.0–17.7)
Immature Grans (Abs): 0 10*3/uL (ref 0.0–0.1)
Immature Granulocytes: 0 %
Lymphocytes Absolute: 1.5 10*3/uL (ref 0.7–3.1)
Lymphs: 26 %
MCH: 29.8 pg (ref 26.6–33.0)
MCHC: 34 g/dL (ref 31.5–35.7)
MCV: 88 fL (ref 79–97)
Monocytes Absolute: 0.4 10*3/uL (ref 0.1–0.9)
Monocytes: 7 %
Neutrophils Absolute: 3.5 10*3/uL (ref 1.4–7.0)
Neutrophils: 60 %
Platelets: 156 10*3/uL (ref 150–450)
RBC: 4.33 x10E6/uL (ref 4.14–5.80)
RDW: 12.9 % (ref 11.6–15.4)
WBC: 5.8 10*3/uL (ref 3.4–10.8)

## 2021-01-20 LAB — VITAMIN D 25 HYDROXY (VIT D DEFICIENCY, FRACTURES): Vit D, 25-Hydroxy: 22.3 ng/mL — ABNORMAL LOW (ref 30.0–100.0)

## 2021-01-20 LAB — HEMOGLOBIN A1C
Est. average glucose Bld gHb Est-mCnc: 103 mg/dL
Hgb A1c MFr Bld: 5.2 % (ref 4.8–5.6)

## 2021-01-20 LAB — VITAMIN B12: Vitamin B-12: 261 pg/mL (ref 232–1245)

## 2021-01-21 DIAGNOSIS — H539 Unspecified visual disturbance: Secondary | ICD-10-CM | POA: Diagnosis not present

## 2021-01-22 DIAGNOSIS — H539 Unspecified visual disturbance: Secondary | ICD-10-CM | POA: Diagnosis not present

## 2021-01-23 DIAGNOSIS — H539 Unspecified visual disturbance: Secondary | ICD-10-CM | POA: Diagnosis not present

## 2021-01-24 DIAGNOSIS — H539 Unspecified visual disturbance: Secondary | ICD-10-CM | POA: Diagnosis not present

## 2021-01-25 DIAGNOSIS — H539 Unspecified visual disturbance: Secondary | ICD-10-CM | POA: Diagnosis not present

## 2021-01-26 DIAGNOSIS — H539 Unspecified visual disturbance: Secondary | ICD-10-CM | POA: Diagnosis not present

## 2021-01-27 DIAGNOSIS — H539 Unspecified visual disturbance: Secondary | ICD-10-CM | POA: Diagnosis not present

## 2021-01-28 DIAGNOSIS — H539 Unspecified visual disturbance: Secondary | ICD-10-CM | POA: Diagnosis not present

## 2021-01-29 DIAGNOSIS — H539 Unspecified visual disturbance: Secondary | ICD-10-CM | POA: Diagnosis not present

## 2021-01-30 DIAGNOSIS — H539 Unspecified visual disturbance: Secondary | ICD-10-CM | POA: Diagnosis not present

## 2021-01-31 DIAGNOSIS — H539 Unspecified visual disturbance: Secondary | ICD-10-CM | POA: Diagnosis not present

## 2021-02-01 DIAGNOSIS — H539 Unspecified visual disturbance: Secondary | ICD-10-CM | POA: Diagnosis not present

## 2021-02-02 DIAGNOSIS — H539 Unspecified visual disturbance: Secondary | ICD-10-CM | POA: Diagnosis not present

## 2021-02-03 DIAGNOSIS — H539 Unspecified visual disturbance: Secondary | ICD-10-CM | POA: Diagnosis not present

## 2021-02-04 DIAGNOSIS — H539 Unspecified visual disturbance: Secondary | ICD-10-CM | POA: Diagnosis not present

## 2021-02-05 DIAGNOSIS — H539 Unspecified visual disturbance: Secondary | ICD-10-CM | POA: Diagnosis not present

## 2021-02-06 DIAGNOSIS — H539 Unspecified visual disturbance: Secondary | ICD-10-CM | POA: Diagnosis not present

## 2021-02-07 DIAGNOSIS — H539 Unspecified visual disturbance: Secondary | ICD-10-CM | POA: Diagnosis not present

## 2021-02-08 DIAGNOSIS — H539 Unspecified visual disturbance: Secondary | ICD-10-CM | POA: Diagnosis not present

## 2021-02-09 DIAGNOSIS — H539 Unspecified visual disturbance: Secondary | ICD-10-CM | POA: Diagnosis not present

## 2021-02-10 DIAGNOSIS — H539 Unspecified visual disturbance: Secondary | ICD-10-CM | POA: Diagnosis not present

## 2021-02-11 DIAGNOSIS — H539 Unspecified visual disturbance: Secondary | ICD-10-CM | POA: Diagnosis not present

## 2021-02-12 DIAGNOSIS — H539 Unspecified visual disturbance: Secondary | ICD-10-CM | POA: Diagnosis not present

## 2021-02-13 DIAGNOSIS — H539 Unspecified visual disturbance: Secondary | ICD-10-CM | POA: Diagnosis not present

## 2021-02-14 DIAGNOSIS — H539 Unspecified visual disturbance: Secondary | ICD-10-CM | POA: Diagnosis not present

## 2021-02-15 DIAGNOSIS — H539 Unspecified visual disturbance: Secondary | ICD-10-CM | POA: Diagnosis not present

## 2021-02-16 DIAGNOSIS — H539 Unspecified visual disturbance: Secondary | ICD-10-CM | POA: Diagnosis not present

## 2021-02-17 DIAGNOSIS — H539 Unspecified visual disturbance: Secondary | ICD-10-CM | POA: Diagnosis not present

## 2021-02-18 DIAGNOSIS — H539 Unspecified visual disturbance: Secondary | ICD-10-CM | POA: Diagnosis not present

## 2021-02-19 DIAGNOSIS — H539 Unspecified visual disturbance: Secondary | ICD-10-CM | POA: Diagnosis not present

## 2021-02-20 DIAGNOSIS — H539 Unspecified visual disturbance: Secondary | ICD-10-CM | POA: Diagnosis not present

## 2021-02-21 DIAGNOSIS — H539 Unspecified visual disturbance: Secondary | ICD-10-CM | POA: Diagnosis not present

## 2021-02-22 DIAGNOSIS — H539 Unspecified visual disturbance: Secondary | ICD-10-CM | POA: Diagnosis not present

## 2021-02-23 DIAGNOSIS — H539 Unspecified visual disturbance: Secondary | ICD-10-CM | POA: Diagnosis not present

## 2021-02-24 DIAGNOSIS — H539 Unspecified visual disturbance: Secondary | ICD-10-CM | POA: Diagnosis not present

## 2021-02-25 DIAGNOSIS — H539 Unspecified visual disturbance: Secondary | ICD-10-CM | POA: Diagnosis not present

## 2021-02-26 DIAGNOSIS — H539 Unspecified visual disturbance: Secondary | ICD-10-CM | POA: Diagnosis not present

## 2021-02-27 DIAGNOSIS — H539 Unspecified visual disturbance: Secondary | ICD-10-CM | POA: Diagnosis not present

## 2021-02-28 DIAGNOSIS — H539 Unspecified visual disturbance: Secondary | ICD-10-CM | POA: Diagnosis not present

## 2021-03-01 DIAGNOSIS — H539 Unspecified visual disturbance: Secondary | ICD-10-CM | POA: Diagnosis not present

## 2021-03-02 DIAGNOSIS — H539 Unspecified visual disturbance: Secondary | ICD-10-CM | POA: Diagnosis not present

## 2021-03-03 DIAGNOSIS — H539 Unspecified visual disturbance: Secondary | ICD-10-CM | POA: Diagnosis not present

## 2021-03-04 DIAGNOSIS — H539 Unspecified visual disturbance: Secondary | ICD-10-CM | POA: Diagnosis not present

## 2021-03-05 DIAGNOSIS — H539 Unspecified visual disturbance: Secondary | ICD-10-CM | POA: Diagnosis not present

## 2021-03-06 DIAGNOSIS — H539 Unspecified visual disturbance: Secondary | ICD-10-CM | POA: Diagnosis not present

## 2021-03-07 DIAGNOSIS — H539 Unspecified visual disturbance: Secondary | ICD-10-CM | POA: Diagnosis not present

## 2021-03-08 DIAGNOSIS — H539 Unspecified visual disturbance: Secondary | ICD-10-CM | POA: Diagnosis not present

## 2021-03-09 DIAGNOSIS — H539 Unspecified visual disturbance: Secondary | ICD-10-CM | POA: Diagnosis not present

## 2021-03-10 DIAGNOSIS — H539 Unspecified visual disturbance: Secondary | ICD-10-CM | POA: Diagnosis not present

## 2021-03-11 DIAGNOSIS — H539 Unspecified visual disturbance: Secondary | ICD-10-CM | POA: Diagnosis not present

## 2021-03-13 DIAGNOSIS — H539 Unspecified visual disturbance: Secondary | ICD-10-CM | POA: Diagnosis not present

## 2021-03-14 DIAGNOSIS — H539 Unspecified visual disturbance: Secondary | ICD-10-CM | POA: Diagnosis not present

## 2021-03-15 DIAGNOSIS — H539 Unspecified visual disturbance: Secondary | ICD-10-CM | POA: Diagnosis not present

## 2021-03-16 DIAGNOSIS — H539 Unspecified visual disturbance: Secondary | ICD-10-CM | POA: Diagnosis not present

## 2021-03-17 DIAGNOSIS — H539 Unspecified visual disturbance: Secondary | ICD-10-CM | POA: Diagnosis not present

## 2021-03-18 DIAGNOSIS — H539 Unspecified visual disturbance: Secondary | ICD-10-CM | POA: Diagnosis not present

## 2021-03-19 DIAGNOSIS — H539 Unspecified visual disturbance: Secondary | ICD-10-CM | POA: Diagnosis not present

## 2021-03-20 DIAGNOSIS — H539 Unspecified visual disturbance: Secondary | ICD-10-CM | POA: Diagnosis not present

## 2021-03-21 DIAGNOSIS — H539 Unspecified visual disturbance: Secondary | ICD-10-CM | POA: Diagnosis not present

## 2021-03-22 DIAGNOSIS — H539 Unspecified visual disturbance: Secondary | ICD-10-CM | POA: Diagnosis not present

## 2021-03-23 DIAGNOSIS — H539 Unspecified visual disturbance: Secondary | ICD-10-CM | POA: Diagnosis not present

## 2021-03-24 DIAGNOSIS — H539 Unspecified visual disturbance: Secondary | ICD-10-CM | POA: Diagnosis not present

## 2021-03-25 DIAGNOSIS — H539 Unspecified visual disturbance: Secondary | ICD-10-CM | POA: Diagnosis not present

## 2021-03-26 DIAGNOSIS — H539 Unspecified visual disturbance: Secondary | ICD-10-CM | POA: Diagnosis not present

## 2021-03-27 DIAGNOSIS — H539 Unspecified visual disturbance: Secondary | ICD-10-CM | POA: Diagnosis not present

## 2021-03-28 DIAGNOSIS — H539 Unspecified visual disturbance: Secondary | ICD-10-CM | POA: Diagnosis not present

## 2021-03-29 DIAGNOSIS — H539 Unspecified visual disturbance: Secondary | ICD-10-CM | POA: Diagnosis not present

## 2021-03-30 DIAGNOSIS — H539 Unspecified visual disturbance: Secondary | ICD-10-CM | POA: Diagnosis not present

## 2021-03-31 DIAGNOSIS — H539 Unspecified visual disturbance: Secondary | ICD-10-CM | POA: Diagnosis not present

## 2021-04-01 DIAGNOSIS — H539 Unspecified visual disturbance: Secondary | ICD-10-CM | POA: Diagnosis not present

## 2021-04-02 DIAGNOSIS — H539 Unspecified visual disturbance: Secondary | ICD-10-CM | POA: Diagnosis not present

## 2021-04-03 DIAGNOSIS — H539 Unspecified visual disturbance: Secondary | ICD-10-CM | POA: Diagnosis not present

## 2021-04-04 DIAGNOSIS — H539 Unspecified visual disturbance: Secondary | ICD-10-CM | POA: Diagnosis not present

## 2021-04-05 DIAGNOSIS — H539 Unspecified visual disturbance: Secondary | ICD-10-CM | POA: Diagnosis not present

## 2021-04-06 DIAGNOSIS — H539 Unspecified visual disturbance: Secondary | ICD-10-CM | POA: Diagnosis not present

## 2021-04-07 DIAGNOSIS — H539 Unspecified visual disturbance: Secondary | ICD-10-CM | POA: Diagnosis not present

## 2021-04-08 DIAGNOSIS — H539 Unspecified visual disturbance: Secondary | ICD-10-CM | POA: Diagnosis not present

## 2021-04-09 DIAGNOSIS — H539 Unspecified visual disturbance: Secondary | ICD-10-CM | POA: Diagnosis not present

## 2021-04-10 DIAGNOSIS — H539 Unspecified visual disturbance: Secondary | ICD-10-CM | POA: Diagnosis not present

## 2021-04-11 DIAGNOSIS — H539 Unspecified visual disturbance: Secondary | ICD-10-CM | POA: Diagnosis not present

## 2021-04-12 DIAGNOSIS — H539 Unspecified visual disturbance: Secondary | ICD-10-CM | POA: Diagnosis not present

## 2021-04-13 DIAGNOSIS — H539 Unspecified visual disturbance: Secondary | ICD-10-CM | POA: Diagnosis not present

## 2021-04-14 DIAGNOSIS — H539 Unspecified visual disturbance: Secondary | ICD-10-CM | POA: Diagnosis not present

## 2021-04-15 DIAGNOSIS — H539 Unspecified visual disturbance: Secondary | ICD-10-CM | POA: Diagnosis not present

## 2021-04-16 DIAGNOSIS — H539 Unspecified visual disturbance: Secondary | ICD-10-CM | POA: Diagnosis not present

## 2021-04-17 DIAGNOSIS — H539 Unspecified visual disturbance: Secondary | ICD-10-CM | POA: Diagnosis not present

## 2021-04-18 DIAGNOSIS — H539 Unspecified visual disturbance: Secondary | ICD-10-CM | POA: Diagnosis not present

## 2021-04-19 DIAGNOSIS — H539 Unspecified visual disturbance: Secondary | ICD-10-CM | POA: Diagnosis not present

## 2021-04-20 DIAGNOSIS — H539 Unspecified visual disturbance: Secondary | ICD-10-CM | POA: Diagnosis not present

## 2021-04-21 DIAGNOSIS — H539 Unspecified visual disturbance: Secondary | ICD-10-CM | POA: Diagnosis not present

## 2021-04-22 DIAGNOSIS — H539 Unspecified visual disturbance: Secondary | ICD-10-CM | POA: Diagnosis not present

## 2021-04-23 DIAGNOSIS — H539 Unspecified visual disturbance: Secondary | ICD-10-CM | POA: Diagnosis not present

## 2021-04-24 DIAGNOSIS — H539 Unspecified visual disturbance: Secondary | ICD-10-CM | POA: Diagnosis not present

## 2021-04-25 DIAGNOSIS — H539 Unspecified visual disturbance: Secondary | ICD-10-CM | POA: Diagnosis not present

## 2021-04-26 DIAGNOSIS — H539 Unspecified visual disturbance: Secondary | ICD-10-CM | POA: Diagnosis not present

## 2021-04-27 DIAGNOSIS — H539 Unspecified visual disturbance: Secondary | ICD-10-CM | POA: Diagnosis not present

## 2021-04-28 DIAGNOSIS — H539 Unspecified visual disturbance: Secondary | ICD-10-CM | POA: Diagnosis not present

## 2021-04-29 DIAGNOSIS — H539 Unspecified visual disturbance: Secondary | ICD-10-CM | POA: Diagnosis not present

## 2021-04-30 DIAGNOSIS — H539 Unspecified visual disturbance: Secondary | ICD-10-CM | POA: Diagnosis not present

## 2021-05-01 DIAGNOSIS — H539 Unspecified visual disturbance: Secondary | ICD-10-CM | POA: Diagnosis not present

## 2021-05-02 DIAGNOSIS — H539 Unspecified visual disturbance: Secondary | ICD-10-CM | POA: Diagnosis not present

## 2021-05-03 DIAGNOSIS — H539 Unspecified visual disturbance: Secondary | ICD-10-CM | POA: Diagnosis not present

## 2021-05-04 DIAGNOSIS — H539 Unspecified visual disturbance: Secondary | ICD-10-CM | POA: Diagnosis not present

## 2021-05-05 DIAGNOSIS — H539 Unspecified visual disturbance: Secondary | ICD-10-CM | POA: Diagnosis not present

## 2021-05-06 DIAGNOSIS — H539 Unspecified visual disturbance: Secondary | ICD-10-CM | POA: Diagnosis not present

## 2021-05-07 DIAGNOSIS — H539 Unspecified visual disturbance: Secondary | ICD-10-CM | POA: Diagnosis not present

## 2021-05-08 DIAGNOSIS — H539 Unspecified visual disturbance: Secondary | ICD-10-CM | POA: Diagnosis not present

## 2021-05-09 DIAGNOSIS — H539 Unspecified visual disturbance: Secondary | ICD-10-CM | POA: Diagnosis not present

## 2021-05-10 DIAGNOSIS — H539 Unspecified visual disturbance: Secondary | ICD-10-CM | POA: Diagnosis not present

## 2021-05-11 DIAGNOSIS — H539 Unspecified visual disturbance: Secondary | ICD-10-CM | POA: Diagnosis not present

## 2021-05-12 DIAGNOSIS — H539 Unspecified visual disturbance: Secondary | ICD-10-CM | POA: Diagnosis not present

## 2021-05-13 DIAGNOSIS — H539 Unspecified visual disturbance: Secondary | ICD-10-CM | POA: Diagnosis not present

## 2021-05-14 DIAGNOSIS — H539 Unspecified visual disturbance: Secondary | ICD-10-CM | POA: Diagnosis not present

## 2021-05-15 DIAGNOSIS — H539 Unspecified visual disturbance: Secondary | ICD-10-CM | POA: Diagnosis not present

## 2021-05-16 DIAGNOSIS — H539 Unspecified visual disturbance: Secondary | ICD-10-CM | POA: Diagnosis not present

## 2021-05-17 DIAGNOSIS — H539 Unspecified visual disturbance: Secondary | ICD-10-CM | POA: Diagnosis not present

## 2021-05-18 DIAGNOSIS — H539 Unspecified visual disturbance: Secondary | ICD-10-CM | POA: Diagnosis not present

## 2021-05-19 DIAGNOSIS — H539 Unspecified visual disturbance: Secondary | ICD-10-CM | POA: Diagnosis not present

## 2021-05-20 DIAGNOSIS — H539 Unspecified visual disturbance: Secondary | ICD-10-CM | POA: Diagnosis not present

## 2021-05-21 DIAGNOSIS — H539 Unspecified visual disturbance: Secondary | ICD-10-CM | POA: Diagnosis not present

## 2021-05-22 DIAGNOSIS — H539 Unspecified visual disturbance: Secondary | ICD-10-CM | POA: Diagnosis not present

## 2021-05-23 DIAGNOSIS — H539 Unspecified visual disturbance: Secondary | ICD-10-CM | POA: Diagnosis not present

## 2021-05-24 DIAGNOSIS — H539 Unspecified visual disturbance: Secondary | ICD-10-CM | POA: Diagnosis not present

## 2021-05-25 DIAGNOSIS — H539 Unspecified visual disturbance: Secondary | ICD-10-CM | POA: Diagnosis not present

## 2021-05-26 DIAGNOSIS — H539 Unspecified visual disturbance: Secondary | ICD-10-CM | POA: Diagnosis not present

## 2021-05-27 DIAGNOSIS — H539 Unspecified visual disturbance: Secondary | ICD-10-CM | POA: Diagnosis not present

## 2021-05-28 DIAGNOSIS — H539 Unspecified visual disturbance: Secondary | ICD-10-CM | POA: Diagnosis not present

## 2021-05-29 DIAGNOSIS — H539 Unspecified visual disturbance: Secondary | ICD-10-CM | POA: Diagnosis not present

## 2021-05-30 DIAGNOSIS — H539 Unspecified visual disturbance: Secondary | ICD-10-CM | POA: Diagnosis not present

## 2021-05-31 DIAGNOSIS — H539 Unspecified visual disturbance: Secondary | ICD-10-CM | POA: Diagnosis not present

## 2021-06-01 DIAGNOSIS — H539 Unspecified visual disturbance: Secondary | ICD-10-CM | POA: Diagnosis not present

## 2021-06-02 DIAGNOSIS — H539 Unspecified visual disturbance: Secondary | ICD-10-CM | POA: Diagnosis not present

## 2021-06-03 DIAGNOSIS — H539 Unspecified visual disturbance: Secondary | ICD-10-CM | POA: Diagnosis not present

## 2021-06-04 DIAGNOSIS — H539 Unspecified visual disturbance: Secondary | ICD-10-CM | POA: Diagnosis not present

## 2021-06-05 DIAGNOSIS — H539 Unspecified visual disturbance: Secondary | ICD-10-CM | POA: Diagnosis not present

## 2021-06-06 DIAGNOSIS — H539 Unspecified visual disturbance: Secondary | ICD-10-CM | POA: Diagnosis not present

## 2021-06-07 DIAGNOSIS — H539 Unspecified visual disturbance: Secondary | ICD-10-CM | POA: Diagnosis not present

## 2021-06-08 DIAGNOSIS — H539 Unspecified visual disturbance: Secondary | ICD-10-CM | POA: Diagnosis not present

## 2021-06-08 DIAGNOSIS — R509 Fever, unspecified: Secondary | ICD-10-CM | POA: Diagnosis not present

## 2021-06-08 DIAGNOSIS — Z20822 Contact with and (suspected) exposure to covid-19: Secondary | ICD-10-CM | POA: Diagnosis not present

## 2021-06-08 DIAGNOSIS — R059 Cough, unspecified: Secondary | ICD-10-CM | POA: Diagnosis not present

## 2021-06-08 DIAGNOSIS — R42 Dizziness and giddiness: Secondary | ICD-10-CM | POA: Diagnosis not present

## 2021-06-09 DIAGNOSIS — H539 Unspecified visual disturbance: Secondary | ICD-10-CM | POA: Diagnosis not present

## 2021-06-10 DIAGNOSIS — H539 Unspecified visual disturbance: Secondary | ICD-10-CM | POA: Diagnosis not present

## 2021-06-11 DIAGNOSIS — H539 Unspecified visual disturbance: Secondary | ICD-10-CM | POA: Diagnosis not present

## 2021-06-12 DIAGNOSIS — H539 Unspecified visual disturbance: Secondary | ICD-10-CM | POA: Diagnosis not present

## 2021-06-13 DIAGNOSIS — H539 Unspecified visual disturbance: Secondary | ICD-10-CM | POA: Diagnosis not present

## 2021-06-14 DIAGNOSIS — H539 Unspecified visual disturbance: Secondary | ICD-10-CM | POA: Diagnosis not present

## 2021-06-15 DIAGNOSIS — H539 Unspecified visual disturbance: Secondary | ICD-10-CM | POA: Diagnosis not present

## 2021-06-16 DIAGNOSIS — H539 Unspecified visual disturbance: Secondary | ICD-10-CM | POA: Diagnosis not present

## 2021-06-17 DIAGNOSIS — H539 Unspecified visual disturbance: Secondary | ICD-10-CM | POA: Diagnosis not present

## 2021-06-18 DIAGNOSIS — H539 Unspecified visual disturbance: Secondary | ICD-10-CM | POA: Diagnosis not present

## 2021-06-19 DIAGNOSIS — H539 Unspecified visual disturbance: Secondary | ICD-10-CM | POA: Diagnosis not present

## 2021-06-20 DIAGNOSIS — H539 Unspecified visual disturbance: Secondary | ICD-10-CM | POA: Diagnosis not present

## 2021-06-21 DIAGNOSIS — H539 Unspecified visual disturbance: Secondary | ICD-10-CM | POA: Diagnosis not present

## 2021-06-22 DIAGNOSIS — H539 Unspecified visual disturbance: Secondary | ICD-10-CM | POA: Diagnosis not present

## 2021-06-23 DIAGNOSIS — H539 Unspecified visual disturbance: Secondary | ICD-10-CM | POA: Diagnosis not present

## 2021-06-24 DIAGNOSIS — H539 Unspecified visual disturbance: Secondary | ICD-10-CM | POA: Diagnosis not present

## 2021-06-25 DIAGNOSIS — H539 Unspecified visual disturbance: Secondary | ICD-10-CM | POA: Diagnosis not present

## 2021-06-26 DIAGNOSIS — H539 Unspecified visual disturbance: Secondary | ICD-10-CM | POA: Diagnosis not present

## 2021-06-27 DIAGNOSIS — H539 Unspecified visual disturbance: Secondary | ICD-10-CM | POA: Diagnosis not present

## 2021-06-28 DIAGNOSIS — H539 Unspecified visual disturbance: Secondary | ICD-10-CM | POA: Diagnosis not present

## 2021-06-29 DIAGNOSIS — H539 Unspecified visual disturbance: Secondary | ICD-10-CM | POA: Diagnosis not present

## 2021-06-30 DIAGNOSIS — H539 Unspecified visual disturbance: Secondary | ICD-10-CM | POA: Diagnosis not present

## 2021-07-01 DIAGNOSIS — H539 Unspecified visual disturbance: Secondary | ICD-10-CM | POA: Diagnosis not present

## 2021-07-02 DIAGNOSIS — H539 Unspecified visual disturbance: Secondary | ICD-10-CM | POA: Diagnosis not present

## 2021-07-03 DIAGNOSIS — H539 Unspecified visual disturbance: Secondary | ICD-10-CM | POA: Diagnosis not present

## 2021-07-04 DIAGNOSIS — H539 Unspecified visual disturbance: Secondary | ICD-10-CM | POA: Diagnosis not present

## 2021-07-05 DIAGNOSIS — H539 Unspecified visual disturbance: Secondary | ICD-10-CM | POA: Diagnosis not present

## 2021-07-06 DIAGNOSIS — H539 Unspecified visual disturbance: Secondary | ICD-10-CM | POA: Diagnosis not present

## 2021-07-07 DIAGNOSIS — H539 Unspecified visual disturbance: Secondary | ICD-10-CM | POA: Diagnosis not present

## 2021-07-08 DIAGNOSIS — H539 Unspecified visual disturbance: Secondary | ICD-10-CM | POA: Diagnosis not present

## 2021-07-09 DIAGNOSIS — H539 Unspecified visual disturbance: Secondary | ICD-10-CM | POA: Diagnosis not present

## 2021-07-10 DIAGNOSIS — H539 Unspecified visual disturbance: Secondary | ICD-10-CM | POA: Diagnosis not present

## 2021-07-11 DIAGNOSIS — H539 Unspecified visual disturbance: Secondary | ICD-10-CM | POA: Diagnosis not present

## 2021-07-12 DIAGNOSIS — H539 Unspecified visual disturbance: Secondary | ICD-10-CM | POA: Diagnosis not present

## 2021-07-13 DIAGNOSIS — H539 Unspecified visual disturbance: Secondary | ICD-10-CM | POA: Diagnosis not present

## 2021-07-14 DIAGNOSIS — H539 Unspecified visual disturbance: Secondary | ICD-10-CM | POA: Diagnosis not present

## 2021-07-15 DIAGNOSIS — H539 Unspecified visual disturbance: Secondary | ICD-10-CM | POA: Diagnosis not present

## 2021-07-16 DIAGNOSIS — H539 Unspecified visual disturbance: Secondary | ICD-10-CM | POA: Diagnosis not present

## 2021-07-17 DIAGNOSIS — H539 Unspecified visual disturbance: Secondary | ICD-10-CM | POA: Diagnosis not present

## 2021-07-18 DIAGNOSIS — H539 Unspecified visual disturbance: Secondary | ICD-10-CM | POA: Diagnosis not present

## 2021-07-19 DIAGNOSIS — H539 Unspecified visual disturbance: Secondary | ICD-10-CM | POA: Diagnosis not present

## 2021-07-20 DIAGNOSIS — H539 Unspecified visual disturbance: Secondary | ICD-10-CM | POA: Diagnosis not present

## 2021-07-21 DIAGNOSIS — H539 Unspecified visual disturbance: Secondary | ICD-10-CM | POA: Diagnosis not present

## 2021-07-22 DIAGNOSIS — H539 Unspecified visual disturbance: Secondary | ICD-10-CM | POA: Diagnosis not present

## 2021-07-23 DIAGNOSIS — H539 Unspecified visual disturbance: Secondary | ICD-10-CM | POA: Diagnosis not present

## 2021-07-24 DIAGNOSIS — H539 Unspecified visual disturbance: Secondary | ICD-10-CM | POA: Diagnosis not present

## 2021-07-25 DIAGNOSIS — H539 Unspecified visual disturbance: Secondary | ICD-10-CM | POA: Diagnosis not present

## 2021-07-26 DIAGNOSIS — H539 Unspecified visual disturbance: Secondary | ICD-10-CM | POA: Diagnosis not present

## 2021-07-27 DIAGNOSIS — H539 Unspecified visual disturbance: Secondary | ICD-10-CM | POA: Diagnosis not present

## 2021-07-28 DIAGNOSIS — H539 Unspecified visual disturbance: Secondary | ICD-10-CM | POA: Diagnosis not present

## 2021-07-29 DIAGNOSIS — H539 Unspecified visual disturbance: Secondary | ICD-10-CM | POA: Diagnosis not present

## 2021-07-30 DIAGNOSIS — H539 Unspecified visual disturbance: Secondary | ICD-10-CM | POA: Diagnosis not present

## 2021-07-31 DIAGNOSIS — H539 Unspecified visual disturbance: Secondary | ICD-10-CM | POA: Diagnosis not present

## 2021-08-01 DIAGNOSIS — H539 Unspecified visual disturbance: Secondary | ICD-10-CM | POA: Diagnosis not present

## 2021-08-02 DIAGNOSIS — H539 Unspecified visual disturbance: Secondary | ICD-10-CM | POA: Diagnosis not present

## 2021-08-03 DIAGNOSIS — H539 Unspecified visual disturbance: Secondary | ICD-10-CM | POA: Diagnosis not present

## 2021-08-04 DIAGNOSIS — H539 Unspecified visual disturbance: Secondary | ICD-10-CM | POA: Diagnosis not present

## 2021-08-05 DIAGNOSIS — H539 Unspecified visual disturbance: Secondary | ICD-10-CM | POA: Diagnosis not present

## 2021-08-06 DIAGNOSIS — H539 Unspecified visual disturbance: Secondary | ICD-10-CM | POA: Diagnosis not present

## 2021-08-07 DIAGNOSIS — H539 Unspecified visual disturbance: Secondary | ICD-10-CM | POA: Diagnosis not present

## 2021-08-08 DIAGNOSIS — H539 Unspecified visual disturbance: Secondary | ICD-10-CM | POA: Diagnosis not present

## 2021-08-09 DIAGNOSIS — H539 Unspecified visual disturbance: Secondary | ICD-10-CM | POA: Diagnosis not present

## 2021-08-10 DIAGNOSIS — H539 Unspecified visual disturbance: Secondary | ICD-10-CM | POA: Diagnosis not present

## 2021-08-11 DIAGNOSIS — H539 Unspecified visual disturbance: Secondary | ICD-10-CM | POA: Diagnosis not present

## 2021-08-12 DIAGNOSIS — H539 Unspecified visual disturbance: Secondary | ICD-10-CM | POA: Diagnosis not present

## 2021-08-13 DIAGNOSIS — H539 Unspecified visual disturbance: Secondary | ICD-10-CM | POA: Diagnosis not present

## 2021-08-14 DIAGNOSIS — H539 Unspecified visual disturbance: Secondary | ICD-10-CM | POA: Diagnosis not present

## 2021-08-15 DIAGNOSIS — H539 Unspecified visual disturbance: Secondary | ICD-10-CM | POA: Diagnosis not present

## 2021-08-16 DIAGNOSIS — H539 Unspecified visual disturbance: Secondary | ICD-10-CM | POA: Diagnosis not present

## 2021-08-17 DIAGNOSIS — H539 Unspecified visual disturbance: Secondary | ICD-10-CM | POA: Diagnosis not present

## 2021-08-18 DIAGNOSIS — H539 Unspecified visual disturbance: Secondary | ICD-10-CM | POA: Diagnosis not present

## 2021-08-21 DIAGNOSIS — H539 Unspecified visual disturbance: Secondary | ICD-10-CM | POA: Diagnosis not present

## 2021-08-22 DIAGNOSIS — H539 Unspecified visual disturbance: Secondary | ICD-10-CM | POA: Diagnosis not present

## 2021-08-23 DIAGNOSIS — H539 Unspecified visual disturbance: Secondary | ICD-10-CM | POA: Diagnosis not present

## 2021-08-24 DIAGNOSIS — H539 Unspecified visual disturbance: Secondary | ICD-10-CM | POA: Diagnosis not present

## 2021-08-25 DIAGNOSIS — H539 Unspecified visual disturbance: Secondary | ICD-10-CM | POA: Diagnosis not present

## 2021-08-28 ENCOUNTER — Other Ambulatory Visit: Payer: Self-pay | Admitting: Internal Medicine

## 2021-08-28 DIAGNOSIS — H539 Unspecified visual disturbance: Secondary | ICD-10-CM | POA: Diagnosis not present

## 2021-08-28 DIAGNOSIS — I10 Essential (primary) hypertension: Secondary | ICD-10-CM

## 2021-08-29 DIAGNOSIS — H539 Unspecified visual disturbance: Secondary | ICD-10-CM | POA: Diagnosis not present

## 2021-08-30 DIAGNOSIS — H539 Unspecified visual disturbance: Secondary | ICD-10-CM | POA: Diagnosis not present

## 2021-08-31 DIAGNOSIS — H539 Unspecified visual disturbance: Secondary | ICD-10-CM | POA: Diagnosis not present

## 2021-09-01 DIAGNOSIS — H539 Unspecified visual disturbance: Secondary | ICD-10-CM | POA: Diagnosis not present

## 2021-09-04 DIAGNOSIS — H539 Unspecified visual disturbance: Secondary | ICD-10-CM | POA: Diagnosis not present

## 2021-09-05 DIAGNOSIS — H539 Unspecified visual disturbance: Secondary | ICD-10-CM | POA: Diagnosis not present

## 2021-09-06 DIAGNOSIS — H539 Unspecified visual disturbance: Secondary | ICD-10-CM | POA: Diagnosis not present

## 2021-09-27 ENCOUNTER — Ambulatory Visit: Payer: Medicaid Other | Admitting: Physician Assistant

## 2021-09-29 ENCOUNTER — Encounter: Payer: Self-pay | Admitting: Internal Medicine

## 2021-09-29 ENCOUNTER — Ambulatory Visit: Payer: Medicaid Other | Attending: Physician Assistant | Admitting: Internal Medicine

## 2021-09-29 ENCOUNTER — Other Ambulatory Visit: Payer: Self-pay

## 2021-09-29 VITALS — BP 167/95 | HR 59 | Resp 16 | Wt 97.0 lb

## 2021-09-29 DIAGNOSIS — E538 Deficiency of other specified B group vitamins: Secondary | ICD-10-CM | POA: Diagnosis not present

## 2021-09-29 DIAGNOSIS — D631 Anemia in chronic kidney disease: Secondary | ICD-10-CM | POA: Diagnosis not present

## 2021-09-29 DIAGNOSIS — Z888 Allergy status to other drugs, medicaments and biological substances status: Secondary | ICD-10-CM | POA: Diagnosis not present

## 2021-09-29 DIAGNOSIS — I129 Hypertensive chronic kidney disease with stage 1 through stage 4 chronic kidney disease, or unspecified chronic kidney disease: Secondary | ICD-10-CM | POA: Insufficient documentation

## 2021-09-29 DIAGNOSIS — Z79899 Other long term (current) drug therapy: Secondary | ICD-10-CM | POA: Insufficient documentation

## 2021-09-29 DIAGNOSIS — Z760 Encounter for issue of repeat prescription: Secondary | ICD-10-CM | POA: Insufficient documentation

## 2021-09-29 DIAGNOSIS — K219 Gastro-esophageal reflux disease without esophagitis: Secondary | ICD-10-CM | POA: Diagnosis not present

## 2021-09-29 DIAGNOSIS — E559 Vitamin D deficiency, unspecified: Secondary | ICD-10-CM | POA: Insufficient documentation

## 2021-09-29 DIAGNOSIS — M79671 Pain in right foot: Secondary | ICD-10-CM | POA: Diagnosis not present

## 2021-09-29 DIAGNOSIS — Z23 Encounter for immunization: Secondary | ICD-10-CM | POA: Diagnosis not present

## 2021-09-29 DIAGNOSIS — F0390 Unspecified dementia without behavioral disturbance: Secondary | ICD-10-CM | POA: Diagnosis not present

## 2021-09-29 DIAGNOSIS — R3981 Functional urinary incontinence: Secondary | ICD-10-CM | POA: Diagnosis not present

## 2021-09-29 DIAGNOSIS — I10 Essential (primary) hypertension: Secondary | ICD-10-CM | POA: Diagnosis not present

## 2021-09-29 DIAGNOSIS — M79672 Pain in left foot: Secondary | ICD-10-CM | POA: Diagnosis not present

## 2021-09-29 DIAGNOSIS — R159 Full incontinence of feces: Secondary | ICD-10-CM | POA: Insufficient documentation

## 2021-09-29 DIAGNOSIS — N189 Chronic kidney disease, unspecified: Secondary | ICD-10-CM | POA: Insufficient documentation

## 2021-09-29 DIAGNOSIS — H9190 Unspecified hearing loss, unspecified ear: Secondary | ICD-10-CM | POA: Diagnosis not present

## 2021-09-29 DIAGNOSIS — Z87891 Personal history of nicotine dependence: Secondary | ICD-10-CM | POA: Diagnosis not present

## 2021-09-29 DIAGNOSIS — D696 Thrombocytopenia, unspecified: Secondary | ICD-10-CM | POA: Insufficient documentation

## 2021-09-29 MED ORDER — DICLOFENAC SODIUM 1 % EX GEL: 2.0000 g | Freq: Four times a day (QID) | CUTANEOUS | 2 refills | Status: AC

## 2021-09-29 MED ORDER — VITAMIN B-12 1000 MCG PO TABS: 1000.0000 ug | ORAL_TABLET | Freq: Every day | ORAL | 2 refills | Status: AC

## 2021-09-29 MED ORDER — LISINOPRIL 5 MG PO TABS: 5.0000 mg | ORAL_TABLET | Freq: Every day | ORAL | 4 refills | Status: DC

## 2021-09-29 NOTE — Progress Notes (Signed)
Patient ID: Martin Tanner, male    DOB: 02-May-1928  MRN: [id number]  CC: foot pain   Subjective: Martin Tanner is a 87 y.o. male who presents for BL foot pain. Martin Tanner is with him and interprets and gives some of the history.  She states that the patient is her grandfather in law. His concerns today include:  Patient with history of dementia, Vit B12 def, vit D def, HTN, GERD, functional urinary incontinence, fecal incontinence, decreased hearing.   Patient complains of intermittent pain over the left lateral malleolus and the medial aspect of the right foot going towards the big toe. Martin Tanner states that sometimes these areas become swollen but not red.  He does not take anything for the pain but complains about it.  He has not had any falls.  She is also requesting refill on blood pressure medication.  I had him on low-dose of lisinopril at 1 point.  She states that he sometimes complains of dizziness and she wonders whether it is due to his blood pressure being elevated.  He denies any headaches.  CBC that was done in January of this year revealed hemoglobin of 12.9 with hematocrit of 37.9.  Dementia: She reports that patient eats okay and is sleeping okay.  He remains forgetful.  Sometimes he forgets to turn the water off.  He used to have an aide but on reassessment she states the examiner determined that he no longer needed an aide.  He is at home alone during the day as she and her husband works.  She is wanting to see whether we can get an aide reinstated for him.  He has Medicaid.  Patient with history of B12 deficiency.  They have not been bringing him to get his B12 injections consistently. Patient Active Problem List   Diagnosis Date Noted   Vitamin D deficiency 01/20/2020   Vitamin B12 deficiency 01/20/2020   Essential hypertension 11/30/2019   History of recent fall 11/30/2019   Gastroesophageal reflux disease without esophagitis 11/30/2019   Failure to thrive in adult  11/30/2019   Functional urinary incontinence 11/30/2019   Incontinence of feces 11/30/2019   Dementia without behavioral disturbance (HCC) 11/30/2019   Anemia due to chronic kidney disease 11/18/2017   CKD (chronic kidney disease) 11/18/2017   Hemangiectasia 11/18/2017   Thrombocytopenia (HCC) 01/28/2013   Urinary retention 01/25/2013   Weakness generalized 01/25/2013     No current outpatient medications on file prior to visit.   Current Facility-Administered Medications on File Prior to Visit  Medication Dose Route Frequency Provider Last Rate Last Admin   cyanocobalamin ((VITAMIN B-12)) injection 1,000 mcg  1,000 mcg Intramuscular Q30 days Jonah Blue B, MD   1,000 mcg at 01/18/20 1600   cyanocobalamin ((VITAMIN B-12)) injection 1,000 mcg  1,000 mcg Intramuscular Q30 days Marcine Matar, MD   1,000 mcg at 02/22/20 1708    Allergies  Allergen Reactions   Meat [Alpha-Gal] Other (See Comments)    Food preference, Pt is a Vegetarian... Dietary restrictions only.  Not for Medication purposes   Norvasc [Amlodipine]     Social History   Socioeconomic History   Marital status: Widowed    Spouse name: Not on file   Number of children: Not on file   Years of education: Not on file   Highest education level: Not on file  Occupational History   Not on file  Tobacco Use   Smoking status: Former    Packs/day: 0.25  Years: 62.00    Pack years: 15.50    Types: Cigarettes   Smokeless tobacco: Never  Substance and Sexual Activity   Alcohol use: No    Alcohol/week: 0.0 standard drinks   Drug use: No   Sexual activity: Never  Other Topics Concern   Not on file  Social History Narrative   Not on file   Social Determinants of Health   Financial Resource Strain: Not on file  Food Insecurity: Not on file  Transportation Needs: Not on file  Physical Activity: Not on file  Stress: Not on file  Social Connections: Not on file  Intimate Partner Violence: Not on file     Family History  Problem Relation Age of Onset   Other Other        Negative CAD   Other Other        Negative DM2   Other Other        Negative HTN   Other Other        Negative Cancer    Past Surgical History:  Procedure Laterality Date   ESOPHAGOGASTRODUODENOSCOPY (EGD) WITH PROPOFOL N/A 11/18/2017   Procedure: ESOPHAGOGASTRODUODENOSCOPY (EGD) WITH PROPOFOL;  Surgeon: Vida Rigger, MD;  Location: [facility] ENDOSCOPY;  Service: Endoscopy;  Laterality: N/A;   None     TRANSURETHRAL RESECTION OF PROSTATE N/A 03/16/2013   Procedure: TRANSURETHRAL RESECTION OF THE PROSTATE WITH GYRUS INSTRUMENTS;  Surgeon: Marcine Matar, MD;  Location: [facility] ORS;  Service: Urology;  Laterality: N/A;    ROS: Review of Systems Negative except as stated above  PHYSICAL EXAM: BP (!) 181/78   Pulse (!) 59   Resp 16   Wt 97 lb (44 kg)   SpO2 96%   BMI 18.94 kg/m   Wt Readings from Last 3 Encounters:  09/29/21 97 lb (44 kg)  01/19/21 101 lb 3.2 oz (45.9 kg)  02/22/20 103 lb (46.7 kg)    Physical Exam  General appearance -elderly male who appears slightly frail and low weight.  He is adequately closed with.  He is in no acute distress. Mental status -patient not able to provide much history. Mouth -oral mucosa is moist. Neck - supple, no significant adenopathy Chest - clear to auscultation, no wheezes, rales or rhonchi, symmetric air entry Heart - normal rate, regular rhythm, normal S1, S2, no murmurs, rubs, clicks or gallops Neurological -cranial nerves seem to be intact.  He does not follow commands consistently. Musculoskeletal -mild enlargement of bilateral ankle joint.  No edema or erythema at this time.  Mild discomfort with passive range of motion of the left ankle.  No edema or erythema noted of the right big toe at this time. Extremities -no lower extremity edema.  CMP Latest Ref Rng & Units 01/19/2021 11/30/2019 05/17/2019  Glucose 65 - 99 mg/dL 88 379(K) 240(X)  BUN 10 - 36 mg/dL 13 17  73(Z)  Creatinine 0.76 - 1.27 mg/dL 3.29 9.24 2.68  Sodium 134 - 144 mmol/L 139 143 141  Potassium 3.5 - 5.2 mmol/L 5.0 4.3 3.3(L)  Chloride 96 - 106 mmol/L 103 104 111  CO2 20 - 29 mmol/L 23 20 20(L)  Calcium 8.6 - 10.2 mg/dL 9.2 9.3 3.4(H)  Total Protein 6.0 - 8.5 g/dL 8.0 7.7 6.3(L)  Total Bilirubin 0.0 - 1.2 mg/dL 0.4 0.5 0.3  Alkaline Phos 44 - 121 IU/L 116 116 49  AST 0 - 40 IU/L 41(H) 23 32  ALT 0 - 44 IU/L 20 15 23    Lipid Panel  Component Value Date/Time   CHOL 177 02/23/2014 1740   TRIG 92 05/13/2019 0527   HDL 40 02/23/2014 1740   CHOLHDL 4.4 02/23/2014 1740   VLDL 20 02/23/2014 1740   LDLCALC 117 (H) 02/23/2014 1740    CBC    Component Value Date/Time   WBC 5.8 01/19/2021 1536   WBC 4.6 05/17/2019 0532   RBC 4.33 01/19/2021 1536   RBC 3.58 (L) 05/17/2019 0532   HGB 12.9 (L) 01/19/2021 1536   HCT 37.9 01/19/2021 1536   PLT 156 01/19/2021 1536   MCV 88 01/19/2021 1536   MCH 29.8 01/19/2021 1536   MCH 31.3 05/17/2019 0532   MCHC 34.0 01/19/2021 1536   MCHC 35.0 05/17/2019 0532   RDW 12.9 01/19/2021 1536   LYMPHSABS 1.5 01/19/2021 1536   MONOABS 0.1 05/17/2019 0532   EOSABS 0.3 01/19/2021 1536   BASOSABS 0.1 01/19/2021 1536    ASSESSMENT AND PLAN: 1. Pain in both feet I question whether he may be having episodes of gout.  Advised that the next time he has a flareup of pain over the left lateral malleolus of the right foot, they should bring him in to be assessed.  Other possibility is osteoarthritis.  I have prescribed some Voltaren gel for him to use. - diclofenac Sodium (VOLTAREN) 1 % GEL; Apply 2 g topically 4 (four) times daily.  Dispense: 100 g; Refill: 2  2. Essential hypertension Not at goal.  Refill given on low-dose lisinopril.  We will check chemistry.  Follow-up with clinical pharmacist in 1 month for recheck. - Comprehensive metabolic panel - CBC - lisinopril (ZESTRIL) 5 MG tablet; Take 1 tablet (5 mg total) by mouth daily.  Dispense: 30  tablet; Refill: 4  3. Dementia without behavioral disturbance [facility]) Message sent to our caseworker to assist with getting him a home health aide which is reasonable. -Advised that some family member should be with him at all times for safety reasons.  4. Vitamin B12 deficiency Will change to oral B12 supplement since family is unlikely to get him in consistently every month for injections. - Vitamin B12 - vitamin B-12 (CYANOCOBALAMIN) 1000 MCG tablet; Take 1 tablet (1,000 mcg total) by mouth daily.  Dispense: 90 tablet; Refill: 2  5. Need for influenza vaccination     Patient was given the opportunity to ask questions.  Patient verbalized understanding of the plan and was able to repeat key elements of the plan.   No orders of the defined types were placed in this encounter.    Requested Prescriptions    No prescriptions requested or ordered in this encounter    No follow-ups on file.  Jonah Blue, MD, FACP

## 2021-09-30 LAB — COMPREHENSIVE METABOLIC PANEL
ALT: 12 IU/L (ref 0–44)
AST: 21 IU/L (ref 0–40)
Albumin/Globulin Ratio: 1.5 (ref 1.2–2.2)
Albumin: 4.4 g/dL (ref 3.5–4.6)
Alkaline Phosphatase: 90 IU/L (ref 44–121)
BUN/Creatinine Ratio: 13 (ref 10–24)
BUN: 17 mg/dL (ref 10–36)
Bilirubin Total: 0.3 mg/dL (ref 0.0–1.2)
CO2: 22 mmol/L (ref 20–29)
Calcium: 8.9 mg/dL (ref 8.6–10.2)
Chloride: 106 mmol/L (ref 96–106)
Creatinine, Ser: 1.32 mg/dL — ABNORMAL HIGH (ref 0.76–1.27)
Globulin, Total: 2.9 g/dL (ref 1.5–4.5)
Glucose: 106 mg/dL — ABNORMAL HIGH (ref 70–99)
Potassium: 4.2 mmol/L (ref 3.5–5.2)
Sodium: 141 mmol/L (ref 134–144)
Total Protein: 7.3 g/dL (ref 6.0–8.5)
eGFR: 51 mL/min/{1.73_m2} — ABNORMAL LOW (ref >59)

## 2021-09-30 LAB — CBC
Hematocrit: 37.4 % — ABNORMAL LOW (ref 37.5–51.0)
Hemoglobin: 12.8 g/dL — ABNORMAL LOW (ref 13.0–17.7)
MCH: 29.8 pg (ref 26.6–33.0)
MCHC: 34.2 g/dL (ref 31.5–35.7)
MCV: 87 fL (ref 79–97)
Platelets: 145 10*3/uL — ABNORMAL LOW (ref 150–450)
RBC: 4.29 x10E6/uL (ref 4.14–5.80)
RDW: 12.6 % (ref 11.6–15.4)
WBC: 4.9 10*3/uL (ref 3.4–10.8)

## 2021-09-30 LAB — VITAMIN B12: Vitamin B-12: 217 pg/mL — ABNORMAL LOW (ref 232–1245)

## 2021-09-30 NOTE — Progress Notes (Signed)
Let patient's grandson or his grandson's wife know that he has a mild but stable anemia.  Vitamin B 12 level is low.  Please make sure that he takes the Vitamin B12 supplement daily as prescribed on last visit.  Kidney function not 100%.  Make sure that he drinks 4-8 glasses of water daily.

## 2021-10-04 ENCOUNTER — Telehealth: Payer: Self-pay | Admitting: *Deleted

## 2021-10-04 NOTE — Telephone Encounter (Signed)
Copied from CRM [id number]. Topic: General - Other >> Oct 02, 2021 11:08 AM Darron Doom wrote: Reason for CRM: Patient granddaughter Martin Tanner called in to inform Martin Tanner that the insurance does not cover medication for diclofenac Sodium (VOLTAREN) 1 % GEL and lisinopril (ZESTRIL) 5 MG tablet and are asking for a prior authorization in order for patient to get his medicine. Please call patient at Ph#  [phone]

## 2021-10-05 ENCOUNTER — Telehealth: Payer: Self-pay

## 2021-10-05 NOTE — Telephone Encounter (Signed)
Contacted pt to go over lab results spoke to pt grandson and made aware and he doesn't have any questions or concerns

## 2021-10-05 NOTE — Telephone Encounter (Signed)
At request of Dr Laural Benes, [facility] referral faxed to [facility]

## 2021-10-06 ENCOUNTER — Other Ambulatory Visit: Payer: Self-pay

## 2021-10-06 NOTE — Telephone Encounter (Signed)
Martin Tanner have you received any prior auths for pt

## 2021-10-06 NOTE — Telephone Encounter (Signed)
Returned call and provided information to grandson and if he has any questions or concerns to give [place] a call

## 2021-10-31 ENCOUNTER — Ambulatory Visit: Payer: Medicaid Other | Attending: Internal Medicine | Admitting: Pharmacist

## 2021-10-31 ENCOUNTER — Encounter: Payer: Self-pay | Admitting: Pharmacist

## 2021-10-31 ENCOUNTER — Other Ambulatory Visit: Payer: Self-pay

## 2021-10-31 VITALS — BP 138/81

## 2021-10-31 DIAGNOSIS — I10 Essential (primary) hypertension: Secondary | ICD-10-CM

## 2021-10-31 NOTE — Progress Notes (Signed)
   S:    PCP: Dr. Laural Benes   Patient arrives in good spirits, accompanied by his daughter. Presents to the clinic for hypertension evaluation, counseling, and management. Daughter provides entire history.   Medication adherence reported. Patient's daughter denies adverse effects. No falls since last PCP visit. Daughter does report that pt has weakness but no additional dizziness since starting lisinopril.   Current BP Medications include:  lisinopril 5 mg daily  Dietary habits include: daughter reports that they monitor sodium, caffeine.  Exercise habits include: none  Family / Social history:  FHX: no pertinent positives listed  Tobacco use: former smoker (0.25 PPD for 64 yrs)  Alcohol: none reported    O:  Vitals:   10/31/21 1048  BP: 138/81   Home BP readings: none given   Last 3 Office BP readings: BP Readings from Last 3 Encounters:  10/31/21 138/81  09/29/21 (!) 167/95  01/19/21 (!) 166/89    BMET    Component Value Date/Time   NA 141 09/29/2021 1647   K 4.2 09/29/2021 1647   CL 106 09/29/2021 1647   CO2 22 09/29/2021 1647   GLUCOSE 106 (H) 09/29/2021 1647   GLUCOSE 185 (H) 05/17/2019 0532   BUN 17 09/29/2021 1647   CREATININE 1.32 (H) 09/29/2021 1647   CREATININE 1.33 02/23/2014 1740   CALCIUM 8.9 09/29/2021 1647   GFRNONAA 50 (L) 01/19/2021 1536   GFRNONAA 49 (L) 02/23/2014 1740   GFRAA 58 (L) 01/19/2021 1536   GFRAA 56 (L) 02/23/2014 1740    Renal function: CrCl cannot be calculated (Patient's most recent lab result is older than the maximum 21 days allowed.).  Clinical ASCVD: No  The ASCVD Risk score (Arnett DK, et al., 2019) failed to calculate for the following reasons:   The 2019 ASCVD risk score is only valid for ages 61 to 21  A/P: Hypertension longstanding currently at goal on current medications. BP Goals poorly established in this patient population. Medication adherence reported. Pt requires close monitoring to avoid falls, hypotension,  AKI.Counseling given to pt's daughter with instructions to stop lisinopril if pt reports dizziness, presyncope, falls, or other S/sx of hypotension.   -Continued lisinopril 5 mg daily for now.  -F/u labs ordered - BMP -Counseled on lifestyle modifications for blood pressure control including reduced dietary sodium, increased exercise, adequate sleep.  Results reviewed and written information provided.   Total time in face-to-face counseling 15 minutes.   F/U Clinic Visit with PCP.  Butch Penny, PharmD, Patsy [place], CPP Clinical Pharmacist [facility] [phone]

## 2021-11-01 ENCOUNTER — Telehealth: Payer: Self-pay

## 2021-11-01 LAB — BASIC METABOLIC PANEL
BUN/Creatinine Ratio: 12 (ref 10–24)
BUN: 16 mg/dL (ref 10–36)
CO2: 23 mmol/L (ref 20–29)
Calcium: 9.4 mg/dL (ref 8.6–10.2)
Chloride: 107 mmol/L — ABNORMAL HIGH (ref 96–106)
Creatinine, Ser: 1.36 mg/dL — ABNORMAL HIGH (ref 0.76–1.27)
Glucose: 103 mg/dL — ABNORMAL HIGH (ref 70–99)
Potassium: 5 mmol/L (ref 3.5–5.2)
Sodium: 142 mmol/L (ref 134–144)
eGFR: 49 mL/min/{1.73_m2} — ABNORMAL LOW (ref >59)

## 2021-11-01 NOTE — Telephone Encounter (Signed)
Contacted pt grandson to go over lab results pt grandson is aware and doesn't have any questions or concerns

## 2021-12-04 DIAGNOSIS — M79671 Pain in right foot: Secondary | ICD-10-CM | POA: Diagnosis not present

## 2021-12-05 DIAGNOSIS — M79671 Pain in right foot: Secondary | ICD-10-CM | POA: Diagnosis not present

## 2021-12-06 DIAGNOSIS — M79671 Pain in right foot: Secondary | ICD-10-CM | POA: Diagnosis not present

## 2021-12-07 DIAGNOSIS — M79671 Pain in right foot: Secondary | ICD-10-CM | POA: Diagnosis not present

## 2021-12-08 DIAGNOSIS — M79671 Pain in right foot: Secondary | ICD-10-CM | POA: Diagnosis not present

## 2021-12-11 DIAGNOSIS — M79671 Pain in right foot: Secondary | ICD-10-CM | POA: Diagnosis not present

## 2021-12-12 DIAGNOSIS — M79671 Pain in right foot: Secondary | ICD-10-CM | POA: Diagnosis not present

## 2021-12-13 ENCOUNTER — Telehealth: Payer: Self-pay

## 2021-12-13 DIAGNOSIS — M79671 Pain in right foot: Secondary | ICD-10-CM | POA: Diagnosis not present

## 2021-12-13 NOTE — Telephone Encounter (Signed)
Call placed to [facility], spoke to Jobrick who confirmed that the patient has been assessed and approved for services °

## 2021-12-14 DIAGNOSIS — M79671 Pain in right foot: Secondary | ICD-10-CM | POA: Diagnosis not present

## 2021-12-15 DIAGNOSIS — M79671 Pain in right foot: Secondary | ICD-10-CM | POA: Diagnosis not present

## 2021-12-18 DIAGNOSIS — M79671 Pain in right foot: Secondary | ICD-10-CM | POA: Diagnosis not present

## 2021-12-19 DIAGNOSIS — M79671 Pain in right foot: Secondary | ICD-10-CM | POA: Diagnosis not present

## 2021-12-20 DIAGNOSIS — M79671 Pain in right foot: Secondary | ICD-10-CM | POA: Diagnosis not present

## 2021-12-21 DIAGNOSIS — M79671 Pain in right foot: Secondary | ICD-10-CM | POA: Diagnosis not present

## 2021-12-22 DIAGNOSIS — M79671 Pain in right foot: Secondary | ICD-10-CM | POA: Diagnosis not present

## 2022-03-05 ENCOUNTER — Other Ambulatory Visit: Payer: Self-pay | Admitting: Internal Medicine

## 2022-03-05 DIAGNOSIS — I10 Essential (primary) hypertension: Secondary | ICD-10-CM

## 2022-03-05 NOTE — Telephone Encounter (Signed)
Requested Prescriptions  ?Pending Prescriptions Disp Refills  ?? lisinopril (ZESTRIL) 5 MG tablet [Pharmacy Med Name: LISINOPRIL 5MG  TABLETS] 30 tablet 4  ?  Sig: TAKE 1 TABLET(5 MG) BY MOUTH DAILY  ?  ? Cardiovascular:  ACE Inhibitors Failed - 03/05/2022  3:51 AM  ?  ?  Failed - Cr in normal range and within 180 days  ?  Creat  ?Date Value Ref Range Status  ?02/23/2014 1.33 0.50 - 1.35 mg/dL Final  ? ?Creatinine, Ser  ?Date Value Ref Range Status  ?10/31/2021 1.36 (H) 0.76 - 1.27 mg/dL Final  ?   ?  ?  Passed - K in normal range and within 180 days  ?  Potassium  ?Date Value Ref Range Status  ?10/31/2021 5.0 3.5 - 5.2 mmol/L Final  ?   ?  ?  Passed - Patient is not pregnant  ?  ?  Passed - Last BP in normal range  ?  BP Readings from Last 1 Encounters:  ?10/31/21 138/81  ?   ?  ?  Passed - Valid encounter within last 6 months  ?  Recent Outpatient Visits   ?      ? 4 months ago Essential hypertension  ? [facility], Lois Huxley, RPH-CPP  ? 5 months ago Pain in both feet  ? [facility], MD  ? 1 year ago Encounter for family conference with patient present  ? [facility], MD  ? 1 year ago Vitamin B12 deficiency  ? [facility] [place], [facility], MD  ? 2 years ago Vitamin B12 deficiency  ? [facility], MD  ?  ?  ? ?  ?  ?  ? ? ?

## 2022-03-21 ENCOUNTER — Ambulatory Visit: Payer: Self-pay | Admitting: *Deleted

## 2022-03-21 NOTE — Telephone Encounter (Signed)
Per agent: ?"Right leg swollen for 2 days and loose stools" ? ? ?Chief Complaint: right leg swelling, loose stools for 1 month ?Symptoms: Right leg swelling "Knee to foot, twice the size." Also states "Seed like wound, like bubble on leg."  ?Frequency: 1 month off and on, both swelling and diarrhea ?Pertinent Negatives: Patient denies  ?Disposition: [x] ED /[] Urgent Care (no appt availability in office) / [] Appointment(In office/virtual)/ []  [facility]/ [] Home Care/ [] Refused Recommended Disposition /[] [facility]/ []  Follow-up with PCP ?Additional Notes: Very difficult call, assisted by InterpreterTeresa . Daughter in law giving conflicting reports of symptoms.  ? ?Advised ED, declines. States went to ED, told to follow up with PCP. Pt disconnected before NT could alert FC of ED refusal. ?Please advise. ?Daughter in [place] ?[phone] ? ?Reason for Disposition ? Patient sounds very sick or weak to the triager ? ?Answer Assessment - Initial Assessment Questions ?1. ONSET: "When did the swelling start?" (e.g., minutes, hours, days) ?    1 month ago, off and on ?2. LOCATION: "What part of the leg is swollen?"  "Are both legs swollen or just one leg?" ?    Knee to foot ?3. SEVERITY: "How bad is the swelling?" (e.g., localized; mild, moderate, severe) ? - Localized - small area of swelling localized to one leg ? - MILD pedal edema - swelling limited to foot and ankle, pitting edema < 1/4 inch (6 mm) deep, rest and elevation eliminate most or all swelling ? - MODERATE edema - swelling of lower leg to knee, pitting edema > 1/4 inch (6 mm) deep, rest and elevation only partially reduce swelling ? - SEVERE edema - swelling extends above knee, facial or hand swelling present  ?    "Twice the size" ?4. REDNESS: "Does the swelling look red or infected?" ?    "Like a seed bubble." ?5. PAIN: "Is the swelling painful to touch?" If Yes, ask: "How painful is it?"   (Scale 1-10; mild, moderate or  severe) ?    *No Answer* ?6. FEVER: "Do you have a fever?" If Yes, ask: "What is it, how was it measured, and when did it start?"  ?    *No Answer* ?7. CAUSE: "What do you think is causing the leg swelling?" ?    *No Answer* ?8. MEDICAL HISTORY: "Do you have a history of heart failure, kidney disease, liver failure, or cancer?" ?    *No Answer* ?9. RECURRENT SYMPTOM: "Have you had leg swelling before?" If Yes, ask: "When was the last time?" "What happened that time?" ?    *No Answer* ?10. OTHER SYMPTOMS: "Do you have any other symptoms?" (e.g., chest pain, difficulty breathing) ?      *No Answer* ? ?Protocols used: Leg Swelling and Edema-A-AH ? ?

## 2022-03-22 NOTE — Telephone Encounter (Signed)
[employer] interpreter:  Rogelia Boga ?Id# [id number] ? ?Contacted pt to schedule an appt pt didn't answer lvm  ?

## 2022-04-30 ENCOUNTER — Telehealth: Payer: Self-pay | Admitting: Internal Medicine

## 2022-04-30 NOTE — Telephone Encounter (Signed)
Copied from [place] [id number]. Topic: Referral - Request for Referral ?>> Apr 30, 2022 10:05 AM Yvette Rack wrote: ?Has patient seen PCP for this complaint? No. Pt was seen in ED ?*If NO, is insurance requiring patient see PCP for this issue before PCP can refer them? ?Referral for which specialty: GI ?Preferred provider/office: [place]  ?Reason for referral: Pt was seen in ED and advised to get referral from pcp for GI ?

## 2022-05-02 NOTE — Telephone Encounter (Signed)
For diarrhea.  ?Will schedule appointment.  ?

## 2022-05-02 NOTE — Telephone Encounter (Signed)
Transferred to MyChart support to get Mychart set.up with representative.  ?

## 2022-05-04 ENCOUNTER — Telehealth (HOSPITAL_BASED_OUTPATIENT_CLINIC_OR_DEPARTMENT_OTHER): Payer: Medicare Other | Admitting: Internal Medicine

## 2022-05-04 DIAGNOSIS — K529 Noninfective gastroenteritis and colitis, unspecified: Secondary | ICD-10-CM | POA: Diagnosis not present

## 2022-05-04 DIAGNOSIS — R627 Adult failure to thrive: Secondary | ICD-10-CM

## 2022-05-04 MED ORDER — LOPERAMIDE HCL 2 MG PO TABS: ORAL_TABLET | ORAL | 0 refills | Status: AC

## 2022-05-04 NOTE — Progress Notes (Signed)
Virtual Visit via Video Note ? ?I connected with Martin Tanner on 05/04/2022 at 8:14 a.m by a video enabled telemedicine application and verified that I am speaking with the correct person using two identifiers. ? ?Location: ?Patient: home.  Pt and his grandson participated in this encounter.  His grandson Martin Tanner interpreted and provided much of the history ?Provider: Office ?  ?I discussed the limitations of evaluation and management by telemedicine and the availability of in person appointments. The patient expressed understanding and agreed to proceed. ? ?History of Present Illness: ?Patient with history of dementia, Vit B12 def, vit D def, HTN, GERD, functional urinary incontinence, fecal incontinence, decreased hearing.  This was an urgent care visit for request of referral to the gastroenterologist. ? ?Patient seen at urgent care with [facility] 02/15/2022 with complaints of 1 month of diarrhea, weakness, fatigue and loss of appetite.  Vitals were stable with blood pressure of 120/61.  BMI was 16.24 with wgh 94 (on last visit with me 09/2021, weight was 97 pounds.).  H/H12/35, BUN 23/creatinine 1.55, albumin 39.  UA was negative.  Stool cultures and C. difficile were supposed to be obtained but looks like they were not done.  Patient sent home on Zofran and told to follow-up with PCP.  He was seen again in urgent care on 02/19/2022 for complaints of intermittent diarrhea.  Creatinine had improved to 1.34, GFR was 50. ? ?Today: ?Grandson reports that patient continues to have diarrhea.  He thinks the patient has an appointment with gastroenterologist 05/08/2022.  He has 3-5 bowel movements a day.  Bowel movements are watery.  No blood in the stools.  No abdominal pain.  He has decreased appetite.  Patient eats 1-3 meals a day.  He feeds himself.  He has not had any fever.  No travel outside of the [place].  He has a home health aide who comes in several days a week to assist with his care. ? ?Outpatient  Encounter Medications as of 05/04/2022  ?Medication Sig  ? diclofenac Sodium (VOLTAREN) 1 % GEL Apply 2 g topically 4 (four) times daily.  ? lisinopril (ZESTRIL) 5 MG tablet TAKE 1 TABLET(5 MG) BY MOUTH DAILY  ? vitamin B-12 (CYANOCOBALAMIN) 1000 MCG tablet Take 1 tablet (1,000 mcg total) by mouth daily.  ? ?No facility-administered encounter medications on file as of 05/04/2022.  ? ? ?  ?Observations/Objective: ?Patient is lying in bed.  He appears in no acute distress. ? ?Assessment and Plan: ?1. Chronic diarrhea ?2. Failure to thrive in adult ?-Patient with chronic diarrhea for the past 4 months.  I think it is definitely worth getting stool cultures and C. difficile.  Looks like he has an appointment scheduled with one of our providers for next Tuesday.  I would recommend getting those studies done. ?Advised purchasing some Ensure or boost shakes over-the-counter and giving him at least 2 cans a day to help supplement meals.  Make sure he drinks adequate fluids during the day to stay hydrated. ?I will send prescription to his pharmacy for some Imodium for him to use as needed for the diarrhea. ?Referral submitted to gastroenterology. ?- loperamide (IMODIUM A-D) 2 MG tablet; 1 tablet p.o. twice a day as needed for diarrhea.  Dispense: 30 tablet; Refill: 0 ? ? ? ?Follow Up Instructions: ?Keep upcoming appt next wk ?  ?I discussed the assessment and treatment plan with the patient. The patient was provided an opportunity to ask questions and all were answered. The patient  agreed with the plan and demonstrated an understanding of the instructions. ?  ?The patient was advised to call back or seek an in-person evaluation if the symptoms worsen or if the condition fails to improve as anticipated. ? ?I spent 19 minutes dedicated to the care of this patient on the date of this encounter to include previsit review of notes from his visit to urgent care in February of this year, review of labs from that visit, face-to-face  time with patient and his grandson discussing diagnosis and management and post visit entering of orders. ? ?This note has been created with Education officer, environmental. Any transcriptional errors are unintentional. ? ?Jonah Blue, MD ? ?

## 2022-05-07 NOTE — Progress Notes (Deleted)
   Established Patient Office Visit  Subjective   Patient ID: Martin Tanner, male    DOB: [birth year]  Age: 88 y.o. MRN: [id number]  No chief complaint on file.   Diarrhea, pain in feet 05/04/22 phone visit with johnson: History of Present Illness: Patient with history of dementia, Vit B12 def, vit D def, HTN, GERD, functional urinary incontinence, fecal incontinence, decreased hearing.  This was an urgent care visit for request of referral to the gastroenterologist.   Patient seen at urgent care with [facility] 02/15/2022 with complaints of 1 month of diarrhea, weakness, fatigue and loss of appetite.  Vitals were stable with blood pressure of 120/61.  BMI was 16.24 with wgh 94 (on last visit with me 09/2021, weight was 97 pounds.).  H/H12/35, BUN 23/creatinine 1.55, albumin 39.  UA was negative.  Stool cultures and C. difficile were supposed to be obtained but looks like they were not done.  Patient sent home on Zofran and told to follow-up with PCP.  He was seen again in urgent care on 02/19/2022 for complaints of intermittent diarrhea.  Creatinine had improved to 1.34, GFR was 50.   Today: Grandson reports that patient continues to have diarrhea.  He thinks the patient has an appointment with gastroenterologist 05/08/2022.  He has 3-5 bowel movements a day.  Bowel movements are watery.  No blood in the stools.  No abdominal pain.  He has decreased appetite.  Patient eats 1-3 meals a day.  He feeds himself.  He has not had any fever.  No travel outside of the [place].  He has a home health aide who comes in several days a week to assist with his care.  1. Chronic diarrhea 2. Failure to thrive in adult -Patient with chronic diarrhea for the past 4 months.  I think it is definitely worth getting stool cultures and C. difficile.  Looks like he has an appointment scheduled with one of our providers for next Tuesday.  I would recommend getting those studies done. Advised purchasing some Ensure or  boost shakes over-the-counter and giving him at least 2 cans a day to help supplement meals.  Make sure he drinks adequate fluids during the day to stay hydrated. I will send prescription to his pharmacy for some Imodium for him to use as needed for the diarrhea. Referral submitted to gastroenterology. - loperamide (IMODIUM A-D) 2 MG tablet; 1 tablet p.o. twice a day as needed for diarrhea.  Dispense: 30 tablet; Refill: 0     {History (Optional):23778}  ROS    Objective:     There were no vitals taken for this visit. {Vitals History (Optional):23777}  Physical Exam   No results found for any visits on 05/08/22.  {Labs (Optional):23779}  The ASCVD Risk score (Arnett DK, et al., 2019) failed to calculate for the following reasons:   The 2019 ASCVD risk score is only valid for ages 17 to 24    Assessment & Plan:   Problem List Items Addressed This Visit   None   No follow-ups on file.    Shan Levans, MD

## 2022-05-08 ENCOUNTER — Ambulatory Visit: Payer: Medicaid Other | Admitting: Critical Care Medicine

## 2022-06-14 ENCOUNTER — Other Ambulatory Visit: Payer: Self-pay | Admitting: Internal Medicine

## 2022-06-14 DIAGNOSIS — I10 Essential (primary) hypertension: Secondary | ICD-10-CM

## 2022-09-13 ENCOUNTER — Other Ambulatory Visit: Payer: Self-pay | Admitting: Internal Medicine

## 2022-09-13 DIAGNOSIS — I10 Essential (primary) hypertension: Secondary | ICD-10-CM

## 2022-09-13 NOTE — Telephone Encounter (Signed)
Requested Prescriptions  Pending Prescriptions Disp Refills  . lisinopril (ZESTRIL) 5 MG tablet [Pharmacy Med Name: LISINOPRIL 5MG  TABLETS] 90 tablet 0    Sig: TAKE 1 TABLET(5 MG) BY MOUTH DAILY     Cardiovascular:  ACE Inhibitors Failed - 09/13/2022  3:50 AM      Failed - Cr in normal range and within 180 days    Creat  Date Value Ref Range Status  02/23/2014 1.33 0.50 - 1.35 mg/dL Final   Creatinine, Ser  Date Value Ref Range Status  10/31/2021 1.36 (H) 0.76 - 1.27 mg/dL Final         Failed - K in normal range and within 180 days    Potassium  Date Value Ref Range Status  10/31/2021 5.0 3.5 - 5.2 mmol/L Final         Passed - Patient is not pregnant      Passed - Last BP in normal range    BP Readings from Last 1 Encounters:  10/31/21 138/81         Passed - Valid encounter within last 6 months    Recent Outpatient Visits          4 months ago Chronic diarrhea   [place], MD   10 months ago Essential hypertension   [place], Stephen L, RPH-CPP   11 months ago Pain in both [place] Ladell Pier, MD   1 year ago Encounter for family conference with patient present   [place] Ladell Pier, MD   1 year ago Vitamin B12 deficiency   [facility], Darrick Penna, MD

## 2022-12-15 ENCOUNTER — Other Ambulatory Visit: Payer: Self-pay | Admitting: Internal Medicine

## 2022-12-15 DIAGNOSIS — I10 Essential (primary) hypertension: Secondary | ICD-10-CM
